# Patient Record
Sex: Female | Born: 1935 | Race: White | Hispanic: No | Smoking: Former smoker
Health system: Southern US, Community
[De-identification: ages and names within clinical notes are randomized; demographics above are authoritative.]

## PROBLEM LIST (undated history)

## (undated) DIAGNOSIS — I4892 Unspecified atrial flutter: Secondary | ICD-10-CM

## (undated) DIAGNOSIS — Z9289 Personal history of other medical treatment: Secondary | ICD-10-CM

## (undated) DIAGNOSIS — I480 Paroxysmal atrial fibrillation: Secondary | ICD-10-CM

## (undated) DIAGNOSIS — M1990 Unspecified osteoarthritis, unspecified site: Secondary | ICD-10-CM

## (undated) DIAGNOSIS — I441 Atrioventricular block, second degree: Secondary | ICD-10-CM

## (undated) DIAGNOSIS — Z780 Asymptomatic menopausal state: Secondary | ICD-10-CM

## (undated) DIAGNOSIS — I739 Peripheral vascular disease, unspecified: Secondary | ICD-10-CM

## (undated) DIAGNOSIS — E785 Hyperlipidemia, unspecified: Secondary | ICD-10-CM

## (undated) DIAGNOSIS — I2510 Atherosclerotic heart disease of native coronary artery without angina pectoris: Secondary | ICD-10-CM

## (undated) DIAGNOSIS — C50919 Malignant neoplasm of unspecified site of unspecified female breast: Secondary | ICD-10-CM

## (undated) DIAGNOSIS — K5790 Diverticulosis of intestine, part unspecified, without perforation or abscess without bleeding: Secondary | ICD-10-CM

## (undated) DIAGNOSIS — I10 Essential (primary) hypertension: Secondary | ICD-10-CM

## (undated) DIAGNOSIS — I350 Nonrheumatic aortic (valve) stenosis: Secondary | ICD-10-CM

## (undated) HISTORY — PX: HEMORROIDECTOMY: SUR656

## (undated) HISTORY — DX: Unspecified atrial flutter: I48.92

## (undated) HISTORY — DX: Atherosclerotic heart disease of native coronary artery without angina pectoris: I25.10

## (undated) HISTORY — DX: Nonrheumatic aortic (valve) stenosis: I35.0

## (undated) HISTORY — DX: Asymptomatic menopausal state: Z78.0

## (undated) HISTORY — DX: Malignant neoplasm of unspecified site of unspecified female breast: C50.919

## (undated) HISTORY — PX: EYE SURGERY: SHX253

## (undated) HISTORY — DX: Unspecified osteoarthritis, unspecified site: M19.90

## (undated) HISTORY — PX: RENAL ARTERY STENT: SHX2321

## (undated) HISTORY — DX: Diverticulosis of intestine, part unspecified, without perforation or abscess without bleeding: K57.90

## (undated) HISTORY — DX: Hyperlipidemia, unspecified: E78.5

## (undated) HISTORY — DX: Atrioventricular block, second degree: I44.1

## (undated) HISTORY — PX: ARTERIOVENOUS GRAFT PLACEMENT W/ ENDOSCOPIC VEIN HARVEST: SUR1030

## (undated) HISTORY — DX: Essential (primary) hypertension: I10

## (undated) HISTORY — DX: Paroxysmal atrial fibrillation: I48.0

## (undated) HISTORY — DX: Peripheral vascular disease, unspecified: I73.9

## (undated) HISTORY — DX: Personal history of other medical treatment: Z92.89

## (undated) HISTORY — PX: BREAST SURGERY: SHX581

---
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
## 1998-04-23 ENCOUNTER — Ambulatory Visit (HOSPITAL_COMMUNITY): Admission: RE | Admit: 1998-04-23 | Discharge: 1998-04-23 | Payer: Self-pay | Admitting: Cardiology

## 1999-02-02 ENCOUNTER — Other Ambulatory Visit: Admission: RE | Admit: 1999-02-02 | Discharge: 1999-02-02 | Payer: Self-pay | Admitting: Gastroenterology

## 2005-09-27 HISTORY — PX: AORTIC VALVE REPLACEMENT: SHX41

## 2005-09-27 HISTORY — PX: CORONARY ARTERY BYPASS GRAFT: SHX141

## 2006-02-07 ENCOUNTER — Inpatient Hospital Stay (HOSPITAL_COMMUNITY): Admission: RE | Admit: 2006-02-07 | Discharge: 2006-02-19 | Payer: Self-pay | Admitting: Cardiothoracic Surgery

## 2006-02-07 ENCOUNTER — Encounter: Payer: Self-pay | Admitting: Cardiology

## 2006-03-10 ENCOUNTER — Encounter: Admission: RE | Admit: 2006-03-10 | Discharge: 2006-03-10 | Payer: Self-pay | Admitting: Cardiothoracic Surgery

## 2006-05-05 ENCOUNTER — Encounter: Admission: RE | Admit: 2006-05-05 | Discharge: 2006-05-05 | Payer: Self-pay | Admitting: Cardiothoracic Surgery

## 2006-07-28 ENCOUNTER — Encounter: Admission: RE | Admit: 2006-07-28 | Discharge: 2006-07-28 | Payer: Self-pay | Admitting: Cardiothoracic Surgery

## 2006-09-27 HISTORY — PX: MASTECTOMY: SHX3

## 2009-05-22 ENCOUNTER — Encounter: Payer: Self-pay | Admitting: Cardiology

## 2010-10-18 ENCOUNTER — Encounter: Payer: Self-pay | Admitting: Cardiothoracic Surgery

## 2011-10-19 ENCOUNTER — Encounter: Payer: Self-pay | Admitting: Gastroenterology

## 2011-11-09 ENCOUNTER — Encounter: Payer: Self-pay | Admitting: *Deleted

## 2011-11-09 ENCOUNTER — Ambulatory Visit: Payer: Self-pay | Admitting: Gastroenterology

## 2011-11-10 ENCOUNTER — Encounter: Payer: Self-pay | Admitting: *Deleted

## 2011-11-11 ENCOUNTER — Ambulatory Visit (INDEPENDENT_AMBULATORY_CARE_PROVIDER_SITE_OTHER): Payer: Medicare Other | Admitting: Cardiology

## 2011-11-11 ENCOUNTER — Encounter: Payer: Self-pay | Admitting: Cardiology

## 2011-11-11 VITALS — BP 169/70 | HR 64 | Wt 129.0 lb

## 2011-11-11 DIAGNOSIS — R609 Edema, unspecified: Secondary | ICD-10-CM

## 2011-11-11 DIAGNOSIS — E785 Hyperlipidemia, unspecified: Secondary | ICD-10-CM | POA: Insufficient documentation

## 2011-11-11 DIAGNOSIS — R600 Localized edema: Secondary | ICD-10-CM

## 2011-11-11 DIAGNOSIS — I2510 Atherosclerotic heart disease of native coronary artery without angina pectoris: Secondary | ICD-10-CM | POA: Insufficient documentation

## 2011-11-11 DIAGNOSIS — Z8679 Personal history of other diseases of the circulatory system: Secondary | ICD-10-CM | POA: Insufficient documentation

## 2011-11-11 DIAGNOSIS — I10 Essential (primary) hypertension: Secondary | ICD-10-CM

## 2011-11-11 DIAGNOSIS — Z954 Presence of other heart-valve replacement: Secondary | ICD-10-CM

## 2011-11-11 DIAGNOSIS — Z87448 Personal history of other diseases of urinary system: Secondary | ICD-10-CM

## 2011-11-11 DIAGNOSIS — Z953 Presence of xenogenic heart valve: Secondary | ICD-10-CM

## 2011-11-11 NOTE — Assessment & Plan Note (Signed)
Stable. No indication for echocardiography. 

## 2011-11-11 NOTE — Assessment & Plan Note (Signed)
I rechecked her blood pressure was 140 over about 85. Her creatinine is stable. I doubt renal vascular intervention would be helpful. I advised her to see Dr.Early of vascular surgery as scheduled in April by her primary care physician and Dr.McComb. I will not the renal dopplers here and did not feel there indicated.

## 2011-11-11 NOTE — Patient Instructions (Addendum)
Your physician wants you to follow-up in: 12 months.  You will receive a reminder letter in the mail two  months in advance. If you don't receive a letter, please call our office to schedule the follow-up appointment.  Your physician recommends that you return for lab work on 11/22/2011 for a BMET.

## 2011-11-11 NOTE — Assessment & Plan Note (Signed)
Stable. I've advised her to continue her current meds. As noted above, she has a history of coronary bypass surgery.

## 2011-11-11 NOTE — Assessment & Plan Note (Signed)
I advised her to take her Lasix on a daily basis. This should also help control her blood pressure.

## 2011-11-11 NOTE — Progress Notes (Signed)
HPI Stephanie Cochran is a 78 year old white female who comes today to establish with me as her cardiologist. She is a former patient of Dr. Lucas Mallow. She is referred by Dr Lucas Mallow. She has a history of severe aortic stenosis and coronary artery disease and is status post aortic valve replacement with a tissue valve and coronary bypass grafting x3 by Dr.Gerhardt [place] in May of 2007. She also has a history of renal artery stenosis and has had previous stenting, unknown details. She has a history previous tobacco use, hyperlipidemia, hypertension.  She is having no angina or symptoms of ischemia. She remains quite active. She likes to travel. She has no symptoms of palpitations. She does have a history of postoperative A. Fib and flutter.  She does not take her Lasix on a daily basis. He is also on hydrochlorothiazide. Her blood pressure then running around 160-180 which as it checked outside the office. She was told by Dr. Mayford Knife that she might need another stent in her kidney.  Of note, she had recent blood work by Dr.McComb. Her creatinine was normal 8.7 a, potassium 4.0, thyroid profile normal, total cholesterol 138, triglycerides 64, HDL 48, LDL 77.  Her biggest complaint today is that of swelling ankles and feet. She takes her Lasix when this happens. Past Medical History  Diagnosis Date  . CAD (coronary artery disease)   . HTN (hypertension)   . PVD (peripheral vascular disease)   . Diverticulosis     history  . HLD (hyperlipidemia)   . Atrial fibrillation     s/p operative  . Atrial flutter     s/p operative  . Cellulitis     left forearm  . Aortic stenosis     history  . Breast cancer     left mastectomy  . Post-menopausal   . Heart disease   . Diverticulosis   . Arthritis     Current Outpatient Prescriptions  Medication Sig Dispense Refill  . amLODipine (NORVASC) 10 MG tablet Take 1 tablet by mouth Daily.      Marland Kitchen aspirin 81 MG tablet Take 81 mg by mouth daily.      .  carvedilol (COREG) 6.25 MG tablet Take 6.25 mg by mouth daily.      . clopidogrel (PLAVIX) 75 MG tablet Take 75 mg by mouth daily.      . Cyanocobalamin (B-12) 1000 MCG CAPS Take 1 capsule by mouth daily.      . furosemide (LASIX) 20 MG tablet Take 20 mg by mouth as needed.      . hydrochlorothiazide (HYDRODIURIL) 25 MG tablet Take 12.5 mg by mouth daily.       Marland Kitchen letrozole (FEMARA) 2.5 MG tablet Take 2.5 mg by mouth daily.      . rosuvastatin (CRESTOR) 20 MG tablet Take 20 mg by mouth daily.      . Vitamin D, Ergocalciferol, (DRISDOL) 50000 UNITS CAPS Take 50,000 Units by mouth every 30 (thirty) days.      . vitamin E 400 UNIT capsule Take 400 Units by mouth daily.        Allergies  Allergen Reactions  . Penicillins     Family History  Problem Relation Age of Onset  . Colon cancer Sister   . Diabetes    . Heart disease    . Arthritis      History   Social History  . Marital Status: Widowed    Spouse Name: N/A    Number of  Children: N/A  . Years of Education: N/A   Occupational History  . Not on file.   Social History Main Topics  . Smoking status: Never Smoker   . Smokeless tobacco: Never Used  . Alcohol Use: No  . Drug Use: No  . Sexually Active: Not on file   Other Topics Concern  . Not on file   Social History Narrative  . No narrative on file    ROS ALL NEGATIVE EXCEPT THOSE NOTED IN HPI  PE  General Appearance: well developed, well nourished in no acute distress, elderly, looks younger than stated age HEENT: symmetrical face, PERRLA,  Neck: no JVD, thyromegaly, or adenopathy, trachea midline Chest: symmetric without deformity Cardiac: PMI non-displaced, RRR, normal S1, S2, no gallop, soft systolic murmur, S2 splitsLung: clear to ausculation and percussion Vascular: all pulses full without bruits  Abdominal: nondistended, nontender, good bowel sounds, no HSM, no bruits Extremities: no cyanosis, clubbing, 1+ pitting edema bilaterally, no sign of DVT, no  varicosities  Skin: normal color, no rashes Neuro: alert and oriented x 3, non-focal Pysch: normal affect  EKG Normal sinus rhythm, left axis deviation BMET No results found for this basename: na, k, cl, co2, glucose, bun, creatinine, calcium, gfrnonaa, gfraa    Lipid Panel  No results found for this basename: chol, trig, hdl, cholhdl, vldl, ldlcalc    CBC No results found for this basename: wbc, rbc, hgb, hct, plt, mcv, mch, mchc, rdw, neutrabs, lymphsabs, monoabs, eosabs, basosabs

## 2011-11-11 NOTE — Assessment & Plan Note (Signed)
At goal by recent blood work. No change in treatment

## 2011-11-11 NOTE — Assessment & Plan Note (Signed)
Under good control here in the office. Encouraged to take daily Lasix.

## 2011-11-12 ENCOUNTER — Ambulatory Visit: Payer: Self-pay | Admitting: Gastroenterology

## 2011-11-22 ENCOUNTER — Other Ambulatory Visit (INDEPENDENT_AMBULATORY_CARE_PROVIDER_SITE_OTHER): Payer: Medicare Other

## 2011-11-22 DIAGNOSIS — R609 Edema, unspecified: Secondary | ICD-10-CM

## 2011-11-22 LAB — BASIC METABOLIC PANEL
BUN: 15 mg/dL (ref 6–23)
CO2: 30 mEq/L (ref 19–32)
Calcium: 8.9 mg/dL (ref 8.4–10.5)
Sodium: 141 mEq/L (ref 135–145)

## 2011-12-21 ENCOUNTER — Encounter: Payer: Self-pay | Admitting: Cardiology

## 2012-01-03 ENCOUNTER — Encounter: Payer: Self-pay | Admitting: Vascular Surgery

## 2012-01-04 ENCOUNTER — Ambulatory Visit (INDEPENDENT_AMBULATORY_CARE_PROVIDER_SITE_OTHER): Payer: Medicare Other | Admitting: Vascular Surgery

## 2012-01-04 ENCOUNTER — Encounter: Payer: Self-pay | Admitting: Vascular Surgery

## 2012-01-04 VITALS — BP 210/68 | HR 53 | Temp 97.5°F | Resp 16

## 2012-01-04 DIAGNOSIS — I701 Atherosclerosis of renal artery: Secondary | ICD-10-CM | POA: Insufficient documentation

## 2012-01-04 DIAGNOSIS — Z48812 Encounter for surgical aftercare following surgery on the circulatory system: Secondary | ICD-10-CM

## 2012-01-04 DIAGNOSIS — I10 Essential (primary) hypertension: Secondary | ICD-10-CM

## 2012-01-04 DIAGNOSIS — N270 Small kidney, unilateral: Secondary | ICD-10-CM

## 2012-01-04 NOTE — Progress Notes (Signed)
[facility]   Patient name: Stephanie Cochran MRN: [id number] DOB: 15-Jan-1918 Sex: female   Referred by: Arelia Sneddon  Reason for referral:  Chief Complaint  Patient presents with  . New Evaluation    renal artery duplex 01-04-2012    . renal stenosis    HISTORY OF PRESENT ILLNESS: The patient is very active and 78 year old white female seen today for evaluation of history of renal artery stenosis and renal artery stenting in the past. Treatment had been at a [place] [facility]. Her vascular surgeon and retired she is seeing me for further discussion. She had undergone a right renal artery angioplasty and stenting in 2006. She had a current stenosis and underwent re\re intervention and stenting in 2010. I do have duplex ultrasounds for review from August 2011 and April 2012. B. 2012 study showed some critical stenosis in the right and critical stenosis in the left renal artery. He does have a lungs standing history of hypertension. She reports that there was never any response after her angioplasties in the past. She reports that her systolic blood pressure runs in the 160-180 range.  Past Medical History  Diagnosis Date  . CAD (coronary artery disease)   . HTN (hypertension)   . PVD (peripheral vascular disease)   . Diverticulosis     history  . HLD (hyperlipidemia)   . Atrial fibrillation     s/p operative  . Atrial flutter     s/p operative  . Cellulitis     left forearm  . Aortic stenosis     history  . Breast cancer     left mastectomy  . Post-menopausal   . Heart disease   . Diverticulosis   . Arthritis     Past Surgical History  Procedure Date  . Renal artery stent 2006, 2010  . Aortic valve replacement 2007    with 21mm [facility] pericardial tissue valve   . Coronary artery bypass graft 2007    x3, left internal mammary artery to the left anterior descending, saphenous vein graft tot he circumflex coronary  . Arteriovenous graft  placement w/ endoscopic vein harvest     left leg  . Mastectomy 2008    left, breast cancer, followed by a hematology oncologist  . Tonsillectomy 1943  . Hemorroidectomy     History   Social History  . Marital Status: Widowed    Spouse Name: N/A    Number of Children: N/A  . Years of Education: N/A   Occupational History  . Not on file.   Social History Main Topics  . Smoking status: Former Smoker    Quit date: 09/27/1960  . Smokeless tobacco: Never Used  . Alcohol Use: No  . Drug Use: No  . Sexually Active: Not on file   Other Topics Concern  . Not on file   Social History Narrative  . No narrative on file    Family History  Problem Relation Age of Onset  . Colon cancer Sister   . Diabetes    . Heart disease    . Arthritis      Allergies as of 01/04/2012 - Review Complete 01/04/2012  Allergen Reaction Noted  . Codeine  01/04/2012  . Penicillins    . Vicodin (hydrocodone-acetaminophen)  01/04/2012    Current Outpatient Prescriptions on File Prior to Visit  Medication Sig Dispense Refill  . amLODipine (NORVASC) 10 MG tablet Take 1 tablet by mouth Daily.      Marland Kitchen  aspirin 81 MG tablet Take 81 mg by mouth daily.      . carvedilol (COREG) 6.25 MG tablet Take 6.25 mg by mouth daily.      . clopidogrel (PLAVIX) 75 MG tablet Take 75 mg by mouth daily.      . Cyanocobalamin (B-12) 1000 MCG CAPS Take 1 capsule by mouth daily.      . furosemide (LASIX) 20 MG tablet Take 20 mg by mouth as needed.      . hydrochlorothiazide (HYDRODIURIL) 25 MG tablet Take 12.5 mg by mouth daily.       Marland Kitchen letrozole (FEMARA) 2.5 MG tablet Take 2.5 mg by mouth daily.      . rosuvastatin (CRESTOR) 20 MG tablet Take 20 mg by mouth daily.      . Vitamin D, Ergocalciferol, (DRISDOL) 50000 UNITS CAPS Take 50,000 Units by mouth every 30 (thirty) days.      . vitamin E 400 UNIT capsule Take 400 Units by mouth daily.         REVIEW OF SYSTEMS:  Positives indicated with an  "X"  CARDIOVASCULAR:  [ ]  chest pain   [ ]  chest pressure   [ ]  palpitations   [ ]  orthopnea   [x ] dyspnea on exertion   [ ]  claudication   [ ]  rest pain   [ ]  DVT   [ ]  phlebitis PULMONARY:   [ ]  productive cough   [ ]  asthma   [ ]  wheezing NEUROLOGIC:   [ ]  weakness  [x ] paresthesias  [ ]  aphasia  [ ]  amaurosis  [x ] dizziness HEMATOLOGIC:   [ ]  bleeding problems   [ ]  clotting disorders MUSCULOSKELETAL:  [ ]  joint pain   [ ]  joint swelling GASTROINTESTINAL: [ ]   blood in stool  [ ]   hematemesis GENITOURINARY:  [ ]   dysuria  [ ]   hematuria PSYCHIATRIC:  [ ]  history of major depression INTEGUMENTARY:  [ ]  rashes  [ ]  ulcers CONSTITUTIONAL:  [ ]  fever   [ ]  chills  PHYSICAL EXAMINATION:  General: The patient is a well-nourished female, in no acute distress. Vital signs are BP 210/68  Pulse 53  Temp 97.5 F (36.4 C)  Resp 16 Pulmonary: There is a good air exchange bilaterally without wheezing or rales. Abdomen: Soft and non-tender with normal pitch bowel sounds. No abdominal bruits heard Musculoskeletal: There are no major deformities.  There is no significant extremity pain. Neurologic: No focal weakness or paresthesias are detected, Skin: There are no ulcer or rashes noted. Pulses: 2+ radial femoral and dorsalis pedis pulses bilaterally Psychiatric: The patient has normal affect. Cardiovascular: There is a regular rate and rhythm without significant murmur appreciated.   [facility] Vascular Lab Studies:  Ordered and Independently Reviewed renal duplex today reveals elevated velocities in the right renal artery stent. It is patent but there is restenosis. In the left there was less than 60% stenosis of the left renal artery.  Impression and Plan:  [name] discussion with the patient. Did review her labs from approximately 2 months ago revealing a creatinine of 0.76. Her renal size is equal bilaterally. I explained in our practice is always been very conservative in treatment of renal  artery stenosis. I also explained that ligature supporting this more recently in that there has been a trend nationally poor more conservative treatment as well. I certainly would not recommend any re\re intervention in her right renal artery with normal renal function and stable essential  hypertension. I would not recommend any further followup duplex since we would not make any treatment recommendations based on this. The patient understands and will see [place] should she develop any renal insufficiency or other vascular problems    Keriann Rankin [facility] [place] Office: [phone]

## 2012-01-20 NOTE — Procedures (Unsigned)
RENAL ARTERY DUPLEX EVALUATION  INDICATION:  Renal artery stenosis, hypertension  HISTORY: Diabetes:  No Cardiac:  CAD, AV replacement Hypertension:  Yes Smoking:  No  RENAL ARTERY DUPLEX FINDINGS: Aorta-Proximal:  95 cm/s Aorta-Mid:  104 cm/s Aorta-Distal:  136 cm/s Celiac Artery Origin:  466 cm/s SMA Origin:  366 cm/s                                   RIGHT               LEFT Renal Artery Origin:             270 cm/s            170 cm/s Renal Artery Proximal:           211 cm/s            207 cm/s Renal Artery Mid:                172 cm/s            199 cm/s Renal Artery Distal:             100 cm/s            101 cm/s Hilar Acceleration Time (AT): Renal-Aortic Ratio (RAR):        2.8                 2.2 Kidney Size:                     11.2 cm             11.4 cm End Diastolic Ratio (EDR): Resistive Index (RI):            0.86                0.81  IMPRESSION: 1. Patent abdominal aorta with mild tortuosity and minimal     heterogeneous plaque present. 2. Elevated velocities present involving the celiac and SMA arteries     which is suggestive of greater than 75% stenosis. 3. Patent right renal artery stent with elevated velocities present.     No significant plaque formation identified. 4. Left renal artery patent with a ratio of less than 3.5 suggestive     of <60% stenosis. 5. Patent bilateral renal veins present.  ___________________________________________ Larina Earthly, M.D.  SH/MEDQ  D:  01/04/2012  T:  01/04/2012  Job:  [id number]

## 2012-08-28 ENCOUNTER — Telehealth: Payer: Self-pay | Admitting: Gastroenterology

## 2012-08-29 NOTE — Telephone Encounter (Signed)
Pt last seen for COLON 01/06/2004 that was normal except for diverticulosis. She had 2 appts with Dr Jarold Motto scheduled in 10/2011 that she cancelled because she was in [place] taking care of her sister. She lived in [place] for the last few years, but has moved back here now. She did have a COLON in [place] in the past few years, and the doctor said he would not do anymore d/t her age. Pt reports thin stools that resembled tiny worms yesterday that were black; today she states her BM was normal. Dr Jarold Motto, OK to schedule pt with [facility] even though she saw GI doc in the past years when she resided there? Thanks.

## 2012-08-29 NOTE — Telephone Encounter (Signed)
Informed pt Dr Jarold Motto prefers she see her PCP for her problem; pt stated understanding.

## 2012-08-29 NOTE — Telephone Encounter (Signed)
No...needs to see her doc at age 78

## 2012-12-01 ENCOUNTER — Ambulatory Visit: Payer: Medicare Other | Admitting: Cardiology

## 2012-12-05 ENCOUNTER — Ambulatory Visit (INDEPENDENT_AMBULATORY_CARE_PROVIDER_SITE_OTHER): Payer: Medicare Other | Admitting: Cardiology

## 2012-12-05 ENCOUNTER — Encounter: Payer: Self-pay | Admitting: Cardiology

## 2012-12-05 VITALS — BP 132/66 | HR 53 | Ht 61.0 in | Wt 125.0 lb

## 2012-12-05 NOTE — Patient Instructions (Addendum)
Continue taking your current medication as prescribed  Your physician recommends that you schedule a follow-up appointment in: 1 year.

## 2012-12-05 NOTE — Assessment & Plan Note (Signed)
Stable by history and exam. No indication for echocardiogram. Continue medical therapy.

## 2012-12-05 NOTE — Progress Notes (Signed)
HPI Stephanie Cochran returns today for evaluation and management of her history of coronary artery disease, bypass surgery, and history of aortic valve replacement. She also has renal artery disease and has had stenting. She remains on dual antiplatelet therapy.  She's having no angina or ischemic symptoms. She denies any significant dyspnea on exertion. Mobility is limited by back pain. She's had no presyncope or syncope.  Past Medical History  Diagnosis Date  . CAD (coronary artery disease)   . HTN (hypertension)   . PVD (peripheral vascular disease)   . Diverticulosis     history  . HLD (hyperlipidemia)   . Atrial fibrillation     s/p operative  . Atrial flutter     s/p operative  . Cellulitis     left forearm  . Aortic stenosis     history  . Breast cancer     left mastectomy  . Post-menopausal   . Heart disease   . Diverticulosis   . Arthritis     Current Outpatient Prescriptions  Medication Sig Dispense Refill  . amLODipine (NORVASC) 10 MG tablet Take 1 tablet by mouth Daily.      Marland Kitchen aspirin 81 MG tablet Take 81 mg by mouth daily.      . carvedilol (COREG) 6.25 MG tablet Take 6.25 mg by mouth daily.      . clopidogrel (PLAVIX) 75 MG tablet Take 75 mg by mouth daily.      . Cyanocobalamin (B-12) 1000 MCG CAPS Take 1 capsule by mouth daily.      . furosemide (LASIX) 20 MG tablet Take 20 mg by mouth as needed.      . hydrochlorothiazide (HYDRODIURIL) 25 MG tablet Take 12.5 mg by mouth daily.       Marland Kitchen letrozole (FEMARA) 2.5 MG tablet Take 2.5 mg by mouth daily.      . rosuvastatin (CRESTOR) 20 MG tablet Take 20 mg by mouth daily.      . Vitamin D, Ergocalciferol, (DRISDOL) 50000 UNITS CAPS Take 50,000 Units by mouth every 30 (thirty) days.      . vitamin E 400 UNIT capsule Take 400 Units by mouth daily.       No current facility-administered medications for this visit.    Allergies  Allergen Reactions  . Codeine   . Penicillins   . Vicodin (Hydrocodone-Acetaminophen)      Family History  Problem Relation Age of Onset  . Colon cancer Sister   . Diabetes    . Heart disease    . Arthritis      History   Social History  . Marital Status: Widowed    Spouse Name: N/A    Number of Children: N/A  . Years of Education: N/A   Occupational History  . Not on file.   Social History Main Topics  . Smoking status: Former Smoker    Quit date: 09/27/1960  . Smokeless tobacco: Never Used  . Alcohol Use: No  . Drug Use: No  . Sexually Active: Not on file   Other Topics Concern  . Not on file   Social History Narrative  . No narrative on file    ROS ALL NEGATIVE EXCEPT THOSE NOTED IN HPI  PE  General Appearance: well developed, well nourished in no acute distress HEENT: symmetrical face, PERRLA, good dentition  Neck: no JVD, thyromegaly, or adenopathy, trachea midline Chest: symmetric without deformity Cardiac: PMI non-displaced, RRR, normal S1, S2, no gallop, soft systolic murmur, S2 splits Lung: clear  to ausculation and percussion Vascular: Pulses diminished in the lower extremities but present,  Abdominal: nondistended, nontender, good bowel sounds, no HSM, no bruits Extremities: no cyanosis, clubbing or edema, no sign of DVT, no varicosities  Skin: normal color, no rashes Neuro: alert and oriented x 3, non-focal Pysch: normal affect  EKG  Sinus bradycardia with first-degree AV block. No acute changes.  BMET    Component Value Date/Time   NA 141 11/22/2011 1131   K 4.6 11/22/2011 1131   CL 107 11/22/2011 1131   CO2 30 11/22/2011 1131   GLUCOSE 97 11/22/2011 1131   BUN 15 11/22/2011 1131   CREATININE 0.7 11/22/2011 1131   CALCIUM 8.9 11/22/2011 1131    Lipid Panel  No results found for this basename: chol, trig, hdl, cholhdl, vldl, ldlcalc    CBC No results found for this basename: wbc, rbc, hgb, hct, plt, mcv, mch, mchc, rdw, neutrabs, lymphsabs, monoabs, eosabs, basosabs

## 2012-12-05 NOTE — Assessment & Plan Note (Signed)
Blood pressure under relatively good control stable renal function. Continue medical therapy as outlined by Dr.Early.

## 2012-12-05 NOTE — Assessment & Plan Note (Signed)
Stable. Continue medical therapy. Return the office in one year.

## 2013-03-18 ENCOUNTER — Ambulatory Visit: Payer: Medicare Other

## 2013-03-18 ENCOUNTER — Ambulatory Visit (INDEPENDENT_AMBULATORY_CARE_PROVIDER_SITE_OTHER): Payer: Medicare Other | Admitting: Family Medicine

## 2013-03-18 VITALS — BP 138/55 | HR 77 | Temp 98.3°F | Resp 18 | Wt 124.0 lb

## 2013-03-18 DIAGNOSIS — J209 Acute bronchitis, unspecified: Secondary | ICD-10-CM

## 2013-03-18 DIAGNOSIS — D649 Anemia, unspecified: Secondary | ICD-10-CM

## 2013-03-18 DIAGNOSIS — R05 Cough: Secondary | ICD-10-CM

## 2013-03-18 LAB — POCT CBC
Granulocyte percent: 46.7 %G (ref 37–80)
HCT, POC: 34.6 % — AB (ref 37.7–47.9)
Hemoglobin: 10.9 g/dL — AB (ref 12.2–16.2)
MID (cbc): 0.6 (ref 0–0.9)
MPV: 7.3 fL (ref 0–99.8)
POC Granulocyte: 2.1 (ref 2–6.9)
POC MID %: 13.9 %M — AB (ref 0–12)
Platelet Count, POC: 207 10*3/uL (ref 142–424)
RBC: 3.31 M/uL — AB (ref 4.04–5.48)

## 2013-03-18 MED ORDER — LEVOFLOXACIN 500 MG PO TABS: 500.0000 mg | ORAL_TABLET | Freq: Every day | ORAL | Status: DC

## 2013-03-18 NOTE — Progress Notes (Signed)
[address]   [place], [place]  [address]   [phone]  Subjective:    Patient ID: Stephanie Cochran, female    DOB: December 04, 1917, 79 y.o.   MRN: [id number]  HPI This 79 y.o. female presents for evaluation of cough.  Onset this week four days ago.  No fever but +sweats at night.  Mild headache.  +scratchy throat but not sore.  No ear pain.  No nasal congestion; +rhinorrhea drippy.  +coughing; history of pneumonia 07/2012, 10/2012.  Evaluated by [facility] today; recommended CXR.  Scant sputum production; stringy sputum.  Just returned from [place].  +DOE. No v/d.  No fatigue or dizziness.  Has not taken anything for cough.   Review of Systems  Constitutional: Positive for diaphoresis. Negative for fever, chills and fatigue.  HENT: Positive for rhinorrhea and voice change. Negative for ear pain, congestion, sore throat, sneezing, trouble swallowing and postnasal drip.   Respiratory: Positive for cough and shortness of breath. Negative for wheezing and stridor.   Gastrointestinal: Negative for nausea, vomiting, abdominal pain and diarrhea.  Skin: Negative for rash.  Neurological: Negative for dizziness, light-headedness and headaches.    Past Medical History  Diagnosis Date  . CAD (coronary artery disease)   . HTN (hypertension)   . PVD (peripheral vascular disease)   . Diverticulosis     history  . HLD (hyperlipidemia)   . Atrial fibrillation     s/p operative  . Atrial flutter     s/p operative  . Cellulitis     left forearm  . Aortic stenosis     history  . Breast cancer     left mastectomy  . Post-menopausal   . Heart disease   . Diverticulosis   . Arthritis     Past Surgical History  Procedure Laterality Date  . Renal artery stent  2006, 2010  . Aortic valve replacement  2007    with 21mm [facility] pericardial tissue valve   . Coronary artery bypass graft  2007    x3, left internal mammary artery to the left anterior descending, saphenous vein graft tot he  circumflex coronary  . Arteriovenous graft placement w/ endoscopic vein harvest      left leg  . Mastectomy  2008    left, breast cancer, followed by a hematology oncologist  . Tonsillectomy  1943  . Hemorroidectomy    . Breast surgery    . Eye surgery    . Cardiac valve replacement      Prior to Admission medications   Medication Sig Start Date End Date Taking? Authorizing Provider  amLODipine (NORVASC) 10 MG tablet Take 1 tablet by mouth Daily. 11/03/11  Yes Historical Provider, MD  aspirin 81 MG tablet Take 81 mg by mouth daily.   Yes Historical Provider, MD  carvedilol (COREG) 6.25 MG tablet Take 6.25 mg by mouth daily.   Yes Historical Provider, MD  clopidogrel (PLAVIX) 75 MG tablet Take 75 mg by mouth daily.   Yes Historical Provider, MD  Cyanocobalamin (B-12) 1000 MCG CAPS Take 1 capsule by mouth daily.   Yes Historical Provider, MD  furosemide (LASIX) 20 MG tablet Take 20 mg by mouth as needed.   Yes Historical Provider, MD  hydrochlorothiazide (HYDRODIURIL) 25 MG tablet Take 12.5 mg by mouth daily.    Yes Historical Provider, MD  letrozole (FEMARA) 2.5 MG tablet Take 2.5 mg by mouth daily.   Yes Historical Provider, MD  rosuvastatin (CRESTOR) 20 MG tablet  Take 20 mg by mouth daily.   Yes Historical Provider, MD  Vitamin D, Ergocalciferol, (DRISDOL) 50000 UNITS CAPS Take 50,000 Units by mouth every 30 (thirty) days.   Yes Historical Provider, MD  vitamin E 400 UNIT capsule Take 400 Units by mouth daily.   Yes Historical Provider, MD  levofloxacin (LEVAQUIN) 500 MG tablet Take 1 tablet (500 mg total) by mouth daily. 03/18/13   Ethelda Chick, MD    Allergies  Allergen Reactions  . Codeine   . Penicillins   . Vicodin (Hydrocodone-Acetaminophen)     History   Social History  . Marital Status: Widowed    Spouse Name: N/A    Number of Children: N/A  . Years of Education: N/A   Occupational History  . Not on file.   Social History Main Topics  . Smoking status: Former  Smoker    Quit date: 09/27/1960  . Smokeless tobacco: Never Used  . Alcohol Use: No  . Drug Use: No  . Sexually Active: Not on file   Other Topics Concern  . Not on file   Social History Narrative  . No narrative on file    Family History  Problem Relation Age of Onset  . Colon cancer Sister   . Diabetes    . Heart disease    . Arthritis         Objective:   Physical Exam  Nursing note and vitals reviewed. Constitutional: She is oriented to person, place, and time. She appears well-developed and well-nourished. No distress.  HENT:  Head: Normocephalic and atraumatic.  Right Ear: External ear normal.  Left Ear: External ear normal.  Nose: Nose normal.  Mouth/Throat: Oropharynx is clear and moist.  Eyes: Conjunctivae are normal. Pupils are equal, round, and reactive to light.  Neck: Normal range of motion. Neck supple. No thyromegaly present.  Cardiovascular: Normal rate and regular rhythm.   Murmur heard. Pulmonary/Chest: Effort normal. She has no wheezes. She has rhonchi. She has no rales.  Lymphadenopathy:    She has no cervical adenopathy.  Neurological: She is alert and oriented to person, place, and time.  Skin: Skin is warm and dry. No rash noted. She is not diaphoretic.  Psychiatric: She has a normal mood and affect. Her behavior is normal.          Results for orders placed in visit on 03/18/13  POCT CBC      Result Value Range   WBC 4.4 (*) 4.6 - 10.2 K/uL   Lymph, poc 1.7  0.6 - 3.4   POC LYMPH PERCENT 39.4  10 - 50 %L   MID (cbc) 0.6  0 - 0.9   POC MID % 13.9 (*) 0 - 12 %M   POC Granulocyte 2.1  2 - 6.9   Granulocyte percent 46.7  37 - 80 %G   RBC 3.31 (*) 4.04 - 5.48 M/uL   Hemoglobin 10.9 (*) 12.2 - 16.2 g/dL   HCT, POC 14.7 (*) 82.9 - 47.9 %   MCV 104.5 (*) 80 - 97 fL   MCH, POC 32.9 (*) 27 - 31.2 pg   MCHC 31.5 (*) 31.8 - 35.4 g/dL   RDW, POC 56.2     Platelet Count, POC 207  142 - 424 K/uL   MPV 7.3  0 - 99.8 fL   [facility] reading  (PRIMARY) by  Dr. Katrinka Blazing. CXR: NAD; +sternotomy wires.   Assessment & Plan:  Cough - Plan: DG Chest 2 View, POCT  CBC  Acute bronchitis - Plan: levofloxacin (LEVAQUIN) 500 MG tablet  Anemia   1.  Acute Bronchitis:  New.  With history of pneumonia twice in past six months; rx for Levaquin provided.  Start Mucinex. 2.  Anemia: New to this provider; not sure if new for patient; asymptomatic; recommend follow-up with PCP and to clarify if she has suffered with anemia in the past.  Meds ordered this encounter  Medications  . levofloxacin (LEVAQUIN) 500 MG tablet    Sig: Take 1 tablet (500 mg total) by mouth daily.    Dispense:  7 tablet    Refill:  0

## 2013-12-18 ENCOUNTER — Ambulatory Visit: Payer: Medicare Other | Admitting: Cardiology

## 2014-04-22 ENCOUNTER — Encounter: Payer: Self-pay | Admitting: Physician Assistant

## 2014-04-22 ENCOUNTER — Ambulatory Visit
Admission: RE | Admit: 2014-04-22 | Discharge: 2014-04-22 | Disposition: A | Discharge status: Home / Self Care | Payer: Medicare Other | Source: Ambulatory Visit | Attending: Physician Assistant | Admitting: Physician Assistant

## 2014-04-22 ENCOUNTER — Ambulatory Visit (INDEPENDENT_AMBULATORY_CARE_PROVIDER_SITE_OTHER): Payer: Medicare Other | Admitting: Physician Assistant

## 2014-04-22 VITALS — BP 163/60 | HR 52 | Ht 61.0 in | Wt 119.1 lb

## 2014-04-22 DIAGNOSIS — R0602 Shortness of breath: Secondary | ICD-10-CM

## 2014-04-22 DIAGNOSIS — I10 Essential (primary) hypertension: Secondary | ICD-10-CM

## 2014-04-22 DIAGNOSIS — Z87448 Personal history of other diseases of urinary system: Secondary | ICD-10-CM

## 2014-04-22 DIAGNOSIS — E785 Hyperlipidemia, unspecified: Secondary | ICD-10-CM

## 2014-04-22 DIAGNOSIS — I2510 Atherosclerotic heart disease of native coronary artery without angina pectoris: Secondary | ICD-10-CM

## 2014-04-22 DIAGNOSIS — Z953 Presence of xenogenic heart valve: Secondary | ICD-10-CM

## 2014-04-22 DIAGNOSIS — Z952 Presence of prosthetic heart valve: Secondary | ICD-10-CM

## 2014-04-22 DIAGNOSIS — Z8679 Personal history of other diseases of the circulatory system: Secondary | ICD-10-CM

## 2014-04-22 LAB — D-DIMER, QUANTITATIVE: D-Dimer, Quant: 1.37 ug/mL-FEU — ABNORMAL HIGH (ref 0.00–0.48)

## 2014-04-22 MED ORDER — OLMESARTAN MEDOXOMIL 20 MG PO TABS: 20.0000 mg | ORAL_TABLET | Freq: Every day | ORAL | Status: DC

## 2014-04-22 MED ORDER — ISOSORBIDE MONONITRATE ER 30 MG PO TB24: 15.0000 mg | ORAL_TABLET | Freq: Every day | ORAL | Status: DC

## 2014-04-22 NOTE — Patient Instructions (Signed)
Your physician has recommended you make the following change in your medication: 1. Start Imdur 15 MG 1 tablet daily  Your physician recommends that you go to the lab today for a D-Dimer, BMET,CBC,TSH, and BNP  Your physician has requested that you have an echocardiogram. Echocardiography is a painless test that uses sound waves to create images of your heart. It provides your doctor with information about the size and shape of your heart and how well your heart's chambers and valves are working. This procedure takes approximately one hour. There are no restrictions for this procedure.  Your physician has requested that you have a lexiscan myoview. For further information please visit [email or web address]. Please follow instruction sheet, as given.  A chest x-ray takes a picture of the organs and structures inside the chest, including the heart, lungs, and blood vessels. This test can show several things, including, whether the heart is enlarges; whether fluid is building up in the lungs; and whether pacemaker / defibrillator leads are still in place. (This will be done at [facility] in the [employer]. It is a Walk in facility. You do not need appt.)  Your physician recommends that you schedule a follow-up appointment in: 3 weeks with Dr Meda Coffee or on a day with Nicki Reaper with Dr Meda Coffee in office.

## 2014-04-22 NOTE — Progress Notes (Signed)
Cardiology Office Note    Date:  04/22/2014   ID:  Stephanie Cochran, DOB Mar 24, 1918, MRN [id number]  PCP:  Darlyn Chamber, MD  Cardiologist:  Dr. Jenell Milliner >>> Dr. Ena Dawley      History of Present Illness: Stephanie Cochran is a 80 y.o. female with a hx of CAD and severe AS s/p CABG  X 3 and bioprosthetic AVR in 01/2006 (Dr. Servando Snare in [facility]), RAS s/p prior stenting, HTN, HL, tobacco abuse.  Previously followed by Dr. Pauline Aus.  Established with Dr. Jenell Milliner in 2013.  Last seen in 11/2012.  She returns for evaluation of dyspnea.  She was in [place] on vacation 1 month ago.  She started to feel short of breath with certain activities while there.  She is probably NYHA 2b-3.  She denies any worsening symptoms.  She denies syncope. She denies chest pain.  She sleeps on an incline chronically without change.  She denies PND.  She has stable LE edema without significant change.  She denies wheezing.  She has a cough with scant yellow sputum production.  No hemoptysis.  No fever.     Studies:  - Echo (04/2009 - [facility] in [place], [place]):  Mild LVH, normal LVF, anterior WMA, AVR ok (mean 4 mmHg), MAC, mild MR, mild TV stenosis, RVSP 44 mmHg  - Abdominal US (4/13):  Celiac and SMA velocities elevated (> 75% stenosis)  [facility] Screen done 01/2014 (records brought in by patient):  Carotid US with bilateral < 50% stenosis, abdominal US neg for AAA, ABIs with R 0.8, L 0.82   Recent Labs: No results found for requested labs within last 365 days.  Wt Readings from Last 3 Encounters:  03/18/13 124 lb (56.246 kg)  12/05/12 125 lb (56.7 kg)  11/11/11 129 lb (58.514 kg)     Past Medical History  Diagnosis Date  . CAD (coronary artery disease)     s/p CABG 2007 at [facility]  . HTN (hypertension)   . PVD (peripheral vascular disease)   . Diverticulosis     history  . HLD (hyperlipidemia)   . Atrial fibrillation     post operative  . Atrial flutter     post operative  . Cellulitis       left forearm  . Aortic stenosis     a. s/p bioprosthetic AVR 2007 [facility]);  b. Echo (04/2009 - [facility] in [place], [place]):  Mild LVH, normal LVF, anterior WMA, AVR ok (mean 4 mmHg), MAC, mild MR, mild TV stenosis, RVSP 44 mmHg  . Breast cancer     left mastectomy  . Post-menopausal   . Arthritis     Current Outpatient Prescriptions  Medication Sig Dispense Refill  . amLODipine (NORVASC) 10 MG tablet Take 1 tablet by mouth Daily.      Marland Kitchen aspirin 81 MG tablet Take 81 mg by mouth daily.      . carvedilol (COREG) 6.25 MG tablet Take 6.25 mg by mouth daily.      . clopidogrel (PLAVIX) 75 MG tablet Take 75 mg by mouth daily.      . Cyanocobalamin (B-12) 1000 MCG CAPS Take 1 capsule by mouth daily.      . furosemide (LASIX) 20 MG tablet Take 20 mg by mouth as needed.      . hydrochlorothiazide (HYDRODIURIL) 25 MG tablet Take 12.5 mg by mouth daily.       Marland Kitchen letrozole (FEMARA) 2.5 MG tablet Take 2.5 mg  by mouth daily.      Marland Kitchen levofloxacin (LEVAQUIN) 500 MG tablet Take 1 tablet (500 mg total) by mouth daily.  7 tablet  0  . rosuvastatin (CRESTOR) 20 MG tablet Take 20 mg by mouth daily.      . Vitamin D, Ergocalciferol, (DRISDOL) 50000 UNITS CAPS Take 50,000 Units by mouth every 30 (thirty) days.      . vitamin E 400 UNIT capsule Take 400 Units by mouth daily.       No current facility-administered medications for this visit.    Allergies:   Codeine; Penicillins; and Vicodin   Social History:  The patient  reports that she quit smoking about 53 years ago. She has never used smokeless tobacco. She reports that she does not drink alcohol or use illicit drugs.   Family History:  The patient's family history includes Arthritis in an other family member; Colon cancer in her sister; Diabetes in an other family member; Heart disease in an other family member.   ROS:  Please see the history of present illness.      All other systems reviewed and negative.   PHYSICAL EXAM: VS:  BP 163/60  Pulse  52  Ht 5\' 1"  (1.549 m)  Wt 119 lb 1.9 oz (54.032 kg)  BMI 22.52 kg/m2  SpO2 98% Well nourished, well developed, in no acute distress HEENT: normal Neck: no JVD Cardiac:  normal S1, S2; RRR; 2/6 systolic murmurRUSB Lungs:  clear to auscultation bilaterally, no wheezing, rhonchi or rales Abd: soft, nontender, no hepatomegaly Ext: trace to 1+ bilateral ankle edema Skin: warm and dry Neuro:  CNs 2-12 intact, no focal abnormalities noted  EKG:  Sinus brady, HR 52, LAD, 1st degree AVB, no change from prior tracing    ASSESSMENT AND PLAN:  SOB (shortness of breath):  Etiology not entirely clear.  Differential Dx is extensive.  Exam does not suggest significantly worsening aortic stenosis or significant volume excess.  No chest pain but dyspnea may be an anginal equivalent.  She developed symptoms after a long [place] flight.  She is a vibrant 80 year old who still lives alone and remains very active.  She would certainly be a candidate for cardiac cath if it was warranted.    -  Check BMET, BNP, CBC, TSH, DDimer.  If DDimer elevated, she will need Chest CTA to rule PE.   -  Arrange CXR   -  Arrange F/u Echo to assess AV gradient and LVF.   -  Arrange Lexiscan Myoview to assess for ischemia.     -  Add Imdur 15 mg QD to initiate medical Rx for CAD.  Coronary artery disease:  Arrange Myoview and start Isosorbide as noted.  Continue ASA, beta blocker.  She is not on statin Rx for unclear reasons.  We can address this at follow up.  History of aortic valve replacement with bioprosthetic valve - Plan: 2D Echocardiogram without contrast  History of renal artery stenosis:  Previously followed by [facility].  If creatinine elevated, consider repeat US.    Essential hypertension:  BP elevated.  Add Nitrates as noted.  Monitor.   Hyperlipidemia:  Will revisit at follow up and start statin if no contraindication.     Disposition:  F/u with Dr. Ena Dawley or me in 3 weeks.     Signed, [name], MHS 04/22/2014 2:25 PM    [facility] [address], [place], [place]  [address] Phone: [phone];  Fax: [phone]

## 2014-04-23 ENCOUNTER — Telehealth: Payer: Self-pay | Admitting: *Deleted

## 2014-04-23 ENCOUNTER — Ambulatory Visit (INDEPENDENT_AMBULATORY_CARE_PROVIDER_SITE_OTHER)
Admission: RE | Admit: 2014-04-23 | Discharge: 2014-04-23 | Disposition: A | Discharge status: Home / Self Care | Payer: Medicare Other | Source: Ambulatory Visit | Attending: Physician Assistant | Admitting: Physician Assistant

## 2014-04-23 DIAGNOSIS — R7989 Other specified abnormal findings of blood chemistry: Secondary | ICD-10-CM

## 2014-04-23 DIAGNOSIS — R0602 Shortness of breath: Secondary | ICD-10-CM

## 2014-04-23 DIAGNOSIS — R791 Abnormal coagulation profile: Secondary | ICD-10-CM

## 2014-04-23 LAB — CBC
HCT: 34.9 % — ABNORMAL LOW (ref 36.0–46.0)
HEMOGLOBIN: 11.7 g/dL — AB (ref 12.0–15.0)
MCHC: 33.5 g/dL (ref 30.0–36.0)
MCV: 103 fl — ABNORMAL HIGH (ref 78.0–100.0)
PLATELETS: 214 10*3/uL (ref 150.0–400.0)
RBC: 3.39 Mil/uL — AB (ref 3.87–5.11)
RDW: 13.9 % (ref 11.5–15.5)
WBC: 5.7 10*3/uL (ref 4.0–10.5)

## 2014-04-23 LAB — TSH: TSH: 3.79 u[IU]/mL (ref 0.35–4.50)

## 2014-04-23 LAB — BASIC METABOLIC PANEL
BUN: 23 mg/dL (ref 6–23)
CHLORIDE: 97 meq/L (ref 96–112)
CO2: 27 mEq/L (ref 19–32)
CREATININE: 1 mg/dL (ref 0.4–1.2)
Calcium: 9.3 mg/dL (ref 8.4–10.5)
GFR: 58.15 mL/min — AB (ref >60.00)
GLUCOSE: 112 mg/dL — AB (ref 70–99)
POTASSIUM: 4.8 meq/L (ref 3.5–5.1)
Sodium: 131 mEq/L — ABNORMAL LOW (ref 135–145)

## 2014-04-23 LAB — BRAIN NATRIURETIC PEPTIDE: Pro B Natriuretic peptide (BNP): 255 pg/mL — ABNORMAL HIGH (ref 0.0–100.0)

## 2014-04-23 MED ORDER — IOHEXOL 350 MG/ML SOLN
70.0000 mL | Freq: Once | INTRAVENOUS | Status: AC | PRN
  Administered 2014-04-23: 70 mL via INTRAVENOUS

## 2014-04-23 NOTE — Telephone Encounter (Signed)
pt notified about lab results also with elevated d-dimer 1.37. Pt scheduled today for Chest Ct to r/o PE. Pt's neice also notified since pt's daughter lives out of town and neice brings her to appts. Rose in [place] scheduled pt for 2 pm today, pt aware NPO after 12 noon today.

## 2014-04-24 ENCOUNTER — Telehealth: Payer: Self-pay | Admitting: *Deleted

## 2014-04-24 DIAGNOSIS — R600 Localized edema: Secondary | ICD-10-CM

## 2014-04-24 MED ORDER — FUROSEMIDE 20 MG PO TABS: ORAL_TABLET | ORAL | Status: DC

## 2014-04-24 NOTE — Telephone Encounter (Signed)
ptcb and has been notified about all test results with verbal understanding. Pt aware to start lasix 20 mg 3 x weekly, will get bmet 8/12 when she comes back from vacation.

## 2014-04-24 NOTE — Telephone Encounter (Signed)
pt notified about all test results with verbal understanding and to start taking lasix 20 mg 3 days a week. Pt said she will get bmet 8/12 when she is back from vacation.

## 2014-04-24 NOTE — Telephone Encounter (Signed)
lmptcb for test results from lab, cxr and chest ct

## 2014-04-25 ENCOUNTER — Encounter: Payer: Medicare Other | Admitting: Cardiology

## 2014-05-01 ENCOUNTER — Encounter: Payer: Medicare Other | Admitting: Cardiology

## 2014-05-07 ENCOUNTER — Other Ambulatory Visit (INDEPENDENT_AMBULATORY_CARE_PROVIDER_SITE_OTHER): Payer: Medicare Other

## 2014-05-07 DIAGNOSIS — R609 Edema, unspecified: Secondary | ICD-10-CM

## 2014-05-07 DIAGNOSIS — R600 Localized edema: Secondary | ICD-10-CM

## 2014-05-07 LAB — BASIC METABOLIC PANEL
BUN: 21 mg/dL (ref 6–23)
CALCIUM: 8.9 mg/dL (ref 8.4–10.5)
CO2: 22 mEq/L (ref 19–32)
CREATININE: 1.1 mg/dL (ref 0.4–1.2)
Chloride: 99 mEq/L (ref 96–112)
GFR: 50.69 mL/min — AB (ref >60.00)
Glucose, Bld: 105 mg/dL — ABNORMAL HIGH (ref 70–99)
Potassium: 3.7 mEq/L (ref 3.5–5.1)
Sodium: 133 mEq/L — ABNORMAL LOW (ref 135–145)

## 2014-05-08 ENCOUNTER — Telehealth: Payer: Self-pay | Admitting: *Deleted

## 2014-05-08 NOTE — Telephone Encounter (Signed)
pt notified about lab results with verbal understanding and asked for results to be mailed to her today.

## 2014-05-13 ENCOUNTER — Encounter (HOSPITAL_COMMUNITY): Payer: Self-pay | Admitting: Emergency Medicine

## 2014-05-13 ENCOUNTER — Emergency Department (HOSPITAL_COMMUNITY): Payer: Medicare Other

## 2014-05-13 ENCOUNTER — Ambulatory Visit (HOSPITAL_BASED_OUTPATIENT_CLINIC_OR_DEPARTMENT_OTHER): Payer: Medicare Other | Admitting: Cardiology

## 2014-05-13 ENCOUNTER — Inpatient Hospital Stay (HOSPITAL_COMMUNITY)
Admission: EM | Admit: 2014-05-13 | Discharge: 2014-05-15 | DRG: 244 | Disposition: A | Discharge status: Home / Self Care | Payer: Medicare Other | Attending: Cardiovascular Disease | Admitting: Cardiovascular Disease

## 2014-05-13 ENCOUNTER — Encounter: Payer: Self-pay | Admitting: Cardiology

## 2014-05-13 ENCOUNTER — Ambulatory Visit (HOSPITAL_COMMUNITY): Payer: Medicare Other | Admitting: Radiology

## 2014-05-13 VITALS — Ht 61.0 in | Wt 118.0 lb

## 2014-05-13 DIAGNOSIS — I10 Essential (primary) hypertension: Secondary | ICD-10-CM | POA: Diagnosis present

## 2014-05-13 DIAGNOSIS — I441 Atrioventricular block, second degree: Principal | ICD-10-CM | POA: Diagnosis present

## 2014-05-13 DIAGNOSIS — Z87891 Personal history of nicotine dependence: Secondary | ICD-10-CM | POA: Diagnosis not present

## 2014-05-13 DIAGNOSIS — R0602 Shortness of breath: Secondary | ICD-10-CM

## 2014-05-13 DIAGNOSIS — D649 Anemia, unspecified: Secondary | ICD-10-CM | POA: Diagnosis present

## 2014-05-13 DIAGNOSIS — Z7982 Long term (current) use of aspirin: Secondary | ICD-10-CM

## 2014-05-13 DIAGNOSIS — I442 Atrioventricular block, complete: Secondary | ICD-10-CM | POA: Diagnosis present

## 2014-05-13 DIAGNOSIS — Z7902 Long term (current) use of antithrombotics/antiplatelets: Secondary | ICD-10-CM

## 2014-05-13 DIAGNOSIS — C50919 Malignant neoplasm of unspecified site of unspecified female breast: Secondary | ICD-10-CM | POA: Diagnosis present

## 2014-05-13 DIAGNOSIS — Z951 Presence of aortocoronary bypass graft: Secondary | ICD-10-CM

## 2014-05-13 DIAGNOSIS — Z79899 Other long term (current) drug therapy: Secondary | ICD-10-CM

## 2014-05-13 DIAGNOSIS — I2510 Atherosclerotic heart disease of native coronary artery without angina pectoris: Secondary | ICD-10-CM

## 2014-05-13 DIAGNOSIS — Z9010 Acquired absence of unspecified breast and nipple: Secondary | ICD-10-CM | POA: Diagnosis not present

## 2014-05-13 DIAGNOSIS — Z952 Presence of prosthetic heart valve: Secondary | ICD-10-CM | POA: Diagnosis not present

## 2014-05-13 DIAGNOSIS — E785 Hyperlipidemia, unspecified: Secondary | ICD-10-CM | POA: Diagnosis present

## 2014-05-13 DIAGNOSIS — Z953 Presence of xenogenic heart valve: Secondary | ICD-10-CM

## 2014-05-13 DIAGNOSIS — Z954 Presence of other heart-valve replacement: Secondary | ICD-10-CM

## 2014-05-13 LAB — URINE MICROSCOPIC-ADD ON

## 2014-05-13 LAB — PRO B NATRIURETIC PEPTIDE: PRO B NATRI PEPTIDE: 1155 pg/mL — AB (ref 0–450)

## 2014-05-13 LAB — URINALYSIS, ROUTINE W REFLEX MICROSCOPIC
BILIRUBIN URINE: NEGATIVE
Glucose, UA: NEGATIVE mg/dL
KETONES UR: NEGATIVE mg/dL
NITRITE: NEGATIVE
PH: 6.5 (ref 5.0–8.0)
PROTEIN: 30 mg/dL — AB
Specific Gravity, Urine: 1.012 (ref 1.005–1.030)
Urobilinogen, UA: 0.2 mg/dL (ref 0.0–1.0)

## 2014-05-13 LAB — SURGICAL PCR SCREEN
MRSA, PCR: NEGATIVE
Staphylococcus aureus: NEGATIVE

## 2014-05-13 LAB — CBC WITH DIFFERENTIAL/PLATELET
BASOS ABS: 0 10*3/uL (ref 0.0–0.1)
Basophils Relative: 1 % (ref 0–1)
EOS PCT: 3 % (ref 0–5)
Eosinophils Absolute: 0.2 10*3/uL (ref 0.0–0.7)
HCT: 34.8 % — ABNORMAL LOW (ref 36.0–46.0)
Hemoglobin: 12.3 g/dL (ref 12.0–15.0)
LYMPHS ABS: 2.4 10*3/uL (ref 0.7–4.0)
LYMPHS PCT: 39 % (ref 12–46)
MCH: 35 pg — ABNORMAL HIGH (ref 26.0–34.0)
MCHC: 35.3 g/dL (ref 30.0–36.0)
MCV: 99.1 fL (ref 78.0–100.0)
Monocytes Absolute: 0.8 10*3/uL (ref 0.1–1.0)
Monocytes Relative: 13 % — ABNORMAL HIGH (ref 3–12)
NEUTROS ABS: 2.8 10*3/uL (ref 1.7–7.7)
NEUTROS PCT: 46 % (ref 43–77)
PLATELETS: 212 10*3/uL (ref 150–400)
RBC: 3.51 MIL/uL — AB (ref 3.87–5.11)
RDW: 13.4 % (ref 11.5–15.5)
WBC: 6.1 10*3/uL (ref 4.0–10.5)

## 2014-05-13 LAB — BASIC METABOLIC PANEL
ANION GAP: 14 (ref 5–15)
BUN: 25 mg/dL — ABNORMAL HIGH (ref 6–23)
CALCIUM: 9.5 mg/dL (ref 8.4–10.5)
CO2: 23 meq/L (ref 19–32)
Chloride: 97 mEq/L (ref 96–112)
Creatinine, Ser: 0.8 mg/dL (ref 0.50–1.10)
GFR calc Af Amer: 71 mL/min — ABNORMAL LOW (ref >90)
GFR calc non Af Amer: 61 mL/min — ABNORMAL LOW (ref >90)
Glucose, Bld: 105 mg/dL — ABNORMAL HIGH (ref 70–99)
POTASSIUM: 4.1 meq/L (ref 3.7–5.3)
SODIUM: 134 meq/L — AB (ref 137–147)

## 2014-05-13 LAB — COMPREHENSIVE METABOLIC PANEL
ALBUMIN: 3.8 g/dL (ref 3.5–5.2)
ALT: 9 U/L (ref 0–35)
ANION GAP: 14 (ref 5–15)
AST: 23 U/L (ref 0–37)
Alkaline Phosphatase: 44 U/L (ref 39–117)
BILIRUBIN TOTAL: 0.5 mg/dL (ref 0.3–1.2)
BUN: 25 mg/dL — AB (ref 6–23)
CHLORIDE: 101 meq/L (ref 96–112)
CO2: 20 mEq/L (ref 19–32)
CREATININE: 0.76 mg/dL (ref 0.50–1.10)
Calcium: 9 mg/dL (ref 8.4–10.5)
GFR calc Af Amer: 81 mL/min — ABNORMAL LOW (ref >90)
GFR calc non Af Amer: 70 mL/min — ABNORMAL LOW (ref >90)
GLUCOSE: 95 mg/dL (ref 70–99)
POTASSIUM: 4.4 meq/L (ref 3.7–5.3)
Sodium: 135 mEq/L — ABNORMAL LOW (ref 137–147)
TOTAL PROTEIN: 6.5 g/dL (ref 6.0–8.3)

## 2014-05-13 LAB — I-STAT TROPONIN, ED: TROPONIN I, POC: 0.01 ng/mL (ref 0.00–0.08)

## 2014-05-13 LAB — I-STAT CG4 LACTIC ACID, ED: Lactic Acid, Venous: 1.14 mmol/L (ref 0.5–2.2)

## 2014-05-13 LAB — TROPONIN I: Troponin I: <0.3 ng/mL (ref <0.30)

## 2014-05-13 MED ORDER — IRBESARTAN 75 MG PO TABS
75.0000 mg | ORAL_TABLET | Freq: Every day | ORAL | Status: DC
Start: 2014-05-13 — End: 2014-05-15
  Administered 2014-05-13 – 2014-05-15 (×3): 75 mg via ORAL
  Filled 2014-05-13 (×3): qty 1

## 2014-05-13 MED ORDER — SODIUM CHLORIDE 0.9 % IV SOLN
INTRAVENOUS | Status: DC
  Administered 2014-05-13: 20:00:00 via INTRAVENOUS

## 2014-05-13 MED ORDER — VITAMIN C 500 MG PO TABS
1000.0000 mg | ORAL_TABLET | Freq: Every day | ORAL | Status: DC
  Administered 2014-05-13 – 2014-05-15 (×3): 1000 mg via ORAL
  Filled 2014-05-13 (×3): qty 2

## 2014-05-13 MED ORDER — ONDANSETRON HCL 4 MG/2ML IJ SOLN: 4.0000 mg | Freq: Four times a day (QID) | INTRAMUSCULAR | Status: DC | PRN

## 2014-05-13 MED ORDER — AMLODIPINE BESYLATE 10 MG PO TABS
10.0000 mg | ORAL_TABLET | Freq: Every day | ORAL | Status: DC
  Administered 2014-05-13 – 2014-05-15 (×3): 10 mg via ORAL
  Filled 2014-05-13 (×3): qty 1

## 2014-05-13 MED ORDER — CLOPIDOGREL BISULFATE 75 MG PO TABS
75.0000 mg | ORAL_TABLET | Freq: Every day | ORAL | Status: DC
  Administered 2014-05-13 – 2014-05-15 (×3): 75 mg via ORAL
  Filled 2014-05-13 (×2): qty 1

## 2014-05-13 MED ORDER — HYDROCHLOROTHIAZIDE 12.5 MG PO CAPS
12.5000 mg | ORAL_CAPSULE | Freq: Two times a day (BID) | ORAL | Status: DC
  Administered 2014-05-13 – 2014-05-15 (×4): 12.5 mg via ORAL
  Filled 2014-05-13 (×5): qty 1

## 2014-05-13 MED ORDER — HYDROCHLOROTHIAZIDE 25 MG PO TABS
12.5000 mg | ORAL_TABLET | Freq: Two times a day (BID) | ORAL | Status: DC
  Filled 2014-05-13: qty 0.5

## 2014-05-13 MED ORDER — LETROZOLE 2.5 MG PO TABS
2.5000 mg | ORAL_TABLET | Freq: Every day | ORAL | Status: DC
  Administered 2014-05-14 – 2014-05-15 (×2): 2.5 mg via ORAL
  Filled 2014-05-13 (×3): qty 1

## 2014-05-13 MED ORDER — ASPIRIN EC 81 MG PO TBEC
81.0000 mg | DELAYED_RELEASE_TABLET | Freq: Every day | ORAL | Status: DC
  Administered 2014-05-14 – 2014-05-15 (×2): 81 mg via ORAL
  Filled 2014-05-13 (×3): qty 1

## 2014-05-13 MED ORDER — TECHNETIUM TC 99M SESTAMIBI GENERIC - CARDIOLITE
11.0000 | Freq: Once | INTRAVENOUS | Status: AC | PRN
  Administered 2014-05-13: 11 via INTRAVENOUS

## 2014-05-13 NOTE — ED Notes (Signed)
Pt came to hospital from PCP due to abnormal EKG. Pt is also bradycardic at 38. Pt is alert and oriented. 24 G PIV to RFA placed by PCP. Flushed. New PIV 22 G placed to LFA.

## 2014-05-13 NOTE — Progress Notes (Signed)
Echo performed. 

## 2014-05-13 NOTE — ED Notes (Signed)
Pt reports being sob x 2 months. Went to pcp today and unable to complete stress test due to sob. Sent here for admission to get pacemaker, called bed control and pt does not have bed assignment. HR 32 at triage.

## 2014-05-13 NOTE — H&P (Signed)
Patient ID: Stephanie Cochran MRN: [id number] DOB/AGE: Nov 23, 1917 80 y.o. Admit date: 05/13/2014  Primary Cardiologist: Jill Alexanders  HPI: 80 yo female with history of CAD, HTN, renal artery stenosis s/p stenting, HLD, atrial fib/flutter, s/p AVR, breast cancer being admitted with symptomatic bradycardia. She underwent CABG and AVR with bioprosthetic aortic valve in 2007. She traveled to [place] 2 months ago and began to feel dyspneic while there. She was seen in the office 04/22/14 by Richardson Dopp, PA-C with the complaints of dyspnea. D-dimer was elevated leading to a Chest CTA on 04/23/14 which was negative for PE. Echo and stress myoview planned for today but when she showed up in our office, she was found to be in Second degree AV block with HR of 32 bpm. She was sent from our office to the ED for admission.   She tells me that she has been easily dyspneic for several months. She denies any chest pain. She has had at least two falls due to syncope over the last month. Her daughter confirms this and states that she is very unsteady on her feet. No LE edema or SOB at rest.   Review of systems complete and found to be negative unless listed above   Past Medical History  Diagnosis Date  . CAD (coronary artery disease)     s/p CABG 2007 at [facility]  . HTN (hypertension)   . PVD (peripheral vascular disease)   . Diverticulosis     history  . HLD (hyperlipidemia)   . Atrial fibrillation     post operative  . Atrial flutter     post operative  . Cellulitis     left forearm  . Aortic stenosis     a. s/p bioprosthetic AVR 2007 [facility]);  b. Echo (04/2009 - [facility] in [place], [place]):  Mild LVH, normal LVF, anterior WMA, AVR ok (mean 4 mmHg), MAC, mild MR, mild TV stenosis, RVSP 44 mmHg  . Breast cancer     left mastectomy  . Post-menopausal   . Arthritis     Family History  Problem Relation Age of Onset  . Colon cancer Sister   . Diabetes    . Heart disease    .  Arthritis      History   Social History  . Marital Status: Widowed    Spouse Name: N/A    Number of Children: N/A  . Years of Education: N/A   Occupational History  . Not on file.   Social History Main Topics  . Smoking status: Former Smoker    Quit date: 09/27/1960  . Smokeless tobacco: Never Used  . Alcohol Use: No  . Drug Use: No  . Sexual Activity: Not on file   Other Topics Concern  . Not on file   Social History Narrative  . No narrative on file    Past Surgical History  Procedure Laterality Date  . Renal artery stent  2006, 2010  . Aortic valve replacement  2007    with 46mm [facility] pericardial tissue valve   . Coronary artery bypass graft  2007    x3, left internal mammary artery to the left anterior descending, saphenous vein graft tot he circumflex coronary  . Arteriovenous graft placement w/ endoscopic vein harvest      left leg  . Mastectomy  2008    left, breast cancer, followed by a hematology oncologist  . Tonsillectomy  1943  . Hemorroidectomy    .  Breast surgery    . Eye surgery    . Cardiac valve replacement      Allergies  Allergen Reactions  . Codeine Nausea And Vomiting  . Penicillins Nausea And Vomiting  . Vicodin [Hydrocodone-Acetaminophen] Nausea And Vomiting    Prior to Admission Meds:  amLODipine (NORVASC) 10 MG tablet, Take 10 mg by mouth Daily. , Disp: , Rfl: ;   aspirin EC 81 MG tablet, Take 81 mg by mouth daily., Disp: , Rfl: ;   carvedilol (COREG) 6.25 MG tablet, Take 6.25 mg by mouth daily., Disp: , Rfl: ;   clopidogrel (PLAVIX) 75 MG tablet, Take 75 mg by mouth daily., Disp: , Rfl: ;   Cyanocobalamin (B-12) 1000 MCG CAPS, Take 1,000 mcg by mouth daily. , Disp: , Rfl:  hydrochlorothiazide (HYDRODIURIL) 25 MG tablet, Take 12.5 mg by mouth 2 (two) times daily. , Disp: , Rfl: ;  l etrozole (FEMARA) 2.5 MG tablet, Take 2.5 mg by mouth daily., Disp: , Rfl: ;   olmesartan (BENICAR) 20 MG tablet, Take 1 tablet (20 mg total) by  mouth daily., Disp: 30 tablet, Rfl: 11;  Vitamin D, Ergocalciferol, (DRISDOL) 50000 UNITS CAPS, Take 50,000 Units by mouth every 30 (thirty) days. On or about the 1st of each month, Disp: , Rfl:  vitamin E 400 UNIT capsule, Take 400 Units by mouth daily., Disp: , Rfl:   Physical Exam: Blood pressure 167/89, pulse 32, temperature 97.3 F (36.3 C), temperature source Oral, resp. rate 12, SpO2 99.00%.  General: Well developed, well nourished, NAD  HEENT: OP clear, mucus membranes moist  SKIN: warm, dry. No rashes.  Neuro: No focal deficits  Musculoskeletal: Muscle strength 5/5 all ext  Psychiatric: Mood and affect normal  Neck: No JVD, no carotid bruits, no thyromegaly, no lymphadenopathy.  Lungs:Clear bilaterally, no wheezes, rhonci, crackles  Cardiovascular: Loletha Grayer. No murmurs, gallops or rubs.  Abdomen:Soft. Bowel sounds present. Non-tender.  Extremities: No lower extremity edema. Pulses are 2 + in the bilateral DP/PT.  Labs: Pending  Chest CTA 04/23/14: 1. There is no acute pulmonary embolism nor acute thoracic aortic  pathology. There is significant mural calcification within the  aorta.  2. There is no evidence of CHF. There are significant coronary  artery calcifications. The patient has undergone previous CABG.  3. There are emphysematous changes bilaterally without evidence of  pneumonia.  EKG: sinus with second degree AV block  ASSESSMENT AND PLAN:   1. Second degree AV block: She has high grade AV block. She is symptomatic. She will need beta blockers long term for management of CAD so pacemaker is indicated. Holding Coreg today and tonight. Will admit to stepdown and follow on telemetry. I discussed with EP and will likely plan permanent pacemaker tomorrow. NPO at midnight.   2. CAD: s/p CABG. Stable. No angina. I think her dyspnea on exertion is likely due to her bradycardia/AV block. Will not plan cardiac cath at this time.     Darlina Guys, MD 05/13/2014, 5:14  PM

## 2014-05-13 NOTE — Progress Notes (Unsigned)
[facility] [address] [place], [place] [address] [phone]    Cardiology Nuclear Med Study  Stephanie Cochran is a 80 y.o. female     MRN : [id number]     DOB: [birth year]  Procedure Date: 05/13/2014  Nuclear Med Background Indication for Stress Test:  Evaluation for Ischemia and Graft Patency History:  CAD CATH CABG-AVR AFIB/AFLUTTER(PostOp) '10 ECHO EF: 78%, Cardiac Risk Factors: Family History - CAD, History of Smoking, Hypertension, Lipids and PVD  Symptoms:  DOE   Nuclear Pre-Procedure Caffeine/Decaff Intake:  None NPO After: 7:00pm   Lungs:  clear O2 Sat: 98% on room air. IV 0.9% NS with Angio Cath:  24g  IV Site: R Forearm  IV Started by:  Crissie Figures, RN  Chest Size (in):  34 Cup Size: A  Height: 5\' 1"  (1.549 m)  Weight:  118 lb (53.524 kg)  BMI:  Body mass index is 22.31 kg/(m^2). Tech Comments:  No Coreg x 24 hrs.    Nuclear Med Study 1 or 2 day study: 1 day  Stress Test Type:  Lexiscan  Reading MD: n/a  Order Authorizing Provider:  Filiberto Pinks  Resting Radionuclide: Technetium 26m Sestamibi  Resting Radionuclide Dose: *** mCi   Stress Radionuclide:  Technetium 43m Sestamibi  Stress Radionuclide Dose: *** mCi           Stress Protocol Rest HR: *** Stress HR: ***  Rest BP: *** Stress BP: ***  Exercise Time (min): {NA AND WILDCARD:21589} METS: {NA AND BSWHQPRF:16384}           Dose of Adenosine (mg):  {NA AND YKZLDJTT:01779} Dose of Lexiscan: {CHL CARD WILDCARD AND 0.4:21590} mg  Dose of Atropine (mg): {NA AND TJQZESPQ:33007} Dose of Dobutamine: {NA AND WILDCARD:21589} mcg/kg/min (at max HR)  Stress Test Technologist: {CHL LB STRESS TEST TECHNOLOGIST:21021024}  Nuclear Technologist:  {CHL LB NUCLEAR TECHNOLOGIST:21021025}     Rest Procedure:  Myocardial perfusion imaging was performed at rest 45 minutes following the intravenous administration of Technetium 58m Sestamibi. Rest ECG: {CHL REST MAU:63335}  Stress  Procedure:  {CHL STRESS PROCEDURE NUCLEAR:21021028} Stress ECG: {CHL CAR STRESS ECG:21561}  QPS Raw Data Images:  {CHL RAW DATA IMAGES NUC:21021029} Stress Images:  {CHL STRESS IMAGES NUC:21021030} Rest Images:  {CHL REST IMAGES NUC:21021031} Subtraction (SDS):  {CHL SUBTRACTION (SDS) NUC:21021032} Transient Ischemic Dilatation (Normal <1.22):  *** Lung/Heart Ratio (Normal <0.45):  ***  Quantitative Gated Spect Images QGS EDV:  *** ml QGS ESV:  *** ml  Impression Exercise Capacity:  {CHL EXERCISE CAPACITY NUC:21021037} BP Response:  {CHL BP RESPONSE NUC:21021038} Clinical Symptoms:  {CHL CLINICAL SYMPTOMS NUC:21021039} ECG Impression:  {CHL ECG IMPRESSION NUC:21021040} Comparison with Prior Nuclear Study: {CHL NUCLEAR STUDY COMPARISON:21562}  Overall Impression:  {CHL OVERALL IMPRESSION NUC:21021041}  LV Ejection Fraction: {CHL CARD STUDY NOT GATED:21592:o}.  LV Wall Motion:  {CHL CARD QGS:21591:o}

## 2014-05-13 NOTE — ED Provider Notes (Signed)
CSN: [id number]     Arrival date & time 05/13/14  1557 History   First MD Initiated Contact with Patient 05/13/14 1651     Chief Complaint  Patient presents with  . Shortness of Breath     (Consider location/radiation/quality/duration/timing/severity/associated sxs/prior Treatment) Patient is a 80 y.o. female presenting with shortness of breath.  Shortness of Breath Severity:  Severe Onset quality:  Gradual Duration:  2 months Timing:  Constant Progression:  Unchanged Chronicity:  New Context: activity   Relieved by:  Rest Exacerbated by: walking, positional changes, housework. Associated symptoms: no chest pain and no cough     Past Medical History  Diagnosis Date  . CAD (coronary artery disease)     s/p CABG 2007 at [facility]  . HTN (hypertension)   . PVD (peripheral vascular disease)   . Diverticulosis     history  . HLD (hyperlipidemia)   . Atrial fibrillation     post operative  . Atrial flutter     post operative  . Cellulitis     left forearm  . Aortic stenosis     a. s/p bioprosthetic AVR 2007 [facility]);  b. Echo (04/2009 - [facility] in [place], [place]):  Mild LVH, normal LVF, anterior WMA, AVR ok (mean 4 mmHg), MAC, mild MR, mild TV stenosis, RVSP 44 mmHg  . Breast cancer     left mastectomy  . Post-menopausal   . Arthritis    Past Surgical History  Procedure Laterality Date  . Renal artery stent  2006, 2010  . Aortic valve replacement  2007    with 78mm [facility] pericardial tissue valve   . Coronary artery bypass graft  2007    x3, left internal mammary artery to the left anterior descending, saphenous vein graft tot he circumflex coronary  . Arteriovenous graft placement w/ endoscopic vein harvest      left leg  . Mastectomy  2008    left, breast cancer, followed by a hematology oncologist  . Tonsillectomy  1943  . Hemorroidectomy    . Breast surgery    . Eye surgery    . Cardiac valve replacement     Family History  Problem Relation Age of  Onset  . Colon cancer Sister   . Diabetes    . Heart disease    . Arthritis     History  Substance Use Topics  . Smoking status: Former Smoker    Quit date: 09/27/1960  . Smokeless tobacco: Never Used  . Alcohol Use: No   OB History   Grav Para Term Preterm Abortions TAB SAB Ect Mult Living                 Review of Systems  Respiratory: Positive for shortness of breath. Negative for cough.   Cardiovascular: Negative for chest pain.  All other systems reviewed and are negative.     Allergies  Codeine; Penicillins; and Vicodin  Home Medications   Prior to Admission medications   Medication Sig Start Date End Date Taking? Authorizing Provider  amLODipine (NORVASC) 10 MG tablet Take 10 mg by mouth Daily.  11/03/11  Yes Historical Provider, MD  aspirin EC 81 MG tablet Take 81 mg by mouth daily.   Yes Historical Provider, MD  carvedilol (COREG) 6.25 MG tablet Take 6.25 mg by mouth daily.   Yes Historical Provider, MD  clopidogrel (PLAVIX) 75 MG tablet Take 75 mg by mouth daily.   Yes Historical Provider, MD  Cyanocobalamin (B-12) 1000  MCG CAPS Take 1,000 mcg by mouth daily.    Yes Historical Provider, MD  hydrochlorothiazide (HYDRODIURIL) 25 MG tablet Take 12.5 mg by mouth 2 (two) times daily.    Yes Historical Provider, MD  letrozole (FEMARA) 2.5 MG tablet Take 2.5 mg by mouth daily.   Yes Historical Provider, MD  olmesartan (BENICAR) 20 MG tablet Take 1 tablet (20 mg total) by mouth daily. 04/22/14  Yes Scott T Kathlen Mody, PA-C  Vitamin D, Ergocalciferol, (DRISDOL) 50000 UNITS CAPS Take 50,000 Units by mouth every 30 (thirty) days. On or about the 1st of each month   Yes Historical Provider, MD  vitamin E 400 UNIT capsule Take 400 Units by mouth daily.   Yes Historical Provider, MD   BP 167/89  Pulse 32  Temp(Src) 97.3 F (36.3 C) (Oral)  Resp 12  SpO2 99% Physical Exam  Nursing note and vitals reviewed. Constitutional: She is oriented to person, place, and time. She appears  well-developed and well-nourished. No distress.  elderly  HENT:  Head: Normocephalic and atraumatic.  Mouth/Throat: Oropharynx is clear and moist.  Eyes: Conjunctivae are normal. Pupils are equal, round, and reactive to light. No scleral icterus.  Neck: Neck supple.  Cardiovascular: Regular rhythm, normal heart sounds and intact distal pulses.  Bradycardia present.   No murmur heard. Pulmonary/Chest: Effort normal and breath sounds normal. No stridor. No respiratory distress. She has no rales.  Abdominal: Soft. Bowel sounds are normal. She exhibits no distension. There is no tenderness.  Musculoskeletal: Normal range of motion.  Neurological: She is alert and oriented to person, place, and time.  Skin: Skin is warm and dry. No rash noted.  Psychiatric: She has a normal mood and affect. Her behavior is normal.    ED Course  Procedures (including critical care time) Labs Review Labs Reviewed  CBC WITH DIFFERENTIAL - Abnormal; Notable for the following:    RBC 3.51 (*)    HCT 34.8 (*)    MCH 35.0 (*)    Monocytes Relative 13 (*)    All other components within normal limits  BASIC METABOLIC PANEL - Abnormal; Notable for the following:    Sodium 134 (*)    Glucose, Bld 105 (*)    BUN 25 (*)    GFR calc non Af Amer 61 (*)    GFR calc Af Amer 71 (*)    All other components within normal limits  PRO B NATRIURETIC PEPTIDE - Abnormal; Notable for the following:    Pro B Natriuretic peptide (BNP) 1155.0 (*)    All other components within normal limits  TROPONIN I  URINALYSIS, ROUTINE W REFLEX MICROSCOPIC  I-STAT TROPOININ, ED  I-STAT CG4 LACTIC ACID, ED    Imaging Review Dg Chest Port 1 View  05/13/2014   CLINICAL DATA:  Short of breath.  EXAM: PORTABLE CHEST - 1 VIEW  COMPARISON:  04/23/2014.  FINDINGS: Cardiopericardial silhouette within normal limits. CABG/median sternotomy. Monitoring leads project over the chest. There is pulmonary vascular congestion and diffuse prominence of  the interstitium consistent with interstitial pulmonary edema. Lung volumes are lower than on prior study. Chronic LEFT rotator cuff tear with high-riding humeral head and glenohumeral osteoarthritis. There is no focal consolidation to suggest pneumonia. Surgical clip present over the LEFT breast.  IMPRESSION: Low volume chest with interstitial opacity most compatible with interstitial pulmonary edema.   Electronically Signed   By: Dereck Ligas M.D.   On: 05/13/2014 17:46  All radiology studies independently viewed by me.  EKG Interpretation None     EKG - sinsu rhythm with 2nd degree AV block with 2:1 AV conduction, rate 32, normal axis, normal QTc, no ST/T changes, compared to prior, now with AV block.    MDM   Final diagnoses:  Heart block AV second degree  Coronary artery disease involving native coronary artery of native heart without angina pectoris    80 yo female has been feeling SOB with exertion for about two months.  She notes that it seems to start when she was vacationing in [place] and had to stop to rest every few feet.   Went for stress test today, but could not complete.  Found to be bradycardic with 2nd degree AV block.  Plan labs, close cardiac monitoring, cardiology consult.    Cards to admit.   Houston Siren III, MD 05/14/14 [phone]

## 2014-05-14 ENCOUNTER — Encounter (HOSPITAL_COMMUNITY)
Admission: EM | Disposition: A | Discharge status: Home / Self Care | Payer: Self-pay | Source: Home / Self Care | Attending: Cardiovascular Disease

## 2014-05-14 ENCOUNTER — Encounter (HOSPITAL_COMMUNITY): Payer: Self-pay | Admitting: General Practice

## 2014-05-14 DIAGNOSIS — I10 Essential (primary) hypertension: Secondary | ICD-10-CM

## 2014-05-14 DIAGNOSIS — Z952 Presence of prosthetic heart valve: Secondary | ICD-10-CM

## 2014-05-14 DIAGNOSIS — I441 Atrioventricular block, second degree: Secondary | ICD-10-CM

## 2014-05-14 HISTORY — PX: PERMANENT PACEMAKER INSERTION: SHX5480

## 2014-05-14 LAB — CBC
HCT: 27.9 % — ABNORMAL LOW (ref 36.0–46.0)
Hemoglobin: 9.7 g/dL — ABNORMAL LOW (ref 12.0–15.0)
MCH: 35 pg — ABNORMAL HIGH (ref 26.0–34.0)
MCHC: 34.8 g/dL (ref 30.0–36.0)
MCV: 100.7 fL — ABNORMAL HIGH (ref 78.0–100.0)
Platelets: 186 10*3/uL (ref 150–400)
RBC: 2.77 MIL/uL — ABNORMAL LOW (ref 3.87–5.11)
RDW: 13.4 % (ref 11.5–15.5)
WBC: 4.6 10*3/uL (ref 4.0–10.5)

## 2014-05-14 LAB — BASIC METABOLIC PANEL
ANION GAP: 11 (ref 5–15)
BUN: 24 mg/dL — ABNORMAL HIGH (ref 6–23)
CO2: 24 mEq/L (ref 19–32)
Calcium: 8.6 mg/dL (ref 8.4–10.5)
Chloride: 101 mEq/L (ref 96–112)
Creatinine, Ser: 0.81 mg/dL (ref 0.50–1.10)
GFR calc Af Amer: 70 mL/min — ABNORMAL LOW (ref >90)
GFR calc non Af Amer: 60 mL/min — ABNORMAL LOW (ref >90)
Glucose, Bld: 81 mg/dL (ref 70–99)
Potassium: 4 mEq/L (ref 3.7–5.3)
SODIUM: 136 meq/L — AB (ref 137–147)

## 2014-05-14 LAB — PROTIME-INR
INR: 1.2 (ref 0.00–1.49)
PROTHROMBIN TIME: 15.2 s (ref 11.6–15.2)

## 2014-05-14 SURGERY — PERMANENT PACEMAKER INSERTION
Anesthesia: LOCAL

## 2014-05-14 MED ORDER — VANCOMYCIN HCL IN DEXTROSE 1-5 GM/200ML-% IV SOLN: 1000.0000 mg | Freq: Two times a day (BID) | INTRAVENOUS | Status: DC

## 2014-05-14 MED ORDER — ACETAMINOPHEN 325 MG PO TABS
325.0000 mg | ORAL_TABLET | ORAL | Status: DC | PRN
  Administered 2014-05-14 (×2): 650 mg via ORAL
  Filled 2014-05-14 (×2): qty 2

## 2014-05-14 MED ORDER — CHLORHEXIDINE GLUCONATE 4 % EX LIQD
60.0000 mL | Freq: Once | CUTANEOUS | Status: AC
  Administered 2014-05-14: 4 via TOPICAL

## 2014-05-14 MED ORDER — LIDOCAINE HCL (PF) 1 % IJ SOLN
INTRAMUSCULAR | Status: AC
Start: 2014-05-14 — End: 2014-05-14
  Filled 2014-05-14: qty 60

## 2014-05-14 MED ORDER — HEPARIN (PORCINE) IN NACL 2-0.9 UNIT/ML-% IJ SOLN
INTRAMUSCULAR | Status: AC
  Filled 2014-05-14: qty 500

## 2014-05-14 MED ORDER — SODIUM CHLORIDE 0.9 % IJ SOLN: 3.0000 mL | INTRAMUSCULAR | Status: DC | PRN

## 2014-05-14 MED ORDER — CHLORHEXIDINE GLUCONATE 4 % EX LIQD
CUTANEOUS | Status: AC
  Filled 2014-05-14: qty 15

## 2014-05-14 MED ORDER — VANCOMYCIN HCL IN DEXTROSE 1-5 GM/200ML-% IV SOLN
1000.0000 mg | INTRAVENOUS | Status: DC
  Filled 2014-05-14: qty 200

## 2014-05-14 MED ORDER — SODIUM CHLORIDE 0.9 % IR SOLN
80.0000 mg | Status: DC
  Filled 2014-05-14 (×2): qty 2

## 2014-05-14 MED ORDER — SODIUM CHLORIDE 0.9 % IV SOLN: 250.0000 mL | INTRAVENOUS | Status: DC | PRN

## 2014-05-14 MED ORDER — SODIUM CHLORIDE 0.9 % IJ SOLN: 3.0000 mL | Freq: Two times a day (BID) | INTRAMUSCULAR | Status: DC

## 2014-05-14 MED ORDER — CHLORHEXIDINE GLUCONATE 4 % EX LIQD: 60.0000 mL | Freq: Once | CUTANEOUS | Status: DC

## 2014-05-14 MED ORDER — CARVEDILOL 6.25 MG PO TABS
6.2500 mg | ORAL_TABLET | Freq: Every day | ORAL | Status: DC
  Administered 2014-05-14 – 2014-05-15 (×2): 6.25 mg via ORAL
  Filled 2014-05-14 (×2): qty 1
  Filled 2014-05-14: qty 2

## 2014-05-14 MED ORDER — SODIUM CHLORIDE 0.9 % IV SOLN
500.0000 mg | Freq: Two times a day (BID) | INTRAVENOUS | Status: AC
  Administered 2014-05-15: 500 mg via INTRAVENOUS
  Filled 2014-05-14: qty 500

## 2014-05-14 MED ORDER — SODIUM CHLORIDE 0.9 % IV SOLN
INTRAVENOUS | Status: DC
  Administered 2014-05-14: 11:00:00 via INTRAVENOUS

## 2014-05-14 NOTE — Care Management Note (Signed)
    Page 1 of 1   05/14/2014     9:13:39 AM CARE MANAGEMENT NOTE 05/14/2014  Patient:  Stephanie Cochran, Stephanie Cochran   Account Number:  [id number]  Date Initiated:  05/14/2014  Documentation initiated by:  Elissa Hefty  Subjective/Objective Assessment:   adm w sec deg heart block     Action/Plan:   lives at home, pcp dr w Leonides Schanz   Anticipated DC Date:     Anticipated DC Plan:           Choice offered to / List presented to:             Status of service:   Medicare Important Message given?   (If response is "NO", the following Medicare IM given date fields will be blank) Date Medicare IM given:   Medicare IM given by:   Date Additional Medicare IM given:   Additional Medicare IM given by:    Discharge Disposition:    Per UR Regulation:  Reviewed for med. necessity/level of care/duration of stay  If discussed at [place] of Stay Meetings, dates discussed:    Comments:

## 2014-05-14 NOTE — Interval H&P Note (Signed)
History and Physical Interval Note:  05/14/2014 11:44 AM  Stephanie Cochran  has presented today for surgery, with the diagnosis of bradicardia  The various methods of treatment have been discussed with the patient and family. After consideration of risks, benefits and other options for treatment, the patient has consented to  Procedure(s): PERMANENT PACEMAKER INSERTION (N/A) as a surgical intervention .  The patient's history has been reviewed, patient examined, no change in status, stable for surgery.  I have reviewed the patient's chart and labs.  Questions were answered to the patient's satisfaction.     [name]

## 2014-05-14 NOTE — Consult Note (Signed)
ELECTROPHYSIOLOGY CONSULT NOTE    Patient ID: Stephanie Cochran MRN: [id number], DOB/AGE: [birth year] 80 y.o.  Admit date: 05/13/2014 Date of Consult: 05-14-14  Primary Physician: Cari Caraway, MD Primary Cardiologist: Meda Coffee  Reason for Consultation: symptomatic bradycardia  HPI:  Stephanie Cochran is a 80 y.o. female with a past medical history significant for CAD (s/p CABG), renal artery stenosis (s/p stenting), hyperlipidemia, atrial fibrillation (post CABG), prior AVR, and breast cancer (s/p left mastectomy).  She has had progressive dyspnea on exertion over the past month.  Plans were for outpatient echo and myoview.  When the patient presented for [facility], she was found to be in 2:1 heart block and was referred to [facility] for further evaluation.   She is on Carvedilol 6.25 twice daily at home with last dose 05-12-14 afternoon.  AV block persists intermittently despite washout of this medicine.    She has had at least 2 falls in the last month.  She denies frank syncope, but does report dizziness prior to falling.  She is still very active and travelled to [place] recently with her family.  She denies chest pain, lower extremity edema, recent fevers, chills, nausea or vomiting.  ROS is otherwise negative.   Echo 05-13-14 demonstrated EF 60-65%, no RWMA, mild mitral stenosis, moderate MR, LA 45.   EP has been asked to evaluate for treatment options.   Past Medical History  Diagnosis Date  . CAD (coronary artery disease)     s/p CABG 2007 at [facility]  . HTN (hypertension)   . PVD (peripheral vascular disease)   . Diverticulosis     history  . HLD (hyperlipidemia)   . Atrial fibrillation     post operative  . Atrial flutter     post operative  . Cellulitis     left forearm  . Aortic stenosis     a. s/p bioprosthetic AVR 2007 [facility]);  b. Echo (04/2009 - [facility] in [place], [place]):  Mild LVH, normal LVF, anterior WMA, AVR ok (mean 4 mmHg), MAC, mild MR, mild TV stenosis, RVSP 44 mmHg    . Breast cancer     left mastectomy  . Post-menopausal   . Arthritis      Surgical History:  Past Surgical History  Procedure Laterality Date  . Renal artery stent  2006, 2010  . Aortic valve replacement  2007    with 39mm [facility] pericardial tissue valve   . Coronary artery bypass graft  2007    x3, left internal mammary artery to the left anterior descending, saphenous vein graft tot he circumflex coronary  . Arteriovenous graft placement w/ endoscopic vein harvest      left leg  . Mastectomy  2008    left, breast cancer, followed by a hematology oncologist  . Tonsillectomy  1943  . Hemorroidectomy    . Breast surgery    . Eye surgery    . Cardiac valve replacement       Prescriptions prior to admission  Medication Sig Dispense Refill  . amLODipine (NORVASC) 10 MG tablet Take 10 mg by mouth Daily.       Marland Kitchen aspirin EC 81 MG tablet Take 81 mg by mouth daily.      . carvedilol (COREG) 6.25 MG tablet Take 6.25 mg by mouth daily.      . clopidogrel (PLAVIX) 75 MG tablet Take 75 mg by mouth daily.      . Cyanocobalamin (B-12) 1000 MCG CAPS Take 1,000 mcg by mouth  daily.       . diphenhydramine-acetaminophen (TYLENOL PM) 25-500 MG TABS Take 0.5 tablets by mouth at bedtime.      . hydrochlorothiazide (HYDRODIURIL) 25 MG tablet Take 12.5 mg by mouth 2 (two) times daily.       Marland Kitchen ibuprofen (ADVIL,MOTRIN) 200 MG tablet Take 400 mg by mouth daily as needed (back pain).      Marland Kitchen letrozole (FEMARA) 2.5 MG tablet Take 2.5 mg by mouth daily.      Marland Kitchen olmesartan (BENICAR) 20 MG tablet Take 1 tablet (20 mg total) by mouth daily.  30 tablet  11  . Polyethyl Glycol-Propyl Glycol (SYSTANE OP) Place 1 drop into both eyes 2 (two) times daily as needed (dry eyes).      . vitamin C (ASCORBIC ACID) 500 MG tablet Take 1,000 mg by mouth daily.      . Vitamin D, Ergocalciferol, (DRISDOL) 50000 UNITS CAPS Take 50,000 Units by mouth every 30 (thirty) days. On or about the 1st of each month      .  vitamin E 400 UNIT capsule Take 400 Units by mouth daily.        Inpatient Medications:  . amLODipine  10 mg Oral Daily  . aspirin EC  81 mg Oral Daily  . clopidogrel  75 mg Oral Daily  . hydrochlorothiazide  12.5 mg Oral BID  . irbesartan  75 mg Oral Daily  . letrozole  2.5 mg Oral Daily  . vitamin C  1,000 mg Oral Daily    Allergies:  Allergies  Allergen Reactions  . Codeine Nausea And Vomiting  . Penicillins Nausea And Vomiting  . Vicodin [Hydrocodone-Acetaminophen] Nausea And Vomiting    History   Social History  . Marital Status: Widowed    Spouse Name: N/A    Number of Children: N/A  . Years of Education: N/A   Occupational History  . Not on file.   Social History Main Topics  . Smoking status: Former Smoker    Quit date: 09/27/1960  . Smokeless tobacco: Never Used  . Alcohol Use: No  . Drug Use: No  . Sexual Activity: Not on file   Other Topics Concern  . Not on file   Social History Narrative  . No narrative on file     Family History  Problem Relation Age of Onset  . Colon cancer Sister   . Diabetes    . Heart disease    . Arthritis      BP 118/28  Pulse 47  Temp(Src) 97.5 F (36.4 C) (Oral)  Resp 14  Ht 5\' 2"  (1.575 m)  Wt 121 lb 4.1 oz (55 kg)  BMI 22.17 kg/m2  SpO2 92%  Physical Exam: Physical Exam: Filed Vitals:   05/14/14 0400 05/14/14 0431 05/14/14 0600 05/14/14 0800  BP: 135/31  118/28 154/33  Pulse: 48  47 49  Temp:  97.5 F (36.4 C)  98.5 F (36.9 C)  TempSrc:  Oral  Oral  Resp: 14  14 16   Height:      Weight:  121 lb 4.1 oz (55 kg)    SpO2: 93%  92% 97%    GEN- The patient is elderly and fragile appearing, alert and oriented x 3 today.   Head- normocephalic, atraumatic Eyes-  Sclera clear, conjunctiva pink Ears- hearing intact Oropharynx- clear Neck- supple, Lungs- Clear to ausculation bilaterally, normal work of breathing Heart- Regular rate and rhythm  GI- soft, NT, ND, + BS Extremities- no clubbing,  cyanosis, or edema  MS- diffuse muscle atrophy Skin- no rash or lesion Psych- euthymic mood, full affect Neuro- strength and sensation are intact   Labs:   Lab Results  Component Value Date   WBC 4.6 05/14/2014   HGB 9.7* 05/14/2014   HCT 27.9* 05/14/2014   MCV 100.7* 05/14/2014   PLT 186 05/14/2014    Recent Labs Lab 05/13/14 1925 05/14/14 0306  NA 135* 136*  K 4.4 4.0  CL 101 101  CO2 20 24  BUN 25* 24*  CREATININE 0.76 0.81  CALCIUM 9.0 8.6  PROT 6.5  --   BILITOT 0.5  --   ALKPHOS 44  --   ALT 9  --   AST 23  --   GLUCOSE 95 81    Radiology/Studies: Dg Chest Port 1 View 05/13/2014   CLINICAL DATA:  Short of breath.  EXAM: PORTABLE CHEST - 1 VIEW  COMPARISON:  04/23/2014.  FINDINGS: Cardiopericardial silhouette within normal limits. CABG/median sternotomy. Monitoring leads project over the chest. There is pulmonary vascular congestion and diffuse prominence of the interstitium consistent with interstitial pulmonary edema. Lung volumes are lower than on prior study. Chronic LEFT rotator cuff tear with high-riding humeral head and glenohumeral osteoarthritis. There is no focal consolidation to suggest pneumonia. Surgical clip present over the LEFT breast.  IMPRESSION: Low volume chest with interstitial opacity most compatible with interstitial pulmonary edema.   Electronically Signed   By: Dereck Ligas M.D.   On: 05/13/2014 17:46    EKG:05-13-14 2:1 heart block, ventricular rate 32, QRS 96  TELEMETRY:  Sinus brady with blocked PAC's; intermittent 2:1 heart block  A/P Mobitz II second degree AV block The patient has symptomatic second degree AV block with 2:1 conduction.  This persists intermittently despite washout of coreg.  I have spoken with Dr Angelena Form who feels that she requires beta blocker therapy chronically for management of her CAD.  I would therefore recommend pacemaker implantation at this time.  Risks, benefits, alternatives to pacemaker implantation were  discussed in detail with the patient and her daughter today. They understand that the risks include but are not limited to bleeding, infection, pneumothorax, perforation, tamponade, vascular damage, renal failure, MI, stroke, death,  and lead dislodgement and wishes to proceed at this time.

## 2014-05-14 NOTE — Op Note (Signed)
SURGEON:  [name], MD     PREPROCEDURE DIAGNOSIS:  Symptomatic mobitz II second degree AV block    POSTPROCEDURE DIAGNOSIS:   Symptomatic mobitz II second degree AV block     PROCEDURES:   1. Left upper extremity venography.   2. Pacemaker implantation.     INTRODUCTION:  Stephanie Cochran is a 80 y.o. female with a history of symptomatic mobitz II second degree AV block who presents today for pacemaker implantation.  The patient reports intermittent episodes of shortness of breath and fatigue over the past few weeks.  She presented with these symptoms and was found to have 2:1 AV block.  No reversible causes have been identified.  The patient therefore presents today for pacemaker implantation.     DESCRIPTION OF PROCEDURE:  Informed written consent was obtained, and  the patient was brought to the electrophysiology lab in a fasting state.  The patient required no sedation for the procedure today.  The patients left chest was prepped and draped in the usual sterile fashion by the EP lab staff. The skin overlying the left deltopectoral region was infiltrated with lidocaine for local analgesia.  A 4-cm incision was made over the left deltopectoral region.  A left subcutaneous pacemaker pocket was fashioned using a combination of sharp and blunt dissection. Electrocautery was required to assure hemostasis.    Left Upper Extremity Venography: A venogram of the left upper extremity was performed, which revealed a very small left cephalic vein, which emptied into a small left subclavian vein.  The left axillary vein was small in size.    RA/RV Lead Placement: The left axillary vein was therefore cannulated.  Through the left axillary vein, a [employer] model 2088 TC-46 (serial number  [id number]) right atrial lead and a [employer] Isoflex model [id number] (serial number  [id number]) right ventricular lead were advanced with fluoroscopic visualization into the right atrial appendage and right  ventricular apex positions respectively.  Initial atrial lead P- waves measured 1.6 mV with impedance of 577 ohms and a threshold of 1.6 V at 0.5 msec.  Right ventricular lead R-waves measured 5.9 mV with an impedance of 735 ohms and a threshold of 0.5 V at 0.5 msec.  Both leads were secured to the pectoralis fascia using #2-0 silk over the suture sleeves.   Device Placement:  The leads were then connected to a [place] DR  model [id number] (serial number  [id number] ) pacemaker.  The pocket was irrigated with copious gentamicin solution.  The pacemaker was then placed into the pocket.  The pocket was then closed in 2 layers with 2.0 Vicryl suture for the subcutaneous and subcuticular layers.  Steri-  Strips and a sterile dressing were then applied.  There were no early apparent complications.     CONCLUSIONS:   1. Successful implantation of a [employer] Assurity DR  dual-chamber pacemaker for symptomatic mobitz II second degree AV block  2. No early apparent complications.           [name], MD 05/14/2014 3:22 PM

## 2014-05-14 NOTE — H&P (View-Only) (Signed)
ELECTROPHYSIOLOGY CONSULT NOTE    Patient ID: Stephanie Cochran MRN: [id number], DOB/AGE: [birth year] 80 y.o.  Admit date: 05/13/2014 Date of Consult: 05-14-14  Primary Physician: Cari Caraway, MD Primary Cardiologist: Meda Coffee  Reason for Consultation: symptomatic bradycardia  HPI:  Stephanie Cochran is a 80 y.o. female with a past medical history significant for CAD (s/p CABG), renal artery stenosis (s/p stenting), hyperlipidemia, atrial fibrillation (post CABG), prior AVR, and breast cancer (s/p left mastectomy).  She has had progressive dyspnea on exertion over the past month.  Plans were for outpatient echo and myoview.  When the patient presented for [facility], she was found to be in 2:1 heart block and was referred to [facility] for further evaluation.   She is on Carvedilol 6.25 twice daily at home with last dose 05-12-14 afternoon.  AV block persists intermittently despite washout of this medicine.    She has had at least 2 falls in the last month.  She denies frank syncope, but does report dizziness prior to falling.  She is still very active and travelled to [place] recently with her family.  She denies chest pain, lower extremity edema, recent fevers, chills, nausea or vomiting.  ROS is otherwise negative.   Echo 05-13-14 demonstrated EF 60-65%, no RWMA, mild mitral stenosis, moderate MR, LA 45.   EP has been asked to evaluate for treatment options.   Past Medical History  Diagnosis Date  . CAD (coronary artery disease)     s/p CABG 2007 at [facility]  . HTN (hypertension)   . PVD (peripheral vascular disease)   . Diverticulosis     history  . HLD (hyperlipidemia)   . Atrial fibrillation     post operative  . Atrial flutter     post operative  . Cellulitis     left forearm  . Aortic stenosis     a. s/p bioprosthetic AVR 2007 [facility]);  b. Echo (04/2009 - [facility] in [place], [place]):  Mild LVH, normal LVF, anterior WMA, AVR ok (mean 4 mmHg), MAC, mild MR, mild TV stenosis, RVSP 44 mmHg    . Breast cancer     left mastectomy  . Post-menopausal   . Arthritis      Surgical History:  Past Surgical History  Procedure Laterality Date  . Renal artery stent  2006, 2010  . Aortic valve replacement  2007    with 20mm [facility] pericardial tissue valve   . Coronary artery bypass graft  2007    x3, left internal mammary artery to the left anterior descending, saphenous vein graft tot he circumflex coronary  . Arteriovenous graft placement w/ endoscopic vein harvest      left leg  . Mastectomy  2008    left, breast cancer, followed by a hematology oncologist  . Tonsillectomy  1943  . Hemorroidectomy    . Breast surgery    . Eye surgery    . Cardiac valve replacement       Prescriptions prior to admission  Medication Sig Dispense Refill  . amLODipine (NORVASC) 10 MG tablet Take 10 mg by mouth Daily.       Marland Kitchen aspirin EC 81 MG tablet Take 81 mg by mouth daily.      . carvedilol (COREG) 6.25 MG tablet Take 6.25 mg by mouth daily.      . clopidogrel (PLAVIX) 75 MG tablet Take 75 mg by mouth daily.      . Cyanocobalamin (B-12) 1000 MCG CAPS Take 1,000 mcg by mouth  daily.       . diphenhydramine-acetaminophen (TYLENOL PM) 25-500 MG TABS Take 0.5 tablets by mouth at bedtime.      . hydrochlorothiazide (HYDRODIURIL) 25 MG tablet Take 12.5 mg by mouth 2 (two) times daily.       Marland Kitchen ibuprofen (ADVIL,MOTRIN) 200 MG tablet Take 400 mg by mouth daily as needed (back pain).      Marland Kitchen letrozole (FEMARA) 2.5 MG tablet Take 2.5 mg by mouth daily.      Marland Kitchen olmesartan (BENICAR) 20 MG tablet Take 1 tablet (20 mg total) by mouth daily.  30 tablet  11  . Polyethyl Glycol-Propyl Glycol (SYSTANE OP) Place 1 drop into both eyes 2 (two) times daily as needed (dry eyes).      . vitamin C (ASCORBIC ACID) 500 MG tablet Take 1,000 mg by mouth daily.      . Vitamin D, Ergocalciferol, (DRISDOL) 50000 UNITS CAPS Take 50,000 Units by mouth every 30 (thirty) days. On or about the 1st of each month      .  vitamin E 400 UNIT capsule Take 400 Units by mouth daily.        Inpatient Medications:  . amLODipine  10 mg Oral Daily  . aspirin EC  81 mg Oral Daily  . clopidogrel  75 mg Oral Daily  . hydrochlorothiazide  12.5 mg Oral BID  . irbesartan  75 mg Oral Daily  . letrozole  2.5 mg Oral Daily  . vitamin C  1,000 mg Oral Daily    Allergies:  Allergies  Allergen Reactions  . Codeine Nausea And Vomiting  . Penicillins Nausea And Vomiting  . Vicodin [Hydrocodone-Acetaminophen] Nausea And Vomiting    History   Social History  . Marital Status: Widowed    Spouse Name: N/A    Number of Children: N/A  . Years of Education: N/A   Occupational History  . Not on file.   Social History Main Topics  . Smoking status: Former Smoker    Quit date: 09/27/1960  . Smokeless tobacco: Never Used  . Alcohol Use: No  . Drug Use: No  . Sexual Activity: Not on file   Other Topics Concern  . Not on file   Social History Narrative  . No narrative on file     Family History  Problem Relation Age of Onset  . Colon cancer Sister   . Diabetes    . Heart disease    . Arthritis      BP 118/28  Pulse 47  Temp(Src) 97.5 F (36.4 C) (Oral)  Resp 14  Ht 5\' 2"  (1.575 m)  Wt 121 lb 4.1 oz (55 kg)  BMI 22.17 kg/m2  SpO2 92%  Physical Exam: Physical Exam: Filed Vitals:   05/14/14 0400 05/14/14 0431 05/14/14 0600 05/14/14 0800  BP: 135/31  118/28 154/33  Pulse: 48  47 49  Temp:  97.5 F (36.4 C)  98.5 F (36.9 C)  TempSrc:  Oral  Oral  Resp: 14  14 16   Height:      Weight:  121 lb 4.1 oz (55 kg)    SpO2: 93%  92% 97%    GEN- The patient is elderly and fragile appearing, alert and oriented x 3 today.   Head- normocephalic, atraumatic Eyes-  Sclera clear, conjunctiva pink Ears- hearing intact Oropharynx- clear Neck- supple, Lungs- Clear to ausculation bilaterally, normal work of breathing Heart- Regular rate and rhythm  GI- soft, NT, ND, + BS Extremities- no clubbing,  cyanosis, or edema  MS- diffuse muscle atrophy Skin- no rash or lesion Psych- euthymic mood, full affect Neuro- strength and sensation are intact   Labs:   Lab Results  Component Value Date   WBC 4.6 05/14/2014   HGB 9.7* 05/14/2014   HCT 27.9* 05/14/2014   MCV 100.7* 05/14/2014   PLT 186 05/14/2014    Recent Labs Lab 05/13/14 1925 05/14/14 0306  NA 135* 136*  K 4.4 4.0  CL 101 101  CO2 20 24  BUN 25* 24*  CREATININE 0.76 0.81  CALCIUM 9.0 8.6  PROT 6.5  --   BILITOT 0.5  --   ALKPHOS 44  --   ALT 9  --   AST 23  --   GLUCOSE 95 81    Radiology/Studies: Dg Chest Port 1 View 05/13/2014   CLINICAL DATA:  Short of breath.  EXAM: PORTABLE CHEST - 1 VIEW  COMPARISON:  04/23/2014.  FINDINGS: Cardiopericardial silhouette within normal limits. CABG/median sternotomy. Monitoring leads project over the chest. There is pulmonary vascular congestion and diffuse prominence of the interstitium consistent with interstitial pulmonary edema. Lung volumes are lower than on prior study. Chronic LEFT rotator cuff tear with high-riding humeral head and glenohumeral osteoarthritis. There is no focal consolidation to suggest pneumonia. Surgical clip present over the LEFT breast.  IMPRESSION: Low volume chest with interstitial opacity most compatible with interstitial pulmonary edema.   Electronically Signed   By: Dereck Ligas M.D.   On: 05/13/2014 17:46    EKG:05-13-14 2:1 heart block, ventricular rate 32, QRS 96  TELEMETRY:  Sinus brady with blocked PAC's; intermittent 2:1 heart block  A/P Mobitz II second degree AV block The patient has symptomatic second degree AV block with 2:1 conduction.  This persists intermittently despite washout of coreg.  I have spoken with Dr Angelena Form who feels that she requires beta blocker therapy chronically for management of her CAD.  I would therefore recommend pacemaker implantation at this time.  Risks, benefits, alternatives to pacemaker implantation were  discussed in detail with the patient and her daughter today. They understand that the risks include but are not limited to bleeding, infection, pneumothorax, perforation, tamponade, vascular damage, renal failure, MI, stroke, death,  and lead dislodgement and wishes to proceed at this time.

## 2014-05-15 ENCOUNTER — Inpatient Hospital Stay (HOSPITAL_COMMUNITY): Payer: Medicare Other

## 2014-05-15 LAB — BASIC METABOLIC PANEL
Anion gap: 12 (ref 5–15)
BUN: 16 mg/dL (ref 6–23)
CALCIUM: 8.3 mg/dL — AB (ref 8.4–10.5)
CO2: 22 meq/L (ref 19–32)
Chloride: 97 mEq/L (ref 96–112)
Creatinine, Ser: 0.8 mg/dL (ref 0.50–1.10)
GFR calc Af Amer: 71 mL/min — ABNORMAL LOW (ref >90)
GFR calc non Af Amer: 61 mL/min — ABNORMAL LOW (ref >90)
Glucose, Bld: 91 mg/dL (ref 70–99)
Potassium: 3.9 mEq/L (ref 3.7–5.3)
SODIUM: 131 meq/L — AB (ref 137–147)

## 2014-05-15 MED ORDER — YOU HAVE A PACEMAKER BOOK
Freq: Once | Status: AC
  Administered 2014-05-15: 02:00:00
  Filled 2014-05-15: qty 1

## 2014-05-15 NOTE — Discharge Instructions (Signed)
° ° °  Supplemental Discharge Instructions for  Pacemaker/Defibrillator Patients  Activity No heavy lifting or vigorous activity with your left/right arm for 6 to 8 weeks.  Do not raise your left/right arm above your head for one week.  Gradually raise your affected arm as drawn below.               8/20                      8/21                         8/22                         8/23 __  NO DRIVING for 1 week you may begin driving on 1/61/09  WOUND CARE   Keep the wound area clean and dry.  Do not get this area wet for one week. No showers for one week; you may shower on     .   The tape/steri-strips on your wound will fall off; do not pull them off.  No bandage is needed on the site.  DO  NOT apply any creams, oils, or ointments to the wound area.   If you notice any drainage or discharge from the wound, any swelling or bruising at the site, or you develop a fever > 101? F after you are discharged home, call the office at once.  Special Instructions   You are still able to use cellular telephones; use the ear opposite the side where you have your pacemaker/defibrillator.  Avoid carrying your cellular phone near your device.   When traveling through airports, show security personnel your identification card to avoid being screened in the metal detectors.  Ask the security personnel to use the hand wand.   Avoid arc welding equipment, MRI testing (magnetic resonance imaging), TENS units (transcutaneous nerve stimulators).  Call the office for questions about other devices.   Avoid electrical appliances that are in poor condition or are not properly grounded.   Microwave ovens are safe to be near or to operate.  Additional information for defibrillator patients should your device go off:   If your device goes off ONCE and you feel fine afterward, notify the device clinic nurses.   If your device goes off ONCE and you do not feel well afterward, call 911.   If your device goes off TWICE,  call 911.   If your device goes off THREE times in one day, call 911.  DO NOT DRIVE YOURSELF OR A FAMILY MEMBER WITH A DEFIBRILLATOR TO THE HOSPITAL--CALL 911.

## 2014-05-15 NOTE — Discharge Summary (Signed)
ELECTROPHYSIOLOGY PROCEDURE DISCHARGE SUMMARY    Patient ID: AGAM TUOHY,  MRN: [id number], DOB/AGE: 1918/04/03 80 y.o.  Admit date: 05/13/2014 Discharge date: 05/15/2014  Primary Care Physician: Cari Caraway, MD Primary Cardiologist: Meda Coffee Electrophysiologist: Nadie Fiumara  Primary Discharge Diagnosis:  Symptomatic Mobitz II heart block status post pacemaker implantation this admission  Secondary Discharge Diagnosis:  1.  CAD s/p CABG 2.  Renal artery stenosis s/p stenting 3.  Hyperlipidemia 4.  Prior AVR 5.  Breast cancer s/p left mastectomy  Allergies  Allergen Reactions  . Codeine Nausea And Vomiting  . Penicillins Nausea And Vomiting  . Vicodin [Hydrocodone-Acetaminophen] Nausea And Vomiting    Procedures This Admission:  1. Implantation of a dual chamber pacemaker on 05-14-14 by Dr Rayann Heman.  The patient received a STJ Assurity dual chamber pacemaker with model number 2088 right atrial lead and model number 1948 right ventricular lead.  There were no early immediate complications.  2.  CXR on 05-15-2014 demonstrated stable lead position, no obvious ptx  Brief HPI/Hospital Course:  Stephanie Cochran is a 80 y.o. female with a past medical history significant for CAD (s/p CABG), renal artery stenosis (s/p stenting), hyperlipidemia, atrial fibrillation (post CABG), prior AVR, and breast cancer (s/p left mastectomy). She has had progressive dyspnea on exertion over the past month. Plans were for outpatient echo and myoview. When the patient presented for [facility], she was found to be in 2:1 heart block and was referred to [facility] for further evaluation.  She is on Carvedilol 6.25 twice daily at home with last dose 05-12-14 afternoon. AV block persists intermittently despite washout of this medicine.  She has had at least 2 falls in the last month. She denies frank syncope, but does report dizziness prior to falling. She is still very active and travelled to [place] recently with her  family. She denies chest pain, lower extremity edema, recent fevers, chills, nausea or vomiting. ROS is otherwise negative.  Echo 05-13-14 demonstrated EF 60-65%, no RWMA, mild mitral stenosis, moderate MR, LA 45.   She was evaluated by Dr Rayann Heman with EP who recommended pacemaker implantation.  Risks, benefits, and alternatives were reviewed with the patient who wished to proceed. The patient underwent implantation of a STJ dual chamber pacemaker with details as outlined above.   She was monitored on telemetry overnight which demonstrated AV pacing.  Left chest was without hematoma or ecchymosis.  The device was interrogated and found to be functioning normally.  CXR was obtained and demonstrated no pneumothorax status post device implantation.  Wound care, arm mobility, and restrictions were reviewed with the patient.  Dr Rayann Heman examined the patient and considered them stable for discharge to home.   She was noted to be anemic on lab work.  No melena, other obvious bleeding.  Pt advised to follow up with PCP regarding anemia.   Discharge Vitals: Blood pressure 149/54, pulse 60, temperature 98.1 F (36.7 C), temperature source Oral, resp. rate 20, height 5\' 2"  (1.575 m), weight 117 lb 11.6 oz (53.4 kg), SpO2 98.00%.  Physical Exam: Filed Vitals:   05/14/14 1608 05/14/14 2136 05/15/14 0023 05/15/14 0545  BP: 168/48 129/71 142/38 149/54  Pulse: 60 60 60 60  Temp: 97.6 F (36.4 C) 97.9 F (36.6 C)  98.1 F (36.7 C)  TempSrc: Oral Oral  Oral  Resp: 16 18 18 20   Height:      Weight:    117 lb 11.6 oz (53.4 kg)  SpO2: 97% 98% 94% 98%  Physical Exam: Filed Vitals:   05/14/14 1608 05/14/14 2136 05/15/14 0023 05/15/14 0545  BP: 168/48 129/71 142/38 149/54  Pulse: 60 60 60 60  Temp: 97.6 F (36.4 C) 97.9 F (36.6 C)  98.1 F (36.7 C)  TempSrc: Oral Oral  Oral  Resp: 16 18 18 20   Height:      Weight:    117 lb 11.6 oz (53.4 kg)  SpO2: 97% 98% 94% 98%    GEN- The patient is well  appearing, alert and oriented x 3 today.   Head- normocephalic, atraumatic Eyes-  Sclera clear, conjunctiva pink Ears- hearing intact Oropharynx- clear Neck- supple, Lungs- Clear to ausculation bilaterally, normal work of breathing Heart- Regular rate and rhythm, no murmurs, rubs or gallops, PMI not laterally displaced GI- soft, NT, ND, + BS Extremities- no clubbing, cyanosis, or edema, groin is without hematoma/ bruit MS- no significant deformity or atrophy Skin- pacemaker pocket without hematoma   Labs:   Lab Results  Component Value Date   WBC 4.6 05/14/2014   HGB 9.7* 05/14/2014   HCT 27.9* 05/14/2014   MCV 100.7* 05/14/2014   PLT 186 05/14/2014    Recent Labs Lab 05/13/14 1925  05/15/14 0303  NA 135*  < > 131*  K 4.4  < > 3.9  CL 101  < > 97  CO2 20  < > 22  BUN 25*  < > 16  CREATININE 0.76  < > 0.80  CALCIUM 9.0  < > 8.3*  PROT 6.5  --   --   BILITOT 0.5  --   --   ALKPHOS 44  --   --   ALT 9  --   --   AST 23  --   --   GLUCOSE 95  < > 91  < > = values in this interval not displayed.  Discharge Medications:    Medication List    ASK your doctor about these medications       amLODipine 10 MG tablet  Commonly known as:  NORVASC  Take 10 mg by mouth Daily.     aspirin EC 81 MG tablet  Take 81 mg by mouth daily.     B-12 1000 MCG Caps  Take 1,000 mcg by mouth daily.     carvedilol 6.25 MG tablet  Commonly known as:  COREG  Take 6.25 mg by mouth daily.     clopidogrel 75 MG tablet  Commonly known as:  PLAVIX  Take 75 mg by mouth daily.     diphenhydramine-acetaminophen 25-500 MG Tabs  Commonly known as:  TYLENOL PM  Take 0.5 tablets by mouth at bedtime.     hydrochlorothiazide 25 MG tablet  Commonly known as:  HYDRODIURIL  Take 12.5 mg by mouth 2 (two) times daily.     ibuprofen 200 MG tablet  Commonly known as:  ADVIL,MOTRIN  Take 400 mg by mouth daily as needed (back pain).     letrozole 2.5 MG tablet  Commonly known as:  FEMARA  Take  2.5 mg by mouth daily.     olmesartan 20 MG tablet  Commonly known as:  BENICAR  Take 1 tablet (20 mg total) by mouth daily.     SYSTANE OP  Place 1 drop into both eyes 2 (two) times daily as needed (dry eyes).     vitamin C 500 MG tablet  Commonly known as:  ASCORBIC ACID  Take 1,000 mg by mouth daily.     Vitamin D (  Ergocalciferol) 50000 UNITS Caps capsule  Commonly known as:  DRISDOL  Take 50,000 Units by mouth every 30 (thirty) days. On or about the 1st of each month     vitamin E 400 UNIT capsule  Take 400 Units by mouth daily.        Disposition:  Discharge to home today   Duration of Discharge Encounter: Greater than 30 minutes including physician time.  Signed, [name], MD

## 2014-05-18 ENCOUNTER — Encounter: Payer: Self-pay | Admitting: Physician Assistant

## 2014-05-20 ENCOUNTER — Encounter: Payer: Self-pay | Admitting: *Deleted

## 2014-05-23 ENCOUNTER — Ambulatory Visit (INDEPENDENT_AMBULATORY_CARE_PROVIDER_SITE_OTHER): Payer: Medicare Other | Admitting: Cardiology

## 2014-05-23 ENCOUNTER — Ambulatory Visit (INDEPENDENT_AMBULATORY_CARE_PROVIDER_SITE_OTHER): Payer: Medicare Other | Admitting: *Deleted

## 2014-05-23 ENCOUNTER — Encounter: Payer: Self-pay | Admitting: Cardiology

## 2014-05-23 VITALS — BP 144/60 | HR 59 | Ht 62.0 in | Wt 118.0 lb

## 2014-05-23 DIAGNOSIS — I2510 Atherosclerotic heart disease of native coronary artery without angina pectoris: Secondary | ICD-10-CM

## 2014-05-23 DIAGNOSIS — I441 Atrioventricular block, second degree: Secondary | ICD-10-CM

## 2014-05-23 LAB — MDC_IDC_ENUM_SESS_TYPE_INCLINIC
Battery Remaining Longevity: 76.8 mo
Battery Voltage: 3.07 V
Lead Channel Impedance Value: 400 Ohm
Lead Channel Pacing Threshold Amplitude: 0.5 V
Lead Channel Pacing Threshold Pulse Width: 0.5 ms
Lead Channel Sensing Intrinsic Amplitude: 1.8 mV
Lead Channel Sensing Intrinsic Amplitude: 7.5 mV
Lead Channel Setting Pacing Amplitude: 0.75 V
Lead Channel Setting Pacing Amplitude: 3.5 V
Lead Channel Setting Pacing Pulse Width: 0.5 ms
Lead Channel Setting Sensing Sensitivity: 3 mV
MDC IDC MSMT LEADCHNL RA PACING THRESHOLD AMPLITUDE: 0.5 V
MDC IDC MSMT LEADCHNL RA PACING THRESHOLD AMPLITUDE: 0.5 V
MDC IDC MSMT LEADCHNL RA PACING THRESHOLD PULSEWIDTH: 0.5 ms
MDC IDC MSMT LEADCHNL RA PACING THRESHOLD PULSEWIDTH: 0.5 ms
MDC IDC MSMT LEADCHNL RV IMPEDANCE VALUE: 625 Ohm
MDC IDC PG SERIAL: 7663995
MDC IDC SESS DTM: 20150827124532
MDC IDC STAT BRADY RA PERCENT PACED: 91 %
MDC IDC STAT BRADY RV PERCENT PACED: 93 %

## 2014-05-23 NOTE — Patient Instructions (Signed)
Your physician recommends that you continue on your current medications as directed. Please refer to the Current Medication list given to you today.    Your physician recommends that you schedule a follow-up appointment in: 2 MONTHS WITH DR NELSON  

## 2014-05-23 NOTE — Progress Notes (Signed)
Patient ID: Stephanie Cochran, female   DOB: 09-08-1918, 80 y.o.   MRN: [id number]   Cardiology Office Note    Date:  05/23/2014   ID:  DREW HERMAN, DOB 1918/06/16, MRN [id number]  PCP:  Cari Caraway, MD  Cardiologist:  Dr. Jenell Milliner >>> Dr. Ena Dawley      History of Present Illness:  Primary Discharge Diagnosis:  Symptomatic Mobitz II heart block status post pacemaker implantation this admission  Secondary Discharge Diagnosis:  1. CAD s/p CABG  2. Renal artery stenosis s/p stenting  3. Hyperlipidemia  4. Prior AVR  5. Breast cancer s/p left mastectomy  Procedures This Admission:  1. Implantation of a dual chamber pacemaker on 05-14-14 by Dr Rayann Heman. The patient received a STJ Assurity dual chamber pacemaker with model number 2088 right atrial lead and model number [id number] right ventricular lead. There were no early immediate complications.  2. CXR on 05-15-2014 demonstrated stable lead position, no obvious ptx   Brief HPI/Hospital Course:   Stephanie Cochran is a 80 y.o. female with a past medical history significant for CAD (s/p CABG), renal artery stenosis (s/p stenting), hyperlipidemia, atrial fibrillation (post CABG), prior AVR, and breast cancer (s/p left mastectomy). She has had progressive dyspnea on exertion over the past month. Plans were for outpatient echo and myoview. When the patient presented for [facility], she was found to be in 2:1 heart block and was referred to [facility] for further evaluation.  She is on Carvedilol 6.25 twice daily at home with last dose 05-12-14 afternoon. AV block persists intermittently despite washout of this medicine.  She has had at least 2 falls in the last month. She denies frank syncope, but does report dizziness prior to falling. She is still very active and travelled to [place] recently with her family. She denies chest pain, lower extremity edema, recent fevers, chills, nausea or vomiting. ROS is otherwise negative.  Echo 05-13-14 demonstrated  EF 60-65%, no RWMA, mild mitral stenosis, moderate MR, LA 45.  She was evaluated by Dr Rayann Heman with EP who recommended pacemaker implantation. Risks, benefits, and alternatives were reviewed with the patient who wished to proceed. The patient underwent implantation of a STJ dual chamber pacemaker with details as outlined above. She was monitored on telemetry overnight which demonstrated AV pacing. Left chest was without hematoma or ecchymosis. The device was interrogated and found to be functioning normally. CXR was obtained and demonstrated no pneumothorax status post device implantation. Wound care, arm mobility, and restrictions were reviewed with the patient. Dr Rayann Heman examined the patient and considered them stable for discharge to home.  She was noted to be anemic on lab work. No melena, other obvious bleeding. Pt advised to follow up with PCP regarding anemia.   05/23/2014 - this is the first post hospitalization visit, the patient denies any dizziness, syncope, falls. She walks with a walker. No chest pain or SOB. She has had PM ceck this am. She is still sore at the insertion site and has mild swelling.   Recent Labs:  Wt Readings from Last 3 Encounters:  05/23/14 118 lb (53.524 kg)  05/15/14 117 lb 11.6 oz (53.4 kg)  05/15/14 117 lb 11.6 oz (53.4 kg)     Past Medical History  Diagnosis Date  . CAD (coronary artery disease)     s/p CABG 2007 at [facility]  . HTN (hypertension)   . PVD (peripheral vascular disease)   . Diverticulosis     history  . HLD (hyperlipidemia)   .  Atrial fibrillation     post operative  . Atrial flutter     post operative  . Cellulitis     left forearm  . Aortic stenosis     a. s/p bioprosthetic AVR 2007 [facility]);  b. Echo (04/2009 - [facility] in [place], [place]):  Mild LVH, normal LVF, anterior WMA, AVR ok (mean 4 mmHg), MAC, mild MR, mild TV stenosis, RVSP 44 mmHg  . Breast cancer     left mastectomy  . Post-menopausal   . Arthritis   . Pacemaker 05/14/2014     dual chamber st jude   Dr Joylene Grapes  . Pneumonia     hx of pna  . Dysrhythmia 04/2014    second degree heart block  . Hx of echocardiogram     Echo (8/15):  Mild focal basal septal hypertrophy, EF 60-65%, no RWMA, AVR ok (mean 8 mmHg), MAC, mild MS (mean 4 mmHg), mod MR, mild LAE, PASP 54 mmHg    Current Outpatient Prescriptions  Medication Sig Dispense Refill  . amLODipine (NORVASC) 10 MG tablet Take 10 mg by mouth Daily.       Marland Kitchen aspirin EC 81 MG tablet Take 81 mg by mouth daily.      . carvedilol (COREG) 6.25 MG tablet Take 6.25 mg by mouth daily.      . clopidogrel (PLAVIX) 75 MG tablet Take 75 mg by mouth daily.      . Cyanocobalamin (B-12) 1000 MCG CAPS Take 1,000 mcg by mouth daily.       . diphenhydramine-acetaminophen (TYLENOL PM) 25-500 MG TABS Take 0.5 tablets by mouth at bedtime.      . hydrochlorothiazide (HYDRODIURIL) 25 MG tablet Take 12.5 mg by mouth 2 (two) times daily.       Marland Kitchen ibuprofen (ADVIL,MOTRIN) 200 MG tablet Take 400 mg by mouth daily as needed (back pain).      Marland Kitchen letrozole (FEMARA) 2.5 MG tablet Take 2.5 mg by mouth daily.      Marland Kitchen olmesartan (BENICAR) 20 MG tablet Take 1 tablet (20 mg total) by mouth daily.  30 tablet  11  . Polyethyl Glycol-Propyl Glycol (SYSTANE OP) Place 1 drop into both eyes 2 (two) times daily as needed (dry eyes).      . vitamin C (ASCORBIC ACID) 500 MG tablet Take 1,000 mg by mouth daily.      . Vitamin D, Ergocalciferol, (DRISDOL) 50000 UNITS CAPS Take 50,000 Units by mouth every 30 (thirty) days. On or about the 1st of each month      . vitamin E 400 UNIT capsule Take 400 Units by mouth daily.       No current facility-administered medications for this visit.    Allergies:   Codeine; Penicillins; and Vicodin   Social History:  The patient  reports that she quit smoking about 53 years ago. She has never used smokeless tobacco. She reports that she does not drink alcohol or use illicit drugs.   Family History:  The patient's family  history includes Arthritis in an other family member; Colon cancer in her sister; Diabetes in an other family member; Heart disease in an other family member.   ROS:  Please see the history of present illness.      All other systems reviewed and negative.   PHYSICAL EXAM: VS:  BP 144/60  Pulse 59  Ht 5\' 2"  (1.575 m)  Wt 118 lb (53.524 kg)  BMI 21.58 kg/m2  SpO2 99% Well nourished, well developed, in  no acute distress HEENT: normal Neck: no JVD Lef upper chest wall, swelling around the PM insertion site, no increase warmth  Cardiac:  normal S1, S2; RRR; 2/6 systolic murmurRUSB Lungs:  clear to auscultation bilaterally, no wheezing, rhonchi or rales Abd: soft, nontender, no hepatomegaly Ext: trace to 1+ bilateral ankle edema Skin: warm and dry Neuro:  CNs 2-12 intact, no focal abnormalities noted  EKG:  Sinus brady, HR 52, LAD, 1st degree AVB, no change from prior tracing    ASSESSMENT AND PLAN:  1. Symptomatic Mobitz II heart block status post pacemaker implantation on 05/13/2014  - mild edema at the insertion site, no sign of infection, functioning well based on the interrogation, resolved symptoms  2. Coronary artery disease:  Asymptomatic, Continue ASA, beta blocker.  She is not on statin Rx for unclear reasons.    3. History of aortic valve replacement with bioprosthetic valve - Plan: 2D Echocardiogram without contrast  4. History of renal artery stenosis:  Previously followed by [facility].  If creatinine elevated, consider repeat US.    5. Essential hypertension:  BP ok for her age   40. Hyperlipidemia:  Will revisit at follow up and start statin if no contraindication.    Follow up in 2 months  Dorothy Spark 05/23/2014

## 2014-05-23 NOTE — Progress Notes (Signed)
Wound check appointment. Steri-strips removed. Wound without redness.  Hematoma noted.  Incision edges approximated, wound well healed. Normal device function. Thresholds, sensing, and impedances consistent with implant measurements. Device programmed at 3.5V/auto capture programmed on for extra safety margin until 3 month visit. Histogram distribution appropriate for patient and level of activity. No mode switches or high ventricular rates noted. Patient educated about wound care, arm mobility, lifting restrictions. ROV in 3 months with implanting physician.  Patient was instructed to call the office if she notices further swelling at the site.

## 2014-06-04 ENCOUNTER — Encounter: Payer: Self-pay | Admitting: Internal Medicine

## 2014-07-24 ENCOUNTER — Encounter: Payer: Self-pay | Admitting: Cardiology

## 2014-07-24 ENCOUNTER — Ambulatory Visit (INDEPENDENT_AMBULATORY_CARE_PROVIDER_SITE_OTHER): Payer: Medicare Other | Admitting: Cardiology

## 2014-07-24 VITALS — BP 122/56 | HR 63 | Ht 62.0 in | Wt 121.6 lb

## 2014-07-24 DIAGNOSIS — I10 Essential (primary) hypertension: Secondary | ICD-10-CM

## 2014-07-24 DIAGNOSIS — I2510 Atherosclerotic heart disease of native coronary artery without angina pectoris: Secondary | ICD-10-CM

## 2014-07-24 DIAGNOSIS — R109 Unspecified abdominal pain: Secondary | ICD-10-CM

## 2014-07-24 DIAGNOSIS — R140 Abdominal distension (gaseous): Secondary | ICD-10-CM

## 2014-07-24 DIAGNOSIS — K5900 Constipation, unspecified: Secondary | ICD-10-CM

## 2014-07-24 MED ORDER — FLUTICASONE PROPIONATE 50 MCG/ACT NA SUSP: 1.0000 | Freq: Every day | NASAL | Status: DC

## 2014-07-24 MED ORDER — ESOMEPRAZOLE MAGNESIUM 20 MG PO PACK: 20.0000 mg | PACK | Freq: Every day | ORAL | Status: DC

## 2014-07-24 NOTE — Progress Notes (Signed)
Patient ID: Stephanie Cochran, female   DOB: 1918/06/19, 80 y.o.   MRN: [id number]    Cardiology Office Note  Date:  07/24/2014   ID:  Stephanie Cochran, DOB 1918/07/27, MRN [id number]  PCP:  Cari Caraway, MD  Cardiologist:  Dr. Jenell Milliner >>> Dr. Ena Dawley     History of Present Illness:  Primary Discharge Diagnosis:  Symptomatic Mobitz II heart block status post pacemaker implantation this admission   Secondary Discharge Diagnosis:  1. CAD s/p CABG  2. Renal artery stenosis s/p stenting  3. Hyperlipidemia  4. Prior AVR  5. Breast cancer s/p left mastectomy  Procedures This Admission:  1. Implantation of a dual chamber pacemaker on 05-14-14 by Dr Rayann Heman. The patient received a STJ Assurity dual chamber pacemaker with model number 2088 right atrial lead and model number 1948 right ventricular lead. There were no early immediate complications.  2. CXR on 05-15-2014 demonstrated stable lead position, no obvious ptx   Brief HPI/Hospital Course:   Stephanie Cochran is a 80 y.o. female with a past medical history significant for CAD (s/p CABG), renal artery stenosis (s/p stenting), hyperlipidemia, atrial fibrillation (post CABG), prior AVR, and breast cancer (s/p left mastectomy). She has had progressive dyspnea on exertion over the past month. Plans were for outpatient echo and myoview. When the patient presented for [facility], she was found to be in 2:1 heart block and was referred to [facility] for further evaluation.  She is on Carvedilol 6.25 twice daily at home with last dose 05-12-14 afternoon. AV block persists intermittently despite washout of this medicine.  She has had at least 2 falls in the last month. She denies frank syncope, but does report dizziness prior to falling. She is still very active and travelled to [place] recently with her family. She denies chest pain, lower extremity edema, recent fevers, chills, nausea or vomiting. ROS is otherwise negative.  Echo 05-13-14 demonstrated  EF 60-65%, no RWMA, mild mitral stenosis, moderate MR, LA 45.  She was evaluated by Dr Rayann Heman with EP who recommended pacemaker implantation. Risks, benefits, and alternatives were reviewed with the patient who wished to proceed. The patient underwent implantation of a STJ dual chamber pacemaker with details as outlined above. She was monitored on telemetry overnight which demonstrated AV pacing. Left chest was without hematoma or ecchymosis. The device was interrogated and found to be functioning normally. CXR was obtained and demonstrated no pneumothorax status post device implantation. Wound care, arm mobility, and restrictions were reviewed with the patient. Dr Rayann Heman examined the patient and considered them stable for discharge to home.  She was noted to be anemic on lab work. No melena, other obvious bleeding. Pt advised to follow up with PCP regarding anemia.   05/23/2014 - this is the first post hospitalization visit, the patient denies any dizziness, syncope, falls. She walks with a walker. No chest pain or SOB. She has had PM ceck this am. She is still sore at the insertion site and has mild swelling.   07/18/2014 - the patient is coming after 2 months she feels very well has no palpitations or syncope her pacemaker site healed very well and she doesn't have any pain at the implantation site. She feels retrosternal pain when she lays down in bed almost every night. She is otherwise very active still driving and walking and doing activities of daily living without any limitations. She denies any exertional chest pain. She is complaining of being congested and having nonproductive cough for  a few months now.   Recent Labs:  Wt Readings from Last 3 Encounters:  07/24/14 121 lb 9.6 oz (55.157 kg)  05/23/14 118 lb (53.524 kg)  05/15/14 117 lb 11.6 oz (53.4 kg)     Past Medical History  Diagnosis Date  . CAD (coronary artery disease)     s/p CABG 2007 at [facility]  . HTN (hypertension)   . PVD  (peripheral vascular disease)   . Diverticulosis     history  . HLD (hyperlipidemia)   . Atrial fibrillation     post operative  . Atrial flutter     post operative  . Cellulitis     left forearm  . Aortic stenosis     a. s/p bioprosthetic AVR 2007 [facility]);  b. Echo (04/2009 - [facility] in [place], [place]):  Mild LVH, normal LVF, anterior WMA, AVR ok (mean 4 mmHg), MAC, mild MR, mild TV stenosis, RVSP 44 mmHg  . Breast cancer     left mastectomy  . Post-menopausal   . Arthritis   . Pacemaker 05/14/2014    dual chamber st jude   Dr Joylene Grapes  . Pneumonia     hx of pna  . Dysrhythmia 04/2014    second degree heart block  . Hx of echocardiogram     Echo (8/15):  Mild focal basal septal hypertrophy, EF 60-65%, no RWMA, AVR ok (mean 8 mmHg), MAC, mild MS (mean 4 mmHg), mod MR, mild LAE, PASP 54 mmHg    Current Outpatient Prescriptions  Medication Sig Dispense Refill  . amLODipine (NORVASC) 10 MG tablet Take 10 mg by mouth Daily.       Marland Kitchen aspirin EC 81 MG tablet Take 81 mg by mouth daily.      Marland Kitchen BENICAR HCT 20-12.5 MG per tablet       . carvedilol (COREG) 6.25 MG tablet Take 6.25 mg by mouth daily.      . clopidogrel (PLAVIX) 75 MG tablet Take 75 mg by mouth daily.      . CRESTOR 20 MG tablet       . Cyanocobalamin (B-12) 1000 MCG CAPS Take 1,000 mcg by mouth daily.       . diphenhydramine-acetaminophen (TYLENOL PM) 25-500 MG TABS Take 0.5 tablets by mouth at bedtime.      . hydrochlorothiazide (HYDRODIURIL) 25 MG tablet Take 12.5 mg by mouth 2 (two) times daily.       Marland Kitchen ibuprofen (ADVIL,MOTRIN) 200 MG tablet Take 400 mg by mouth daily as needed (back pain).      . isosorbide mononitrate (IMDUR) 30 MG 24 hr tablet       . letrozole (FEMARA) 2.5 MG tablet Take 2.5 mg by mouth daily.      Marland Kitchen olmesartan (BENICAR) 20 MG tablet Take 1 tablet (20 mg total) by mouth daily.  30 tablet  11  . Polyethyl Glycol-Propyl Glycol (SYSTANE OP) Place 1 drop into both eyes 2 (two) times daily as needed (dry  eyes).      . vitamin C (ASCORBIC ACID) 500 MG tablet Take 1,000 mg by mouth daily.      . Vitamin D, Ergocalciferol, (DRISDOL) 50000 UNITS CAPS Take 50,000 Units by mouth every 30 (thirty) days. On or about the 1st of each month      . vitamin E 400 UNIT capsule Take 400 Units by mouth daily.       No current facility-administered medications for this visit.    Allergies:   Codeine; Penicillins;  and Vicodin   Social History:  The patient  reports that she quit smoking about 53 years ago. She has never used smokeless tobacco. She reports that she does not drink alcohol or use illicit drugs.   Family History:  The patient's family history includes Arthritis in an other family member; Colon cancer in her sister; Diabetes in an other family member; Heart disease in an other family member.   ROS:  Please see the history of present illness.      All other systems reviewed and negative.   PHYSICAL EXAM: VS:  BP 122/56  Pulse 63  Ht 5\' 2"  (1.575 m)  Wt 121 lb 9.6 oz (55.157 kg)  BMI 22.24 kg/m2  SpO2 98% Well nourished, well developed, in no acute distress HEENT: normal Neck: no JVD Lef upper chest wall, swelling around the PM insertion site, no increase warmth  Cardiac:  normal S1, S2; RRR; 2/6 systolic murmurRUSB Lungs:  clear to auscultation bilaterally, no wheezing, rhonchi or rales Abd: soft, nontender, no hepatomegaly Ext: trace to 1+ bilateral ankle edema Skin: warm and dry Neuro:  CNs 2-12 intact, no focal abnormalities noted  EKG:  Sinus brady, HR 52, LAD, 1st degree AVB, no change from prior tracing    ASSESSMENT AND PLAN:  1. Symptomatic Mobitz II heart block status post pacemaker implantation on 05/13/2014  - Healed insertion site, no sign of infection, functioning well based on the interrogation, resolved symptoms  2. Coronary artery disease:  Asymptomatic, Continue ASA, beta blocker.  She is not on statin Rx for unclear reasons.    3. History of aortic valve  replacement with bioprosthetic valve - Plan: 2D Echocardiogram without contrast  4. History of renal artery stenosis:  Previously followed by [facility]. Normal creatinine and no reason to repeat ultrasound   5. Essential hypertension:  BP ok for her age   51. Hyperlipidemia:On Crestor 20 mg daily.  7. GERD - start Nexium 20 mg po daily  8. Abdominal pain, bloating, constipation - refer to GI for potential colonoscopy  9. Postnasal drip - start Flonase  Follow up in 6 months.    Dorothy Spark 07/24/2014

## 2014-07-24 NOTE — Patient Instructions (Signed)
Your physician has recommended you make the following change in your medication:   START USING FLONASE NASAL SPRAY AS DIRECTED ON THE BOTTLE- YOU CAN OBTAIN THIS AT [place] AS IT IS OTC  START TAKING NEXIUM 20 MG ONCE DAILY    You have been referred to DR [facility] GASTROENTEROLOGIST    Your physician wants you to follow-up in: [place] will receive a reminder letter in the mail two months in advance. If you don't receive a letter, please call our office to schedule the follow-up appointment.

## 2014-07-25 ENCOUNTER — Encounter: Payer: Self-pay | Admitting: Physician Assistant

## 2014-07-31 ENCOUNTER — Ambulatory Visit (INDEPENDENT_AMBULATORY_CARE_PROVIDER_SITE_OTHER): Payer: Medicare Other | Admitting: Family Medicine

## 2014-07-31 ENCOUNTER — Ambulatory Visit (INDEPENDENT_AMBULATORY_CARE_PROVIDER_SITE_OTHER): Payer: Medicare Other

## 2014-07-31 VITALS — BP 136/84 | HR 62 | Temp 97.4°F | Resp 16 | Ht 62.0 in | Wt 120.2 lb

## 2014-07-31 DIAGNOSIS — S8991XA Unspecified injury of right lower leg, initial encounter: Secondary | ICD-10-CM

## 2014-07-31 DIAGNOSIS — I2510 Atherosclerotic heart disease of native coronary artery without angina pectoris: Secondary | ICD-10-CM

## 2014-07-31 DIAGNOSIS — S0011XA Contusion of right eyelid and periocular area, initial encounter: Secondary | ICD-10-CM

## 2014-07-31 NOTE — Progress Notes (Signed)
[facility] [address], [place] [address] [phone]  Date:  07/31/2014   Name:  Stephanie Cochran   DOB:  04-25-1918   MRN:  [id number]  PCP:  Cari Caraway, MD    Chief Complaint: Knee Injury   History of Present Illness:  Stephanie Cochran is a 80 y.o. very pleasant female patient who presents with the following:  She lost her balance and fell 2 days ago. She went down onto her right knee, and also bumped her head.  She has a black eye and her right knee is bruised.  However she does not note any headache or vision change She does have a walker at home but is using her cane right now.   She is on plavix for history of CAD and a fib.   Here today with a caregiver.    Patient Active Problem List   Diagnosis Date Noted  . Heart block AV second degree 05/13/2014  . AV block, 2nd degree 05/13/2014  . Atherosclerosis of renal artery 01/04/2012  . Lower extremity edema 11/11/2011  . Coronary artery disease 11/11/2011  . History of aortic valve replacement with bioprosthetic valve 11/11/2011  . History of renal artery stenosis 11/11/2011  . Hypertension 11/11/2011  . Other and unspecified hyperlipidemia 11/11/2011    Past Medical History  Diagnosis Date  . CAD (coronary artery disease)     s/p CABG 2007 at [facility]  . HTN (hypertension)   . PVD (peripheral vascular disease)   . Diverticulosis     history  . HLD (hyperlipidemia)   . Atrial fibrillation     post operative  . Atrial flutter     post operative  . Cellulitis     left forearm  . Aortic stenosis     a. s/p bioprosthetic AVR 2007 [facility]  b. Echo (04/2009 - [facility] in [place], [place]):  Mild LVH, normal LVF, anterior WMA, AVR ok (mean 4 mmHg), MAC, mild MR, mild TV stenosis, RVSP 44 mmHg  . Breast cancer     left mastectomy  . Post-menopausal   . Arthritis   . Pacemaker 05/14/2014    dual chamber [facility] jude   Dr Joylene Grapes  . Pneumonia     hx of pna  . Dysrhythmia 04/2014    second degree  heart block  . Hx of echocardiogram     Echo (8/15):  Mild focal basal septal hypertrophy, EF 60-65%, no RWMA, AVR ok (mean 8 mmHg), MAC, mild MS (mean 4 mmHg), mod MR, mild LAE, PASP 54 mmHg    Past Surgical History  Procedure Laterality Date  . Renal artery stent  2006, 2010  . Aortic valve replacement  2007    with 68mm [facility] pericardial tissue valve   . Coronary artery bypass graft Left 2007    x3, left internal mammary artery to the left anterior descending, saphenous vein graft tot he circumflex coronary  . Arteriovenous graft placement w/ endoscopic vein harvest Left     left leg  . Mastectomy Left 2008    left, breast cancer, followed by a hematology oncologist  . Tonsillectomy  1943  . Hemorroidectomy    . Breast surgery    . Eye surgery    . Cardiac valve replacement    . Ep implantable device  05/14/2014    st jude dual chamber    History  Substance Use Topics  . Smoking status: Former Smoker    Quit date:  09/27/1960  . Smokeless tobacco: Never Used  . Alcohol Use: No    Family History  Problem Relation Age of Onset  . Colon cancer Sister   . Diabetes    . Heart disease    . Arthritis      Allergies  Allergen Reactions  . Codeine Nausea And Vomiting  . Penicillins Nausea And Vomiting  . Vicodin [Hydrocodone-Acetaminophen] Nausea And Vomiting    Medication list has been reviewed and updated.  Current Outpatient Prescriptions on File Prior to Visit  Medication Sig Dispense Refill  . amLODipine (NORVASC) 10 MG tablet Take 10 mg by mouth Daily.     Marland Kitchen aspirin EC 81 MG tablet Take 81 mg by mouth daily.    Marland Kitchen BENICAR HCT 20-12.5 MG per tablet     . carvedilol (COREG) 6.25 MG tablet Take 6.25 mg by mouth daily.    . clopidogrel (PLAVIX) 75 MG tablet Take 75 mg by mouth daily.    . CRESTOR 20 MG tablet     . Cyanocobalamin (B-12) 1000 MCG CAPS Take 1,000 mcg by mouth daily.     . diphenhydramine-acetaminophen (TYLENOL PM) 25-500 MG TABS Take 0.5  tablets by mouth at bedtime.    Marland Kitchen esomeprazole (NEXIUM) 20 MG packet Take 20 mg by mouth daily before breakfast. 30 each 12  . fluticasone (FLONASE) 50 MCG/ACT nasal spray Place 1 spray into both nostrils daily.  2  . hydrochlorothiazide (HYDRODIURIL) 25 MG tablet Take 12.5 mg by mouth 2 (two) times daily.     Marland Kitchen ibuprofen (ADVIL,MOTRIN) 200 MG tablet Take 400 mg by mouth daily as needed (back pain).    . isosorbide mononitrate (IMDUR) 30 MG 24 hr tablet     . letrozole (FEMARA) 2.5 MG tablet Take 2.5 mg by mouth daily.    Marland Kitchen olmesartan (BENICAR) 20 MG tablet Take 1 tablet (20 mg total) by mouth daily. 30 tablet 11  . Polyethyl Glycol-Propyl Glycol (SYSTANE OP) Place 1 drop into both eyes 2 (two) times daily as needed (dry eyes).    . vitamin C (ASCORBIC ACID) 500 MG tablet Take 1,000 mg by mouth daily.    . Vitamin D, Ergocalciferol, (DRISDOL) 50000 UNITS CAPS Take 50,000 Units by mouth every 30 (thirty) days. On or about the 1st of each month    . vitamin E 400 UNIT capsule Take 400 Units by mouth daily.     No current facility-administered medications on file prior to visit.    Review of Systems:  As per HPI- otherwise negative.   Physical Examination: Filed Vitals:   07/31/14 1138  BP: 136/84  Pulse: 62  Temp: 97.4 F (36.3 C)  Resp: 16   Filed Vitals:   07/31/14 1138  Height: 5\' 2"  (1.575 m)  Weight: 120 lb 3.2 oz (54.522 kg)   Body mass index is 21.98 kg/(m^2). Ideal Body Weight: Weight in (lb) to have BMI = 25: 136.4  GEN: WDWN, NAD, Non-toxic, A & O x 3, elderly lady who appears well HEENT: Atraumatic, Normocephalic. Neck supple. No masses, No LAD.  Bilateral TM wnl, oropharynx normal.  PEERL,EOMI.   She has a black eye on the right with mild tenderness of the superior orbital bone.  Normal fundoscopic exam  Ears and Nose: No external deformity. CV: RRR, No M/G/R. No JVD. No thrill. No extra heart sounds. PULM: CTA B, no wheezes, crackles, rhonchi. No retractions. No  resp. distress. No accessory muscle use. ABD: S, NT, ND EXTR: No c/c/e  NEURO came in with a cane, able to walk slowly PSYCH: Normally interactive. Conversant. Not depressed or anxious appearing.  Calm demeanor.  Right knee: there is diffuse bruising and tenderness of the proximal tibia, and some swelling of the knee.  No apparent effusion.  Tested ROM after x-ray did not reveal a definite fracture and she is able to flex and extend joint without much pain.    [facility] reading (PRIMARY) by  Dr. Lorelei Pont. Right knee: severe degenerative change but no fracture noted.   Orbits: no fracture seen  EXAM: ORBITS - COMPLETE 4+ VIEW  COMPARISON: None.  FINDINGS: Orbital rims are intact. No clear fluid in the maxillary sinuses.  IMPRESSION: No radiographic evidence of facial fracture.  RIGHT KNEE - COMPLETE 4+ VIEW  COMPARISON: 04/30/2011, and earlier.  FINDINGS: Chronic tricompartmental degenerative changes have progressed since 2012. Patella appears intact. No acute fracture or dislocation identified. Medial superficial soft tissue swelling and stranding at the joint space level. Bulky calcified right lower extremity atherosclerosis again noted.  IMPRESSION: Severe, progressed degenerative changes since 2012. Soft tissue injury but no No acute fracture or dislocation identified about the right knee.   Assessment and Plan: Injury of right knee, initial encounter - Plan: DG Knee Complete 4 Views Right  Black eye, right, initial encounter - Plan: DG Orbits  Recommended a CT of the right knee to rule- out occult fracture and also consideration of a CT of her head to rule- out any more severe injury.  She declines both of these tests at this time but assures me that she will let us know if not better soon Placed in a knee immobilizer to use as needed, she will ice and elevate.  She has a walker at home to use as needed Signed Lamar Blinks, MD

## 2014-07-31 NOTE — Patient Instructions (Signed)
Use your walker as needed and wear the knee immobilizer to protect your knee.  If you change your mind about a CT scan of your knee and/ or your head just give me a call. If you have significant headache or dizziness seek care right away. If your knee does not feel better in the next week or so a fracture is more likely- then I would definitely recommend a CT scan.

## 2014-08-01 ENCOUNTER — Ambulatory Visit: Payer: Medicare Other | Admitting: Physician Assistant

## 2014-08-05 ENCOUNTER — Telehealth: Payer: Self-pay

## 2014-08-05 DIAGNOSIS — M25561 Pain in right knee: Secondary | ICD-10-CM

## 2014-08-05 NOTE — Telephone Encounter (Signed)
Pt is needing to talk with someone about getting a scan done on her leg as soon as possible hopefully to day since she is going out of town on Thursday   Best number [phone]

## 2014-08-05 NOTE — Telephone Encounter (Signed)
Dr. Lorelei Pont patient request referral for MRI on her head and knee, also would like medical records and x-ray.   Her call back number is [phone]

## 2014-08-06 ENCOUNTER — Ambulatory Visit
Admission: RE | Admit: 2014-08-06 | Discharge: 2014-08-06 | Disposition: A | Discharge status: Home / Self Care | Payer: Medicare Other | Source: Ambulatory Visit | Attending: Family Medicine | Admitting: Family Medicine

## 2014-08-06 DIAGNOSIS — M25561 Pain in right knee: Secondary | ICD-10-CM

## 2014-08-06 NOTE — Telephone Encounter (Signed)
Called and spoke with her.  She is still having pain in her right knee and would like to have a CT scan to r/o occult fracture.  She is not having any headaches, nausea, vomiting or other sx of intracranial injury and does not actually want a CT of her head  Will schedule CT of the right knee without contrast asap

## 2014-08-07 ENCOUNTER — Telehealth: Payer: Self-pay | Admitting: *Deleted

## 2014-08-07 ENCOUNTER — Other Ambulatory Visit: Payer: Medicare Other

## 2014-08-07 ENCOUNTER — Encounter: Payer: Self-pay | Admitting: Family Medicine

## 2014-08-07 NOTE — Telephone Encounter (Signed)
Pt would like to know CT results.  Can you please review  [phone] Stephanie Cochran

## 2014-08-07 NOTE — Telephone Encounter (Signed)
Called back and LMOM . No fracture but she does have advanced degenerative change.  Likely surgery not a good option due to her age.  Knee brace may help her walk more easily  Results as below CT OF THE RIGHT KNEE WITHOUT CONTRAST  TECHNIQUE: Multidetector CT imaging of the right knee was performed according to the standard protocol. Multiplanar CT image reconstructions were also generated.  COMPARISON: Radiographs 07/31/2014 and 04/30/2011.  FINDINGS: The bones appear mildly demineralized. There is no evidence of acute fracture or dislocation. There are advanced tricompartmental degenerative changes, most advanced within the lateral and patellofemoral compartments. There are large osteophytes as well as 1 small loose body within the popliteus hiatus.  There is a moderate size knee joint effusion and a small Baker's cyst. Oval shaped increased density within the prepatellar fat most likely represents a posttraumatic hematoma. The extensor mechanism is intact.  The lateral meniscus is likely torn. The anterior cruciate ligament is poorly visualized, likely deficient. Extensive vascular calcifications are again noted.  IMPRESSION: 1. No evidence of acute fracture or dislocation. 2. Advanced tricompartmental degenerative changes with joint effusion and small loose body. 3. Prepatellar subcutaneous hematoma.

## 2014-08-29 ENCOUNTER — Ambulatory Visit
Admission: RE | Admit: 2014-08-29 | Discharge: 2014-08-29 | Disposition: A | Discharge status: Home / Self Care | Payer: Medicare Other | Source: Ambulatory Visit | Attending: Family Medicine | Admitting: Family Medicine

## 2014-08-29 ENCOUNTER — Other Ambulatory Visit: Payer: Self-pay | Admitting: Family Medicine

## 2014-08-29 DIAGNOSIS — R059 Cough, unspecified: Secondary | ICD-10-CM

## 2014-08-29 DIAGNOSIS — R05 Cough: Secondary | ICD-10-CM

## 2014-08-29 DIAGNOSIS — R0989 Other specified symptoms and signs involving the circulatory and respiratory systems: Secondary | ICD-10-CM

## 2014-08-30 ENCOUNTER — Encounter: Payer: Self-pay | Admitting: Internal Medicine

## 2014-08-30 ENCOUNTER — Ambulatory Visit (INDEPENDENT_AMBULATORY_CARE_PROVIDER_SITE_OTHER): Payer: Medicare Other | Admitting: Internal Medicine

## 2014-08-30 VITALS — BP 130/56 | HR 62 | Ht 62.5 in | Wt 120.0 lb

## 2014-08-30 DIAGNOSIS — I10 Essential (primary) hypertension: Secondary | ICD-10-CM

## 2014-08-30 DIAGNOSIS — I2510 Atherosclerotic heart disease of native coronary artery without angina pectoris: Secondary | ICD-10-CM

## 2014-08-30 DIAGNOSIS — I442 Atrioventricular block, complete: Secondary | ICD-10-CM

## 2014-08-30 NOTE — Patient Instructions (Signed)
Your physician wants you to follow-up in: 12 months with Dr. Rayann Heman. You will receive a reminder letter in the mail two months in advance. If you don't receive a letter, please call our office to schedule the follow-up appointment.   Remote monitoring is used to monitor your Pacemaker of ICD from home. This monitoring reduces the number of office visits required to check your device to one time per year. It allows [place] to keep an eye on the functioning of your device to ensure it is working properly. You are scheduled for a device check from home on 12/02/14. You may send your transmission at any time that day. If you have a wireless device, the transmission will be sent automatically. After your physician reviews your transmission, you will receive a postcard with your next transmission date.

## 2014-08-30 NOTE — Progress Notes (Signed)
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  Since her recent PPM implant, the patient reports doing very well.  Her SOB is much improved. Today, she denies symptoms of palpitations, chest pain, lower extremity edema, dizziness, presyncope, or syncope.  The patient is otherwise without complaint today.   Past Medical History  Diagnosis Date  . CAD (coronary artery disease)     s/p CABG 2007 at [facility]  . HTN (hypertension)   . PVD (peripheral vascular disease)   . Diverticulosis     history  . HLD (hyperlipidemia)   . Atrial fibrillation     post operative  . Atrial flutter     post operative  . Cellulitis     left forearm  . Aortic stenosis     a. s/p bioprosthetic AVR 2007 [facility]);  b. Echo (04/2009 - [facility] in [place], [place]):  Mild LVH, normal LVF, anterior WMA, AVR ok (mean 4 mmHg), MAC, mild MR, mild TV stenosis, RVSP 44 mmHg  . Breast cancer     left mastectomy  . Post-menopausal   . Arthritis   . Pacemaker 05/14/2014    dual chamber st jude   Dr Joylene Grapes  . Pneumonia     hx of pna  . Dysrhythmia 04/2014    second degree heart block  . Hx of echocardiogram     Echo (8/15):  Mild focal basal septal hypertrophy, EF 60-65%, no RWMA, AVR ok (mean 8 mmHg), MAC, mild MS (mean 4 mmHg), mod MR, mild LAE, PASP 54 mmHg   Past Surgical History  Procedure Laterality Date  . Renal artery stent  2006, 2010  . Aortic valve replacement  2007    with 60mm [facility] pericardial tissue valve   . Coronary artery bypass graft Left 2007    x3, left internal mammary artery to the left anterior descending, saphenous vein graft tot he circumflex coronary  . Arteriovenous graft placement w/ endoscopic vein harvest Left     left leg  . Mastectomy Left 2008    left, breast cancer, followed by a hematology oncologist  . Tonsillectomy  1943  . Hemorroidectomy    . Breast surgery    .  Eye surgery    . Cardiac valve replacement    . Pacemaker insertion  05/14/2014    SJM Assurity DR implanted by Dr Rayann Heman for complete heart block    ROS- all systems are reviewed and negative except as per HPI above  Current Outpatient Prescriptions  Medication Sig Dispense Refill  . amLODipine (NORVASC) 10 MG tablet Take 10 mg by mouth Daily.     Marland Kitchen aspirin EC 81 MG tablet Take 81 mg by mouth daily.    Marland Kitchen azithromycin (ZITHROMAX) 250 MG tablet Take 1 tablet by mouth daily.    Marland Kitchen BENICAR HCT 20-12.5 MG per tablet Take 1 tablet by mouth daily.     . carvedilol (COREG) 6.25 MG tablet Take 6.25 mg by mouth daily.    . clopidogrel (PLAVIX) 75 MG tablet Take 75 mg by mouth daily.    . CRESTOR 20 MG tablet Take 20 mg by mouth daily.     . Cyanocobalamin (B-12) 1000 MCG CAPS Take 1,000 mcg by mouth daily.     . diphenhydramine-acetaminophen (TYLENOL PM) 25-500 MG TABS Take 0.5 tablets by mouth at bedtime.    Marland Kitchen esomeprazole (NEXIUM) 20 MG packet Take 20 mg  by mouth daily before breakfast. 30 each 12  . fluticasone (FLONASE) 50 MCG/ACT nasal spray Place 1 spray into both nostrils daily.  2  . hydrochlorothiazide (HYDRODIURIL) 25 MG tablet Take 12.5 mg by mouth 2 (two) times daily.     Marland Kitchen ibuprofen (ADVIL,MOTRIN) 200 MG tablet Take 400 mg by mouth daily as needed (back pain).    Marland Kitchen letrozole (FEMARA) 2.5 MG tablet Take 2.5 mg by mouth daily.    Vladimir Faster Glycol-Propyl Glycol (SYSTANE OP) Place 1 drop into both eyes 2 (two) times daily as needed (dry eyes).    . vitamin C (ASCORBIC ACID) 500 MG tablet Take 1,000 mg by mouth daily.    . Vitamin D, Ergocalciferol, (DRISDOL) 50000 UNITS CAPS Take 50,000 Units by mouth every 30 (thirty) days. On or about the 1st of each month    . vitamin E 400 UNIT capsule Take 400 Units by mouth daily.     No current facility-administered medications for this visit.    Physical Exam: Filed Vitals:   08/30/14 1126  BP: 130/56  Pulse: 62  Height: 5' 2.5" (1.588 m)   Weight: 120 lb (54.432 kg)    GEN- The patient is well appearing, alert and oriented x 3 today.   Head- normocephalic, atraumatic Eyes-  Sclera clear, conjunctiva pink Ears- hearing intact Oropharynx- clear Lungs- Clear to ausculation bilaterally, normal work of breathing Chest- pacemaker pocket is well healed Heart- Regular rate and rhythm, no murmurs, rubs or gallops, PMI not laterally displaced GI- soft, NT, ND, + BS Extremities- no clubbing, cyanosis, or edema  Pacemaker interrogation- reviewed in detail today,  See PACEART report ekg today reveals AV sequential pacing  Assessment and Plan:  1. Complete heart block Normal pacemaker function See Pace Art report No changes today  2. HTN Stable No change required today  3. CAD No ischemic symptoms No changes today  [name] Return to see me in 1 year

## 2014-09-04 LAB — MDC_IDC_ENUM_SESS_TYPE_INCLINIC
Battery Remaining Longevity: 110.4 mo
Brady Statistic RA Percent Paced: 88 %
Brady Statistic RV Percent Paced: 98 %
Date Time Interrogation Session: 20151204162553
Implantable Pulse Generator Serial Number: 7663995
Lead Channel Impedance Value: 412.5 Ohm
Lead Channel Impedance Value: 587.5 Ohm
Lead Channel Pacing Threshold Amplitude: 0.75 V
Lead Channel Pacing Threshold Pulse Width: 0.5 ms
Lead Channel Pacing Threshold Pulse Width: 0.5 ms
Lead Channel Setting Pacing Amplitude: 1 V
Lead Channel Setting Pacing Amplitude: 2 V
Lead Channel Setting Sensing Sensitivity: 3 mV
MDC IDC MSMT BATTERY VOLTAGE: 3.01 V
MDC IDC MSMT LEADCHNL RA PACING THRESHOLD AMPLITUDE: 1 V
MDC IDC MSMT LEADCHNL RA SENSING INTR AMPL: 1.7 mV
MDC IDC SET LEADCHNL RV PACING PULSEWIDTH: 0.5 ms

## 2014-09-05 ENCOUNTER — Encounter (HOSPITAL_COMMUNITY): Payer: Self-pay | Admitting: Internal Medicine

## 2014-10-02 ENCOUNTER — Other Ambulatory Visit: Payer: Self-pay | Admitting: Physician Assistant

## 2014-10-03 ENCOUNTER — Other Ambulatory Visit: Payer: Self-pay | Admitting: *Deleted

## 2014-10-03 MED ORDER — AMLODIPINE BESYLATE 10 MG PO TABS: 10.0000 mg | ORAL_TABLET | Freq: Every day | ORAL | Status: DC

## 2014-10-03 MED ORDER — VITAMIN D (ERGOCALCIFEROL) 1.25 MG (50000 UNIT) PO CAPS: 50000.0000 [IU] | ORAL_CAPSULE | ORAL | Status: DC

## 2014-10-03 MED ORDER — CLOPIDOGREL BISULFATE 75 MG PO TABS: 75.0000 mg | ORAL_TABLET | Freq: Every day | ORAL | Status: DC

## 2014-10-03 MED ORDER — OLMESARTAN MEDOXOMIL-HCTZ 20-12.5 MG PO TABS: 1.0000 | ORAL_TABLET | Freq: Every day | ORAL | Status: DC

## 2014-10-03 MED ORDER — LETROZOLE 2.5 MG PO TABS: 2.5000 mg | ORAL_TABLET | Freq: Every day | ORAL | Status: DC

## 2014-10-03 MED ORDER — CARVEDILOL 6.25 MG PO TABS: 6.2500 mg | ORAL_TABLET | Freq: Every day | ORAL | Status: DC

## 2014-10-03 MED ORDER — ROSUVASTATIN CALCIUM 20 MG PO TABS: 20.0000 mg | ORAL_TABLET | Freq: Every day | ORAL | Status: DC

## 2014-12-02 ENCOUNTER — Ambulatory Visit (INDEPENDENT_AMBULATORY_CARE_PROVIDER_SITE_OTHER): Payer: Medicare Other | Admitting: *Deleted

## 2014-12-02 DIAGNOSIS — I442 Atrioventricular block, complete: Secondary | ICD-10-CM

## 2014-12-02 NOTE — Progress Notes (Signed)
Remote pacemaker transmission.   

## 2014-12-03 LAB — MDC_IDC_ENUM_SESS_TYPE_REMOTE
Battery Remaining Longevity: 110 mo
Brady Statistic AP VS Percent: 1 %
Brady Statistic AS VS Percent: 1 %
Brady Statistic RA Percent Paced: 85 %
Brady Statistic RV Percent Paced: 99 %
Date Time Interrogation Session: 20160307073949
Implantable Pulse Generator Model: 2240
Implantable Pulse Generator Serial Number: 7663995
Lead Channel Impedance Value: 390 Ohm
Lead Channel Pacing Threshold Amplitude: 1 V
Lead Channel Pacing Threshold Pulse Width: 0.5 ms
Lead Channel Sensing Intrinsic Amplitude: 1.7 mV
Lead Channel Setting Pacing Amplitude: 1.125
Lead Channel Setting Pacing Amplitude: 2 V
Lead Channel Setting Pacing Pulse Width: 0.5 ms
Lead Channel Setting Sensing Sensitivity: 3 mV
MDC IDC MSMT BATTERY REMAINING PERCENTAGE: 95.5 %
MDC IDC MSMT BATTERY VOLTAGE: 3.02 V
MDC IDC MSMT LEADCHNL RA PACING THRESHOLD PULSEWIDTH: 0.5 ms
MDC IDC MSMT LEADCHNL RV IMPEDANCE VALUE: 600 Ohm
MDC IDC MSMT LEADCHNL RV PACING THRESHOLD AMPLITUDE: 0.875 V
MDC IDC MSMT LEADCHNL RV SENSING INTR AMPL: 3.6 mV
MDC IDC STAT BRADY AP VP PERCENT: 85 %
MDC IDC STAT BRADY AS VP PERCENT: 15 %

## 2014-12-09 ENCOUNTER — Encounter: Payer: Self-pay | Admitting: Cardiology

## 2014-12-13 ENCOUNTER — Encounter: Payer: Self-pay | Admitting: Internal Medicine

## 2015-01-20 ENCOUNTER — Encounter: Payer: Self-pay | Admitting: *Deleted

## 2015-01-22 ENCOUNTER — Ambulatory Visit (INDEPENDENT_AMBULATORY_CARE_PROVIDER_SITE_OTHER): Payer: Medicare Other | Admitting: Cardiology

## 2015-01-22 ENCOUNTER — Encounter: Payer: Self-pay | Admitting: Cardiology

## 2015-01-22 VITALS — BP 110/56 | HR 62 | Ht 62.5 in | Wt 124.0 lb

## 2015-01-22 DIAGNOSIS — Z950 Presence of cardiac pacemaker: Secondary | ICD-10-CM

## 2015-01-22 DIAGNOSIS — E785 Hyperlipidemia, unspecified: Secondary | ICD-10-CM

## 2015-01-22 DIAGNOSIS — I5031 Acute diastolic (congestive) heart failure: Secondary | ICD-10-CM

## 2015-01-22 DIAGNOSIS — I10 Essential (primary) hypertension: Secondary | ICD-10-CM | POA: Diagnosis not present

## 2015-01-22 DIAGNOSIS — I441 Atrioventricular block, second degree: Secondary | ICD-10-CM

## 2015-01-22 DIAGNOSIS — I2510 Atherosclerotic heart disease of native coronary artery without angina pectoris: Secondary | ICD-10-CM

## 2015-01-22 MED ORDER — CARVEDILOL 6.25 MG PO TABS: 6.2500 mg | ORAL_TABLET | Freq: Every day | ORAL | Status: DC

## 2015-01-22 MED ORDER — FUROSEMIDE 20 MG PO TABS: 20.0000 mg | ORAL_TABLET | Freq: Every day | ORAL | Status: DC

## 2015-01-22 MED ORDER — ROSUVASTATIN CALCIUM 5 MG PO TABS: 5.0000 mg | ORAL_TABLET | Freq: Every day | ORAL | Status: DC

## 2015-01-22 MED ORDER — CLOPIDOGREL BISULFATE 75 MG PO TABS: 75.0000 mg | ORAL_TABLET | Freq: Every day | ORAL | Status: DC

## 2015-01-22 NOTE — Patient Instructions (Signed)
Medication Instructions:   Your physician has recommended you make the following change in your medication:    STOP TAKING HCTZ  STOP IMDUR  DECREASE YOUR CRESTOR TO 5 MG ONCE DAILY  START LASIX 20 MG ONCE DAILY    Labwork:  Your physician recommends that you return for lab work in:  [place] AS YOUR 2 MONTH FOLLOW-UP APPOINTMENT---BMET       Follow-Up:  Your physician recommends that you schedule a follow-up appointment in: 2 [place]

## 2015-01-22 NOTE — Progress Notes (Signed)
Patient ID: Stephanie Cochran, female   DOB: May 20, 1918, 81 y.o.   MRN: [id number]    Cardiology Office Note  Date:  01/22/2015   ID:  Stephanie Cochran, DOB Feb 02, 1918, MRN [id number]  PCP:  Cari Caraway, MD  Cardiologist:  Dr. Jenell Milliner >>> Dr. Ena Dawley     History of Present Illness:  Primary Discharge Diagnosis:  Symptomatic Mobitz II heart block status post pacemaker implantation this admission   Secondary Discharge Diagnosis:  1. CAD s/p CABG  2. Renal artery stenosis s/p stenting  3. Hyperlipidemia  4. Prior AVR  5. Breast cancer s/p left mastectomy  Procedures This Admission:  1. Implantation of a dual chamber pacemaker on 05-14-14 by Dr Rayann Heman. The patient received a STJ Assurity dual chamber pacemaker with model number 2088 right atrial lead and model number [id number] right ventricular lead. There were no early immediate complications.  2. CXR on 05-15-2014 demonstrated stable lead position, no obvious ptx   Chief complain: Dyspnea on exertion  Brief HPI/Hospital Course:   Stephanie Cochran is a 81 y.o. female with a past medical history significant for CAD (s/p CABG), renal artery stenosis (s/p stenting), hyperlipidemia, atrial fibrillation (post CABG), prior AVR, and breast cancer (s/p left mastectomy). She has had progressive dyspnea on exertion over the past month. Plans were for outpatient echo and myoview. When the patient presented for [facility], she was found to be in 2:1 heart block and was referred to [facility] for further evaluation.  She is on Carvedilol 6.25 twice daily at home with last dose 05-12-14 afternoon. AV block persists intermittently despite washout of this medicine.  She has had at least 2 falls in the last month. She denies frank syncope, but does report dizziness prior to falling. She is still very active and travelled to [place] recently with her family. She denies chest pain, lower extremity edema, recent fevers, chills, nausea or vomiting. ROS is otherwise  negative.  Echo 05-13-14 demonstrated EF 60-65%, no RWMA, mild mitral stenosis, moderate MR, LA 45.  She was evaluated by Dr Rayann Heman with EP who recommended pacemaker implantation. Risks, benefits, and alternatives were reviewed with the patient who wished to proceed. The patient underwent implantation of a STJ dual chamber pacemaker with details as outlined above. She was monitored on telemetry overnight which demonstrated AV pacing. Left chest was without hematoma or ecchymosis. The device was interrogated and found to be functioning normally. CXR was obtained and demonstrated no pneumothorax status post device implantation. Wound care, arm mobility, and restrictions were reviewed with the patient. Dr Rayann Heman examined the patient and considered them stable for discharge to home.  She was noted to be anemic on lab work. No melena, other obvious bleeding. Pt advised to follow up with PCP regarding anemia.   01/22/2015 - the patient is coming after 6 months she feels very well has no palpitations or syncope her pacemaker site healed very well and she doesn't have any pain at the implantation site. She has been checked by EP clinic and her pacemaker is working appropriately. She has also obtained wireless report and her pacemaker is working real well. At the last visit we have referred her for cardiac rehabilitation however it was too far and she is able to get there. She is asking about home physical therapy. She denies any chest pain, but has complained of dyspnea on exertion.. She has been experiencing problem with her balance and fell multiple times in the recent times and hit her knee.  Recent Labs:  Wt Readings from Last 3 Encounters:  01/22/15 124 lb (56.246 kg)  08/30/14 120 lb (54.432 kg)  07/31/14 120 lb 3.2 oz (54.522 kg)     Past Medical History  Diagnosis Date  . CAD (coronary artery disease)     s/p CABG 2007 at [facility]  . HTN (hypertension)   . PVD (peripheral vascular disease)   .  Diverticulosis     history  . HLD (hyperlipidemia)   . Atrial fibrillation     post operative  . Atrial flutter     post operative  . Cellulitis     left forearm  . Aortic stenosis     a. s/p bioprosthetic AVR 2007 [facility]);  b. Echo (04/2009 - [facility] in [place], [place]):  Mild LVH, normal LVF, anterior WMA, AVR ok (mean 4 mmHg), MAC, mild MR, mild TV stenosis, RVSP 44 mmHg  . Breast cancer     left mastectomy  . Post-menopausal   . Arthritis   . Pacemaker 05/14/2014    dual chamber st jude   Dr Joylene Grapes  . Pneumonia     hx of pna  . Dysrhythmia 04/2014    second degree heart block  . Hx of echocardiogram     Echo (8/15):  Mild focal basal septal hypertrophy, EF 60-65%, no RWMA, AVR ok (mean 8 mmHg), MAC, mild MS (mean 4 mmHg), mod MR, mild LAE, PASP 54 mmHg    Current Outpatient Prescriptions  Medication Sig Dispense Refill  . amLODipine (NORVASC) 10 MG tablet Take 1 tablet (10 mg total) by mouth daily. 90 tablet 1  . aspirin EC 81 MG tablet Take 81 mg by mouth daily.    . carvedilol (COREG) 6.25 MG tablet Take 1 tablet (6.25 mg total) by mouth daily. 90 tablet 1  . clopidogrel (PLAVIX) 75 MG tablet Take 1 tablet (75 mg total) by mouth daily. 90 tablet 1  . Cyanocobalamin (B-12) 1000 MCG CAPS Take 1,000 mcg by mouth daily.     . diphenhydramine-acetaminophen (TYLENOL PM) 25-500 MG TABS Take 0.5 tablets by mouth at bedtime as needed.     Marland Kitchen esomeprazole (NEXIUM) 20 MG packet Take 20 mg by mouth daily before breakfast. 30 each 12  . hydrochlorothiazide (HYDRODIURIL) 25 MG tablet Take 12.5 mg by mouth 2 (two) times daily.     Marland Kitchen ibuprofen (ADVIL,MOTRIN) 200 MG tablet Take 400 mg by mouth daily as needed (back pain).    . isosorbide mononitrate (IMDUR) 30 MG 24 hr tablet TAKE 1/2 A TABLET BY MOUTH EVERY DAY 15 tablet 0  . letrozole (FEMARA) 2.5 MG tablet Take 1 tablet (2.5 mg total) by mouth daily. 90 tablet 1  . olmesartan-hydrochlorothiazide (BENICAR HCT) 20-12.5 MG per tablet Take 1  tablet by mouth daily. 90 tablet 1  . Polyethyl Glycol-Propyl Glycol (SYSTANE OP) Place 1 drop into both eyes 2 (two) times daily as needed (dry eyes).    . rosuvastatin (CRESTOR) 20 MG tablet Take 1 tablet (20 mg total) by mouth daily. 90 tablet 1  . vitamin C (ASCORBIC ACID) 500 MG tablet Take 1,000 mg by mouth daily.    . Vitamin D, Ergocalciferol, (DRISDOL) 50000 UNITS CAPS capsule Take 1 capsule (50,000 Units total) by mouth every 30 (thirty) days. On or about the 1st of each month 6 capsule 0  . vitamin E 400 UNIT capsule Take 400 Units by mouth daily.     No current facility-administered medications for this visit.  Allergies:   Codeine; Penicillins; and Vicodin   Social History:  The patient  reports that she quit smoking about 54 years ago. She has never used smokeless tobacco. She reports that she does not drink alcohol or use illicit drugs.   Family History:  The patient's family history includes Arthritis in an other family member; Colon cancer in her sister; Diabetes in an other family member; Heart disease in an other family member.   ROS:  Please see the history of present illness.      All other systems reviewed and negative.   PHYSICAL EXAM: VS:  BP 110/56 mmHg  Pulse 62  Ht 5' 2.5" (1.588 m)  Wt 124 lb (56.246 kg)  BMI 22.30 kg/m2  SpO2 97% Well nourished, well developed, in no acute distress HEENT: normal Neck: no JVD Lef upper chest wall, swelling around the PM insertion site, no increase warmth  Cardiac:  normal S1, S2; RRR; 2/6 systolic murmurRUSB Lungs:  clear to auscultation bilaterally, no wheezing, rhonchi or rales Abd: soft, nontender, no hepatomegaly Ext: trace to 1+ bilateral ankle edema Skin: warm and dry Neuro:  CNs 2-12 intact, no focal abnormalities noted  Echocardiogram: August 2015 Left ventricle: E/e&'>16.2 suggestive of elevated LV filling pressure. The cavity size was normal. There was mild focal basal hypertrophy of the septum.  Systolic function was normal. The estimated ejection fraction was in the range of 60% to 65%. Wall motion was normal; there were no regional wall motion abnormalities. - Aortic valve: A bioprosthesis was present. - Mitral valve: Severely calcified annulus. Mildly thickened leaflets . Mild thickening and calcification, with mild involvement of chords. The findings are consistent with mild stenosis. There was moderate regurgitation. - Left atrium: The atrium was mildly dilated. - Pulmonary arteries: PA peak pressure: 54 mm Hg (S).  EKG:  Sinus brady, HR 52, LAD, 1st degree AVB, no change from prior tracing      ASSESSMENT AND PLAN:  1. Symptomatic Mobitz II heart block status post pacemaker implantation on 05/13/2014 - Healed insertion site, no sign of infection, functioning well based on the interrogation, resolved symptoms, normal function of the pacemaker as per report in March 2016.  2. Coronary artery disease:  Asymptomatic, Continue ASA, beta blocker.  We will decrease the dose of statin from 20 mg to only 5 mg of Crestor daily.  3. Balance problems and falls - I'm concerned that these might be related to relatively low blood pressure considering her age. I will discontinue hydrochlorothiazide, Imdur.    4. Acute diastolic CHF - we'll start Lasix 20 mg daily, her last echocardiogram last year showed grade 2 diastolic dysfunction with elevated filling pressure and moderate pulmonary hypertension. She is already on amlodipine 10 mg daily.  5. History of aortic valve replacement with bioprosthetic valve - Plan: 2D Echocardiogram without contrast  6. History of renal artery stenosis:  Previously followed by [facility]. Normal creatinine and no reason to repeat ultrasound   7. Essential hypertension:  Decreasing dose of medication as described above.   8. Hyperlipidemia: Decrease Crestor to 5 mg daily.  Follow-up in 2 months with CMP performed on the same day.  Dorothy Spark 01/22/2015

## 2015-03-03 ENCOUNTER — Ambulatory Visit (INDEPENDENT_AMBULATORY_CARE_PROVIDER_SITE_OTHER): Payer: Medicare Other | Admitting: *Deleted

## 2015-03-03 ENCOUNTER — Encounter: Payer: Self-pay | Admitting: Internal Medicine

## 2015-03-03 DIAGNOSIS — I441 Atrioventricular block, second degree: Secondary | ICD-10-CM

## 2015-03-04 NOTE — Progress Notes (Signed)
Remote pacemaker transmission.   

## 2015-03-06 LAB — CUP PACEART REMOTE DEVICE CHECK
Battery Remaining Longevity: 106 mo
Battery Remaining Percentage: 95.5 %
Battery Voltage: 3.01 V
Brady Statistic AS VP Percent: 12 %
Brady Statistic RV Percent Paced: 99 %
Lead Channel Impedance Value: 360 Ohm
Lead Channel Impedance Value: 590 Ohm
Lead Channel Pacing Threshold Amplitude: 0.875 V
Lead Channel Pacing Threshold Amplitude: 1 V
Lead Channel Pacing Threshold Pulse Width: 0.5 ms
Lead Channel Pacing Threshold Pulse Width: 0.5 ms
Lead Channel Sensing Intrinsic Amplitude: 2.1 mV
Lead Channel Setting Pacing Amplitude: 1.125
Lead Channel Setting Sensing Sensitivity: 3 mV
MDC IDC MSMT LEADCHNL RV SENSING INTR AMPL: 11.8 mV
MDC IDC SESS DTM: 20160606060018
MDC IDC SET LEADCHNL RA PACING AMPLITUDE: 2 V
MDC IDC SET LEADCHNL RV PACING PULSEWIDTH: 0.5 ms
MDC IDC STAT BRADY AP VP PERCENT: 87 %
MDC IDC STAT BRADY AP VS PERCENT: 1 %
MDC IDC STAT BRADY AS VS PERCENT: 1 %
MDC IDC STAT BRADY RA PERCENT PACED: 87 %
Pulse Gen Serial Number: 7663995

## 2015-03-18 ENCOUNTER — Encounter: Payer: Self-pay | Admitting: Cardiology

## 2015-03-27 ENCOUNTER — Encounter: Payer: Self-pay | Admitting: Cardiology

## 2015-03-27 ENCOUNTER — Other Ambulatory Visit (INDEPENDENT_AMBULATORY_CARE_PROVIDER_SITE_OTHER): Payer: Medicare Other | Admitting: *Deleted

## 2015-03-27 ENCOUNTER — Ambulatory Visit (INDEPENDENT_AMBULATORY_CARE_PROVIDER_SITE_OTHER): Payer: Medicare Other | Admitting: Cardiology

## 2015-03-27 VITALS — BP 124/60 | HR 64 | Ht 62.5 in | Wt 121.0 lb

## 2015-03-27 DIAGNOSIS — E785 Hyperlipidemia, unspecified: Secondary | ICD-10-CM | POA: Diagnosis not present

## 2015-03-27 DIAGNOSIS — I2510 Atherosclerotic heart disease of native coronary artery without angina pectoris: Secondary | ICD-10-CM

## 2015-03-27 DIAGNOSIS — I10 Essential (primary) hypertension: Secondary | ICD-10-CM | POA: Diagnosis not present

## 2015-03-27 DIAGNOSIS — I5033 Acute on chronic diastolic (congestive) heart failure: Secondary | ICD-10-CM

## 2015-03-27 DIAGNOSIS — I441 Atrioventricular block, second degree: Secondary | ICD-10-CM | POA: Diagnosis not present

## 2015-03-27 DIAGNOSIS — Z950 Presence of cardiac pacemaker: Secondary | ICD-10-CM | POA: Diagnosis not present

## 2015-03-27 DIAGNOSIS — I2583 Coronary atherosclerosis due to lipid rich plaque: Principal | ICD-10-CM

## 2015-03-27 LAB — BASIC METABOLIC PANEL
BUN: 32 mg/dL — ABNORMAL HIGH (ref 6–23)
CO2: 30 mEq/L (ref 19–32)
Calcium: 9.2 mg/dL (ref 8.4–10.5)
Chloride: 96 mEq/L (ref 96–112)
Creatinine, Ser: 1.15 mg/dL (ref 0.40–1.20)
GFR: 46.55 mL/min — ABNORMAL LOW (ref >60.00)
Glucose, Bld: 118 mg/dL — ABNORMAL HIGH (ref 70–99)
Potassium: 4.6 mEq/L (ref 3.5–5.1)
Sodium: 132 mEq/L — ABNORMAL LOW (ref 135–145)

## 2015-03-27 NOTE — Patient Instructions (Signed)
Your physician recommends that you continue on your current medications as directed. Please refer to the Current Medication list given to you today.     Your physician wants you to follow-up in: 6 MONTHS WITH DR NELSON You will receive a reminder letter in the mail two months in advance. If you don't receive a letter, please call our office to schedule the follow-up appointment.  

## 2015-03-27 NOTE — Progress Notes (Signed)
Patient ID: Stephanie Cochran, female   DOB: Oct 22, 1917, 81 y.o.   MRN: [id number]    Cardiology Office Note  Date:  03/27/2015   ID:  Stephanie Cochran, DOB 1917/10/15, MRN [id number]  PCP:  Cari Caraway, MD  Cardiologist:  Dr. Jenell Milliner >>> Dr. Ena Dawley     Chief complain: SOB, back pain  Primary Discharge Diagnosis:  Symptomatic Mobitz II heart block status post pacemaker implantation  Secondary Discharge Diagnosis:  1. CAD s/p CABG  2. Renal artery stenosis s/p stenting  3. Hyperlipidemia  4. Prior AVR  5. Breast cancer s/p left mastectomy  Stephanie Cochran is a 81 y.o. female with a past medical history significant for CAD (s/p CABG), renal artery stenosis (s/p stenting), hyperlipidemia, atrial fibrillation (post CABG), prior AVR, and breast cancer (s/p left mastectomy). She has had progressive dyspnea on exertion over the past month. Plans were for outpatient echo and myoview. When the patient presented for [facility], she was found to be in 2:1 heart block and was referred to [facility] for further evaluation.  She is on Carvedilol 6.25 twice daily at home with last dose 05-12-14 afternoon. AV block persists intermittently despite washout of this medicine.  She has had at least 2 falls in the last month. She denies frank syncope, but does report dizziness prior to falling. She is still very active and travelled to [place] recently with her family. She denies chest pain, lower extremity edema, recent fevers, chills, nausea or vomiting. ROS is otherwise negative.  Echo 05-13-14 demonstrated EF 60-65%, no RWMA, mild mitral stenosis, moderate MR, LA 45.  She was evaluated by Dr Rayann Heman with EP who recommended pacemaker implantation. Risks, benefits, and alternatives were reviewed with the patient who wished to proceed. The patient underwent implantation of a STJ dual chamber pacemaker with details as outlined above. She was monitored on telemetry overnight which demonstrated AV pacing. Left chest  was without hematoma or ecchymosis. The device was interrogated and found to be functioning normally. CXR was obtained and demonstrated no pneumothorax status post device implantation. Wound care, arm mobility, and restrictions were reviewed with the patient. Dr Rayann Heman examined the patient and considered them stable for discharge to home.  She was noted to be anemic on lab work. No melena, other obvious bleeding. Pt advised to follow up with PCP regarding anemia.   01/22/2015 - the patient is coming after 6 months she feels very well has no palpitations or syncope her pacemaker site healed very well and she doesn't have any pain at the implantation site. She has been checked by EP clinic and her pacemaker is working appropriately. She has also obtained wireless report and her pacemaker is working real well. At the last visit we have referred her for cardiac rehabilitation however it was too far and she is able to get there. She is asking about home physical therapy. She denies any chest pain, but has complained of dyspnea on exertion.. She has been experiencing problem with her balance and fell multiple times in the recent times and hit her knee.  03/27/15 - 2 months follow up, improved LE edema, no CP, stable DOE, back pain occurs before DOE (spinal stenosis), no orthopnea, PND, no palpitations, PM working properly, no falls.   Recent Labs:  Wt Readings from Last 3 Encounters:  03/27/15 121 lb (54.885 kg)  01/22/15 124 lb (56.246 kg)  08/30/14 120 lb (54.432 kg)     Past Medical History  Diagnosis Date  . CAD (  coronary artery disease)     s/p CABG 2007 at [facility]  . HTN (hypertension)   . PVD (peripheral vascular disease)   . Diverticulosis     history  . HLD (hyperlipidemia)   . Atrial fibrillation     post operative  . Atrial flutter     post operative  . Cellulitis     left forearm  . Aortic stenosis     a. s/p bioprosthetic AVR 2007 [facility]);  b. Echo (04/2009 - [facility] in  [place], [place]):  Mild LVH, normal LVF, anterior WMA, AVR ok (mean 4 mmHg), MAC, mild MR, mild TV stenosis, RVSP 44 mmHg  . Breast cancer     left mastectomy  . Post-menopausal   . Arthritis   . Pacemaker 05/14/2014    dual chamber st jude   Dr Joylene Grapes  . Pneumonia     hx of pna  . Dysrhythmia 04/2014    second degree heart block  . Hx of echocardiogram     Echo (8/15):  Mild focal basal septal hypertrophy, EF 60-65%, no RWMA, AVR ok (mean 8 mmHg), MAC, mild MS (mean 4 mmHg), mod MR, mild LAE, PASP 54 mmHg    Current Outpatient Prescriptions  Medication Sig Dispense Refill  . amLODipine (NORVASC) 10 MG tablet Take 1 tablet (10 mg total) by mouth daily. 90 tablet 1  . aspirin EC 81 MG tablet Take 81 mg by mouth daily.    . carvedilol (COREG) 6.25 MG tablet Take 1 tablet (6.25 mg total) by mouth daily. 90 tablet 3  . clopidogrel (PLAVIX) 75 MG tablet Take 1 tablet (75 mg total) by mouth daily. 90 tablet 3  . Cyanocobalamin (B-12) 1000 MCG CAPS Take 1,000 mcg by mouth daily.     . diphenhydramine-acetaminophen (TYLENOL PM) 25-500 MG TABS Take 0.5 tablets by mouth at bedtime as needed.     Marland Kitchen esomeprazole (NEXIUM) 20 MG packet Take 20 mg by mouth daily before breakfast. 30 each 12  . furosemide (LASIX) 20 MG tablet Take 1 tablet (20 mg total) by mouth daily. 90 tablet 3  . ibuprofen (ADVIL,MOTRIN) 200 MG tablet Take 400 mg by mouth daily as needed (back pain).    Marland Kitchen letrozole (FEMARA) 2.5 MG tablet Take 1 tablet (2.5 mg total) by mouth daily. 90 tablet 1  . olmesartan-hydrochlorothiazide (BENICAR HCT) 20-12.5 MG per tablet Take 1 tablet by mouth daily. 90 tablet 1  . Polyethyl Glycol-Propyl Glycol (SYSTANE OP) Place 1 drop into both eyes 2 (two) times daily as needed (dry eyes).    . rosuvastatin (CRESTOR) 5 MG tablet Take 1 tablet (5 mg total) by mouth daily. 90 tablet 3  . vitamin C (ASCORBIC ACID) 500 MG tablet Take 1,000 mg by mouth daily.    . Vitamin D, Ergocalciferol, (DRISDOL) 50000 UNITS CAPS  capsule Take 1 capsule (50,000 Units total) by mouth every 30 (thirty) days. On or about the 1st of each month 6 capsule 0  . vitamin E 400 UNIT capsule Take 400 Units by mouth daily.     No current facility-administered medications for this visit.    Allergies:   Codeine; Penicillins; and Vicodin   Social History:  The patient  reports that she quit smoking about 54 years ago. She has never used smokeless tobacco. She reports that she does not drink alcohol or use illicit drugs.   Family History:  The patient's family history includes Arthritis in an other family member; Colon cancer in her sister;  Diabetes in an other family member; Heart disease in an other family member.   ROS:  Please see the history of present illness.      All other systems reviewed and negative.   PHYSICAL EXAM: VS:  BP 124/60 mmHg  Pulse 64  Ht 5' 2.5" (1.588 m)  Wt 121 lb (54.885 kg)  BMI 21.76 kg/m2 Well nourished, well developed, in no acute distress HEENT: normal Neck: no JVD Lef upper chest wall, swelling around the PM insertion site, no increase warmth  Cardiac:  normal S1, S2; RRR; 2/6 systolic murmurRUSB Lungs:  clear to auscultation bilaterally, no wheezing, rhonchi or rales Abd: soft, nontender, no hepatomegaly Ext: trace to 1+ bilateral ankle edema Skin: warm and dry Neuro:  CNs 2-12 intact, no focal abnormalities noted  Echocardiogram: August 2015 Left ventricle: E/e&'>16.2 suggestive of elevated LV filling pressure. The cavity size was normal. There was mild focal basal hypertrophy of the septum. Systolic function was normal. The estimated ejection fraction was in the range of 60% to 65%. Wall motion was normal; there were no regional wall motion abnormalities. - Aortic valve: A bioprosthesis was present. - Mitral valve: Severely calcified annulus. Mildly thickened leaflets . Mild thickening and calcification, with mild involvement of chords. The findings are consistent  with mild stenosis. There was moderate regurgitation. - Left atrium: The atrium was mildly dilated. - Pulmonary arteries: PA peak pressure: 54 mm Hg (S).  EKG:  Sinus brady, HR 52, LAD, 1st degree AVB, no change from prior tracing      ASSESSMENT AND PLAN:  1. Acute diastolic CHF - started on Lasix 20 mg daily, her last echocardiogram last year showed grade 2 diastolic dysfunction with elevated filling pressure and moderate pulmonary hypertension. She is already on amlodipine 10 mg daily. Lost 3 lbs with low dose lasix, improved LE edema, we will check CMP today.  2. Symptomatic Mobitz II heart block status post pacemaker implantation on 05/13/2014 - Healed insertion site, no sign of infection, functioning well based on the interrogation, resolved symptoms, normal function of the pacemaker as per report in March 2016.  3. Coronary artery disease:  Asymptomatic, Continue ASA, beta blocker.  We will decrease the dose of statin from 20 mg to only 5 mg of Crestor daily.  4. Balance problems and falls - improved with discontinuation of hydrochlorothiazide, Imdur.    5. History of aortic valve replacement with bioprosthetic valve - Plan: 2D Echocardiogram without contrast  6. History of renal artery stenosis:  Previously followed by [facility]. Normal creatinine and no reason to repeat ultrasound   7. Essential hypertension:  controlled.   8. Hyperlipidemia: Decrease Crestor to 5 mg daily.  Follow-up in 2 months with CMP performed on the same day.  Stephanie Cochran 03/27/2015

## 2015-06-03 ENCOUNTER — Encounter: Payer: Self-pay | Admitting: Internal Medicine

## 2015-06-03 ENCOUNTER — Ambulatory Visit (INDEPENDENT_AMBULATORY_CARE_PROVIDER_SITE_OTHER): Payer: Medicare Other | Admitting: *Deleted

## 2015-06-03 DIAGNOSIS — I442 Atrioventricular block, complete: Secondary | ICD-10-CM

## 2015-06-06 NOTE — Progress Notes (Signed)
Remote pacemaker transmission.   

## 2015-06-10 ENCOUNTER — Other Ambulatory Visit: Payer: Self-pay

## 2015-06-10 DIAGNOSIS — I10 Essential (primary) hypertension: Secondary | ICD-10-CM

## 2015-06-10 DIAGNOSIS — E785 Hyperlipidemia, unspecified: Secondary | ICD-10-CM

## 2015-06-10 DIAGNOSIS — I2510 Atherosclerotic heart disease of native coronary artery without angina pectoris: Secondary | ICD-10-CM

## 2015-06-10 MED ORDER — OLMESARTAN MEDOXOMIL-HCTZ 20-12.5 MG PO TABS: 1.0000 | ORAL_TABLET | Freq: Every day | ORAL | Status: DC

## 2015-06-10 NOTE — Telephone Encounter (Signed)
Stephanie Spark, MD at 03/27/2015 1:53 PM . olmesartan-hydrochlorothiazide (BENICAR HCT) 20-12.5 MG per tabletTake 1 tablet by mouth daily. Patient Instructions     Your physician recommends that you continue on your current medications as directed. Please refer to the Current Medication list given to you today.

## 2015-06-10 NOTE — Telephone Encounter (Signed)
Medication Detail      Disp Refills Start End     rosuvastatin (CRESTOR) 5 MG tablet 90 tablet 3 01/22/2015     Sig - Route: Take 1 tablet (5 mg total) by mouth daily. - Oral    E-Prescribing Status: Receipt confirmed by pharmacy (01/22/2015 2:39 PM EDT)     Associated Diagnoses    Coronary artery disease involving native coronary artery of native heart without angina pectoris - Primary      Essential hypertension      Hyperlipidemia       Pharmacy    [facility] [place], [place]

## 2015-06-11 ENCOUNTER — Other Ambulatory Visit: Payer: Self-pay

## 2015-06-11 DIAGNOSIS — I10 Essential (primary) hypertension: Secondary | ICD-10-CM

## 2015-06-11 DIAGNOSIS — I2510 Atherosclerotic heart disease of native coronary artery without angina pectoris: Secondary | ICD-10-CM

## 2015-06-11 DIAGNOSIS — E785 Hyperlipidemia, unspecified: Secondary | ICD-10-CM

## 2015-06-11 LAB — CUP PACEART REMOTE DEVICE CHECK
Battery Remaining Longevity: 113 mo
Battery Remaining Percentage: 95.5 %
Battery Voltage: 3.01 V
Brady Statistic AP VP Percent: 89 %
Brady Statistic AP VS Percent: 1 %
Brady Statistic AS VP Percent: 11 %
Brady Statistic AS VS Percent: 1 %
Brady Statistic RA Percent Paced: 88 %
Brady Statistic RV Percent Paced: 99 %
Date Time Interrogation Session: 20160906090531
Lead Channel Impedance Value: 380 Ohm
Lead Channel Impedance Value: 640 Ohm
Lead Channel Pacing Threshold Amplitude: 0.875 V
Lead Channel Pacing Threshold Amplitude: 1 V
Lead Channel Pacing Threshold Pulse Width: 0.5 ms
Lead Channel Pacing Threshold Pulse Width: 0.5 ms
Lead Channel Sensing Intrinsic Amplitude: 2.3 mV
Lead Channel Sensing Intrinsic Amplitude: 3.6 mV
Lead Channel Setting Pacing Amplitude: 1.125
Lead Channel Setting Pacing Amplitude: 2 V
Lead Channel Setting Pacing Pulse Width: 0.5 ms
Lead Channel Setting Sensing Sensitivity: 3 mV
Pulse Gen Model: 2240
Pulse Gen Serial Number: 7663995

## 2015-06-11 MED ORDER — ROSUVASTATIN CALCIUM 5 MG PO TABS: 5.0000 mg | ORAL_TABLET | Freq: Every day | ORAL | Status: DC

## 2015-06-16 ENCOUNTER — Encounter: Payer: Self-pay | Admitting: *Deleted

## 2015-06-17 ENCOUNTER — Encounter: Payer: Self-pay | Admitting: Cardiology

## 2015-07-24 ENCOUNTER — Telehealth: Payer: Self-pay | Admitting: Cardiology

## 2015-07-24 NOTE — Telephone Encounter (Signed)
Will fax this note to Dr Nelva Bush at [facility] with Dr Francesca Oman recommendations.

## 2015-07-24 NOTE — Telephone Encounter (Signed)
New message:   Request for surgical clearance:  1. What type of surgery is being performed? Dr. Nelva Bush  2. When is this surgery scheduled? Shots for back pain   dx  Spanial stinosis  3. Are there any medications that need to be held prior to surgery and how long? Plavix  Off 3 days  4. Name of physician performing surgery?  Dr.  Nelva Bush  5. What is your office phone and fax number? #[phone]  6.

## 2015-07-24 NOTE — Telephone Encounter (Signed)
Will route this medication/ to Dr Meda Coffee for further review and recommendation and follow-up with the pts Orthopedic Doctor, Dr Nelva Bush with clearance recommendations.

## 2015-07-24 NOTE — Telephone Encounter (Signed)
There is currently no contraindication from cardiac standpoint for her to undergo the scheduled surgery.   Also, she can stop taking Plavix

## 2015-07-25 ENCOUNTER — Telehealth: Payer: Self-pay | Admitting: Cardiology

## 2015-07-25 NOTE — Telephone Encounter (Signed)
Spoke with patient, informed the consent was sent to Dr. Nelva Bush' office yesterday per telephone note 10/27.

## 2015-07-25 NOTE — Telephone Encounter (Signed)
New Message    Pt calling stating that Dr. Nelva Bush wants pt off Plavix for 3 days. Please call back. Leave a detailed message if pt isn't home.

## 2015-09-01 ENCOUNTER — Encounter: Payer: Medicare Other | Admitting: Internal Medicine

## 2015-09-03 ENCOUNTER — Encounter: Payer: Self-pay | Admitting: Internal Medicine

## 2015-09-03 ENCOUNTER — Other Ambulatory Visit: Payer: Self-pay

## 2015-09-03 ENCOUNTER — Telehealth: Payer: Self-pay | Admitting: Cardiology

## 2015-09-03 ENCOUNTER — Ambulatory Visit (INDEPENDENT_AMBULATORY_CARE_PROVIDER_SITE_OTHER): Payer: Medicare Other | Admitting: Cardiology

## 2015-09-03 ENCOUNTER — Encounter: Payer: Self-pay | Admitting: Cardiology

## 2015-09-03 ENCOUNTER — Ambulatory Visit (INDEPENDENT_AMBULATORY_CARE_PROVIDER_SITE_OTHER): Payer: Medicare Other | Admitting: Internal Medicine

## 2015-09-03 VITALS — BP 142/82 | HR 60 | Ht 62.5 in | Wt 119.0 lb

## 2015-09-03 VITALS — BP 142/82 | HR 60 | Ht 62.0 in | Wt 119.0 lb

## 2015-09-03 DIAGNOSIS — I10 Essential (primary) hypertension: Secondary | ICD-10-CM

## 2015-09-03 DIAGNOSIS — I272 Other secondary pulmonary hypertension: Secondary | ICD-10-CM

## 2015-09-03 DIAGNOSIS — I5032 Chronic diastolic (congestive) heart failure: Secondary | ICD-10-CM

## 2015-09-03 DIAGNOSIS — Z954 Presence of other heart-valve replacement: Secondary | ICD-10-CM

## 2015-09-03 DIAGNOSIS — I441 Atrioventricular block, second degree: Secondary | ICD-10-CM

## 2015-09-03 DIAGNOSIS — Z950 Presence of cardiac pacemaker: Secondary | ICD-10-CM | POA: Diagnosis not present

## 2015-09-03 DIAGNOSIS — Z952 Presence of prosthetic heart valve: Secondary | ICD-10-CM

## 2015-09-03 DIAGNOSIS — I480 Paroxysmal atrial fibrillation: Secondary | ICD-10-CM

## 2015-09-03 DIAGNOSIS — I2510 Atherosclerotic heart disease of native coronary artery without angina pectoris: Secondary | ICD-10-CM | POA: Diagnosis not present

## 2015-09-03 DIAGNOSIS — E785 Hyperlipidemia, unspecified: Secondary | ICD-10-CM

## 2015-09-03 DIAGNOSIS — I442 Atrioventricular block, complete: Secondary | ICD-10-CM | POA: Diagnosis not present

## 2015-09-03 DIAGNOSIS — I2583 Coronary atherosclerosis due to lipid rich plaque: Secondary | ICD-10-CM

## 2015-09-03 MED ORDER — CLOPIDOGREL BISULFATE 75 MG PO TABS: 75.0000 mg | ORAL_TABLET | Freq: Every day | ORAL | Status: DC

## 2015-09-03 MED ORDER — ROSUVASTATIN CALCIUM 5 MG PO TABS: 5.0000 mg | ORAL_TABLET | Freq: Every day | ORAL | Status: DC

## 2015-09-03 MED ORDER — OLMESARTAN MEDOXOMIL-HCTZ 20-12.5 MG PO TABS: 1.0000 | ORAL_TABLET | Freq: Every day | ORAL | Status: DC

## 2015-09-03 MED ORDER — CARVEDILOL 6.25 MG PO TABS: 6.2500 mg | ORAL_TABLET | Freq: Every day | ORAL | Status: AC

## 2015-09-03 MED ORDER — AMLODIPINE BESYLATE 10 MG PO TABS: 10.0000 mg | ORAL_TABLET | Freq: Every day | ORAL | Status: DC

## 2015-09-03 NOTE — Progress Notes (Signed)
Patient ID: Stephanie Cochran, female   DOB: April 07, 1918, 81 y.o.   MRN: [id number]    Cardiology Office Note  Date:  09/03/2015   ID:  Stephanie Cochran, DOB [birth year], MRN [id number]  PCP:  Cari Caraway, MD  Cardiologist:  Dr. Jenell Milliner >>> Dr. Ena Dawley     Chief complain: SOB, back pain  Primary Discharge Diagnosis:  Symptomatic Mobitz II heart block status post pacemaker implantation  Secondary Discharge Diagnosis:  1. CAD s/p CABG  2. Renal artery stenosis s/p stenting  3. Hyperlipidemia  4. Prior AVR  5. Breast cancer s/p left mastectomy  Stephanie Cochran is a 81 y.o. female with a past medical history significant for CAD (s/p CABG), renal artery stenosis (s/p stenting), hyperlipidemia, atrial fibrillation (post CABG), prior AVR, and breast cancer (s/p left mastectomy). She has had progressive dyspnea on exertion over the past month. Plans were for outpatient echo and myoview. When the patient presented for [facility], she was found to be in 2:1 heart block and was referred to [facility] for further evaluation.  She is on Carvedilol 6.25 twice daily at home with last dose 05-12-14 afternoon. AV block persists intermittently despite washout of this medicine.  She has had at least 2 falls in the last month. She denies frank syncope, but does report dizziness prior to falling. She is still very active and travelled to [place] recently with her family. She denies chest pain, lower extremity edema, recent fevers, chills, nausea or vomiting. ROS is otherwise negative.  Echo 05-13-14 demonstrated EF 60-65%, no RWMA, mild mitral stenosis, moderate MR, LA 45.  She was evaluated by Dr Rayann Heman with EP who recommended pacemaker implantation. Risks, benefits, and alternatives were reviewed with the patient who wished to proceed. The patient underwent implantation of a STJ dual chamber pacemaker with details as outlined above. She was monitored on telemetry overnight which demonstrated AV pacing. Left chest  was without hematoma or ecchymosis. The device was interrogated and found to be functioning normally. CXR was obtained and demonstrated no pneumothorax status post device implantation. Wound care, arm mobility, and restrictions were reviewed with the patient. Dr Rayann Heman examined the patient and considered them stable for discharge to home.  She was noted to be anemic on lab work. No melena, other obvious bleeding. Pt advised to follow up with PCP regarding anemia.   01/22/2015 - the patient is coming after 6 months she feels very well has no palpitations or syncope her pacemaker site healed very well and she doesn't have any pain at the implantation site. She has been checked by EP clinic and her pacemaker is working appropriately. She has also obtained wireless report and her pacemaker is working real well. At the last visit we have referred her for cardiac rehabilitation however it was too far and she is able to get there. She is asking about home physical therapy. She denies any chest pain, but has complained of dyspnea on exertion.. She has been experiencing problem with her balance and fell multiple times in the recent times and hit her knee.  09/04/15 - 6 months follow up, improved LE edema, no CP, stable DOE, back pain occurs before DOE (spinal stenosis), no orthopnea, PND, no palpitations, PM working properly, it was just checked today with Dr Rayann Heman, she is in a-fib less than 1% of time, no falls. She remains active and independent.She would like to decrease anount of pills she is taking.   Recent Labs:  Wt Readings from Last 3  Encounters:  09/03/15 119 lb (53.978 kg)  09/03/15 119 lb (53.978 kg)  03/27/15 121 lb (54.885 kg)     Past Medical History  Diagnosis Date  . CAD (coronary artery disease)     s/p CABG 2007 at [facility]  . HTN (hypertension)   . PVD (peripheral vascular disease) ([place])   . Diverticulosis     history  . HLD (hyperlipidemia)   . Paroxysmal atrial fibrillation (HCC)     . Atrial flutter ([place])     post operative  . Cellulitis     left forearm  . Aortic stenosis     a. s/p bioprosthetic AVR 2007 [facility]);  b. Echo (04/2009 - [facility] in [place], [place]):  Mild LVH, normal LVF, anterior WMA, AVR ok (mean 4 mmHg), MAC, mild MR, mild TV stenosis, RVSP 44 mmHg  . Breast cancer ([place])     left mastectomy  . Post-menopausal   . Arthritis   . Pacemaker 05/14/2014    dual chamber st jude   Dr Joylene Grapes  . Pneumonia     hx of pna  . Dysrhythmia 04/2014    second degree heart block  . Hx of echocardiogram     Echo (8/15):  Mild focal basal septal hypertrophy, EF 60-65%, no RWMA, AVR ok (mean 8 mmHg), MAC, mild MS (mean 4 mmHg), mod MR, mild LAE, PASP 54 mmHg    Current Outpatient Prescriptions  Medication Sig Dispense Refill  . amLODipine (NORVASC) 10 MG tablet Take 1 tablet (10 mg total) by mouth daily. 90 tablet 1  . aspirin EC 81 MG tablet Take 81 mg by mouth daily.    . carvedilol (COREG) 6.25 MG tablet Take 1 tablet (6.25 mg total) by mouth daily. 90 tablet 3  . Cholecalciferol (VITAMIN D) 2000 UNITS tablet Take 2,000 Units by mouth daily.    . clopidogrel (PLAVIX) 75 MG tablet Take 1 tablet (75 mg total) by mouth daily. 90 tablet 3  . Cyanocobalamin (B-12) 1000 MCG CAPS Take 1,000 mcg by mouth daily.     . diphenhydramine-acetaminophen (TYLENOL PM) 25-500 MG TABS Take 0.5 tablets by mouth at bedtime as needed (sleep).     Marland Kitchen letrozole (FEMARA) 2.5 MG tablet Take 1 tablet (2.5 mg total) by mouth daily. 90 tablet 1  . olmesartan-hydrochlorothiazide (BENICAR HCT) 20-12.5 MG per tablet Take 1 tablet by mouth daily. 90 tablet 0  . Polyethyl Glycol-Propyl Glycol (SYSTANE OP) Place 1 drop into both eyes 2 (two) times daily as needed (dry eyes).    . rosuvastatin (CRESTOR) 5 MG tablet Take 1 tablet (5 mg total) by mouth daily. 90 tablet 3  . vitamin C (ASCORBIC ACID) 500 MG tablet Take 1,000 mg by mouth daily.    . Vitamin D, Ergocalciferol, (DRISDOL) 50000 UNITS CAPS  capsule Take 1 capsule (50,000 Units total) by mouth every 30 (thirty) days. On or about the 1st of each month 6 capsule 0  . vitamin E 400 UNIT capsule Take 400 Units by mouth daily.     No current facility-administered medications for this visit.    Allergies:   Codeine; Penicillins; and Vicodin   Social History:  The patient  reports that she quit smoking about 54 years ago. She has never used smokeless tobacco. She reports that she does not drink alcohol or use illicit drugs.   Family History:  The patient's family history includes Arthritis in an other family member; Colon cancer in her sister; Diabetes in an other family member;  Heart disease in an other family member.   ROS:  Please see the history of present illness.      All other systems reviewed and negative.   PHYSICAL EXAM: VS:  BP 142/82 mmHg  Pulse 60  Ht 5\' 2"  (1.575 m)  Wt 119 lb (53.978 kg)  BMI 21.76 kg/m2 Well nourished, well developed, in no acute distress HEENT: normal Neck: no JVD Lef upper chest wall, swelling around the PM insertion site, no increase warmth  Cardiac:  normal S1, S2; RRR; 2/6 systolic murmurRUSB Lungs:  clear to auscultation bilaterally, no wheezing, rhonchi or rales Abd: soft, nontender, no hepatomegaly Ext: trace to 1+ bilateral ankle edema Skin: warm and dry Neuro:  CNs 2-12 intact, no focal abnormalities noted  Echocardiogram: August 2015 Left ventricle: E/e&'>16.2 suggestive of elevated LV filling pressure. The cavity size was normal. There was mild focal basal hypertrophy of the septum. Systolic function was normal. The estimated ejection fraction was in the range of 60% to 65%. Wall motion was normal; there were no regional wall motion abnormalities. - Aortic valve: A bioprosthesis was present. - Mitral valve: Severely calcified annulus. Mildly thickened leaflets . Mild thickening and calcification, with mild involvement of chords. The findings are consistent with  mild stenosis. There was moderate regurgitation. - Left atrium: The atrium was mildly dilated. - Pulmonary arteries: PA peak pressure: 54 mm Hg (S).  EKG:  Sinus brady, HR 52, LAD, 1st degree AVB, no change from prior tracing     ASSESSMENT AND PLAN:  1. Chronic diastolic CHF - euvolemic on low dose lasix 20 mg po daily, with elevated filling pressure and moderate pulmonary hypertension. She is already on amlodipine 10 mg daily.  2. Symptomatic Mobitz II heart block status post pacemaker implantation on 05/13/2014 Normal PM function, interrogated today.   3. Coronary artery disease:  Asymptomatic, Continue ASA, beta blocker.  Continue low dose Crestor 5 mg po daily.  4. Balance problems and falls - improved with discontinuation of hydrochlorothiazide, Imdur.    5. History of aortic valve replacement with bioprosthetic valve - Normal EV function on echo in 2015.  6. History of renal artery stenosis:  Previously followed by [facility]. Normal creatinine and no reason to repeat ultrasound   7. Essential hypertension:  controlled.   8. Hyperlipidemia: Decrease Crestor to 5 mg daily.  Follow-up in 6 months.  Dorothy Spark 09/03/2015

## 2015-09-03 NOTE — Patient Instructions (Signed)
Medication Instructions:  Your physician recommends that you continue on your current medications as directed. Please refer to the Current Medication list given to you today.   Labwork: None ordered   Testing/Procedures: None ordered   Follow-Up: Remote monitoring is used to monitor your Pacemaker  from home. This monitoring reduces the number of office visits required to check your device to one time per year. It allows [place] to keep an eye on the functioning of your device to ensure it is working properly. You are scheduled for a device check from home on 12/03/15. You may send your transmission at any time that day. If you have a wireless device, the transmission will be sent automatically. After your physician reviews your transmission, you will receive a postcard with your next transmission date.  Your physician wants you to follow-up in: 12 months with Chanetta Marshall, NP You will receive a reminder letter in the mail two months in advance. If you don't receive a letter, please call our office to schedule the follow-up appointment.     Any Other Special Instructions Will Be Listed Below (If Applicable).     If you need a refill on your cardiac medications before your next appointment, please call your pharmacy.

## 2015-09-03 NOTE — Telephone Encounter (Signed)
New Message  Pt requested to speak w/ RN about additional questions from today 12/7 appt. Please call back and discuss.

## 2015-09-03 NOTE — Patient Instructions (Signed)
Your physician recommends that you continue on your current medications as directed. Please refer to the Current Medication list given to you today.     Your physician wants you to follow-up in: 6 MONTHS WITH DR NELSON You will receive a reminder letter in the mail two months in advance. If you don't receive a letter, please call our office to schedule the follow-up appointment.  

## 2015-09-03 NOTE — Progress Notes (Signed)
PCP: MCNEILL,WENDY, MD Primary Cardiologist:  Dr Mickie Kay is a 81 y.o. female who presents today for routine electrophysiology followup.  She is doing well for her age.  Continues to have occasional SOB. Today, she denies symptoms of palpitations, chest pain, lower extremity edema, dizziness, presyncope, or syncope.  The patient is otherwise without complaint today.   Past Medical History  Diagnosis Date  . CAD (coronary artery disease)     s/p CABG 2007 at [facility]  . HTN (hypertension)   . PVD (peripheral vascular disease) ([place])   . Diverticulosis     history  . HLD (hyperlipidemia)   . Paroxysmal atrial fibrillation (HCC)   . Atrial flutter ([place])     post operative  . Cellulitis     left forearm  . Aortic stenosis     a. s/p bioprosthetic AVR 2007 [facility]);  b. Echo (04/2009 - [facility] in [place], [place]):  Mild LVH, normal LVF, anterior WMA, AVR ok (mean 4 mmHg), MAC, mild MR, mild TV stenosis, RVSP 44 mmHg  . Breast cancer ([place])     left mastectomy  . Post-menopausal   . Arthritis   . Pacemaker 05/14/2014    dual chamber st jude   Dr Joylene Grapes  . Pneumonia     hx of pna  . Dysrhythmia 04/2014    second degree heart block  . Hx of echocardiogram     Echo (8/15):  Mild focal basal septal hypertrophy, EF 60-65%, no RWMA, AVR ok (mean 8 mmHg), MAC, mild MS (mean 4 mmHg), mod MR, mild LAE, PASP 54 mmHg   Past Surgical History  Procedure Laterality Date  . Renal artery stent  2006, 2010  . Aortic valve replacement  2007    with 54mm [place] pericardial tissue valve   . Coronary artery bypass graft Left 2007    x3, left internal mammary artery to the left anterior descending, saphenous vein graft tot he circumflex coronary  . Arteriovenous graft placement w/ endoscopic vein harvest Left     left leg  . Mastectomy Left 2008    left, breast cancer, followed by a hematology oncologist  . Tonsillectomy  1943  . Hemorroidectomy    . Breast surgery    . Eye surgery     . Cardiac valve replacement    . Pacemaker insertion  05/14/2014    SJM Assurity DR implanted by Dr Rayann Heman for complete heart block  . Permanent pacemaker insertion N/A 05/14/2014    Procedure: PERMANENT PACEMAKER INSERTION;  Surgeon: Coralyn Mark, MD;  Location: [place] CATH LAB;  Service: Cardiovascular;  Laterality: N/A;    ROS- all systems are reviewed and negative except as per HPI above  Current Outpatient Prescriptions  Medication Sig Dispense Refill  . amLODipine (NORVASC) 10 MG tablet Take 1 tablet (10 mg total) by mouth daily. 90 tablet 1  . aspirin EC 81 MG tablet Take 81 mg by mouth daily.    . carvedilol (COREG) 6.25 MG tablet Take 1 tablet (6.25 mg total) by mouth daily. 90 tablet 3  . Cholecalciferol (VITAMIN D) 2000 UNITS tablet Take 2,000 Units by mouth daily.    . clopidogrel (PLAVIX) 75 MG tablet Take 1 tablet (75 mg total) by mouth daily. 90 tablet 3  . Cyanocobalamin (B-12) 1000 MCG CAPS Take 1,000 mcg by mouth daily.     . diphenhydramine-acetaminophen (TYLENOL PM) 25-500 MG TABS Take 0.5 tablets by mouth at bedtime as needed (sleep).     Marland Kitchen  letrozole (FEMARA) 2.5 MG tablet Take 1 tablet (2.5 mg total) by mouth daily. 90 tablet 1  . olmesartan-hydrochlorothiazide (BENICAR HCT) 20-12.5 MG per tablet Take 1 tablet by mouth daily. 90 tablet 0  . Polyethyl Glycol-Propyl Glycol (SYSTANE OP) Place 1 drop into both eyes 2 (two) times daily as needed (dry eyes).    . rosuvastatin (CRESTOR) 5 MG tablet Take 1 tablet (5 mg total) by mouth daily. 90 tablet 3  . vitamin C (ASCORBIC ACID) 500 MG tablet Take 1,000 mg by mouth daily.    . Vitamin D, Ergocalciferol, (DRISDOL) 50000 UNITS CAPS capsule Take 1 capsule (50,000 Units total) by mouth every 30 (thirty) days. On or about the 1st of each month 6 capsule 0  . vitamin E 400 UNIT capsule Take 400 Units by mouth daily.     No current facility-administered medications for this visit.    Physical Exam: Filed Vitals:   09/03/15 1024   BP: 142/82  Pulse: 60  Height: 5' 2.5" (1.588 m)  Weight: 119 lb (53.978 kg)    GEN- The patient is well appearing, alert and oriented x 3 today.   Head- normocephalic, atraumatic Eyes-  Sclera clear, conjunctiva pink Ears- hearing intact Oropharynx- clear Lungs- Clear to ausculation bilaterally, normal work of breathing Chest- pacemaker pocket is well healed Heart- Regular rate and rhythm, no murmurs, rubs or gallops, PMI not laterally displaced GI- soft, NT, ND, + BS Extremities- no clubbing, cyanosis, or edema  Pacemaker interrogation- reviewed in detail today,  See PACEART report  Assessment and Plan:  1. Complete heart block Normal pacemaker function See Pace Art report Rate response adjusted to try to improve exertional SOB  2. HTN Stable No change required today  3. CAD No ischemic symptoms No changes today  4. afib Burden is <1%   Given advanced age, will follow AF burden and reserve anticoagulation for worsening af  She has an appointment with Dr Meda Coffee today [place] Return to see EP NP in 1 year   [name] MD, [facility] 09/03/2015 10:45 AM

## 2015-09-03 NOTE — Telephone Encounter (Signed)
Pt calling because she forgot to mention that she is out of all her cardiac meds starting in one week, and she meant to tell us this at today's OV with Dr Meda Coffee.  Refilled all the pts cardiac meds to her confirmed pharmacy of choice, for a 90 day supply.  Pt verbalized understanding and gracious for all the assistance provided.

## 2015-09-24 LAB — CUP PACEART INCLINIC DEVICE CHECK
Date Time Interrogation Session: 20161228161455
Implantable Lead Implant Date: 20150818
Implantable Lead Implant Date: 20150818
Implantable Lead Location: 753860
Lead Channel Setting Pacing Amplitude: 1.125
Lead Channel Setting Pacing Amplitude: 2 V
Lead Channel Setting Pacing Pulse Width: 0.5 ms
MDC IDC LEAD LOCATION: 753859
MDC IDC LEAD MODEL: 1948
MDC IDC PG SERIAL: 7663995
MDC IDC SET LEADCHNL RV SENSING SENSITIVITY: 3 mV
Pulse Gen Model: 2240

## 2015-10-03 ENCOUNTER — Ambulatory Visit: Payer: Medicare Other | Admitting: Cardiology

## 2015-10-24 ENCOUNTER — Emergency Department (HOSPITAL_BASED_OUTPATIENT_CLINIC_OR_DEPARTMENT_OTHER): Payer: Medicare Other

## 2015-10-24 ENCOUNTER — Emergency Department (HOSPITAL_BASED_OUTPATIENT_CLINIC_OR_DEPARTMENT_OTHER)
Admission: EM | Admit: 2015-10-24 | Discharge: 2015-10-24 | Disposition: A | Discharge status: Home / Self Care | Payer: Medicare Other | Attending: Emergency Medicine | Admitting: Emergency Medicine

## 2015-10-24 ENCOUNTER — Encounter (HOSPITAL_BASED_OUTPATIENT_CLINIC_OR_DEPARTMENT_OTHER): Payer: Self-pay | Admitting: *Deleted

## 2015-10-24 DIAGNOSIS — I4892 Unspecified atrial flutter: Secondary | ICD-10-CM | POA: Insufficient documentation

## 2015-10-24 DIAGNOSIS — E785 Hyperlipidemia, unspecified: Secondary | ICD-10-CM | POA: Insufficient documentation

## 2015-10-24 DIAGNOSIS — Z951 Presence of aortocoronary bypass graft: Secondary | ICD-10-CM | POA: Insufficient documentation

## 2015-10-24 DIAGNOSIS — Z950 Presence of cardiac pacemaker: Secondary | ICD-10-CM | POA: Insufficient documentation

## 2015-10-24 DIAGNOSIS — M1990 Unspecified osteoarthritis, unspecified site: Secondary | ICD-10-CM | POA: Insufficient documentation

## 2015-10-24 DIAGNOSIS — R531 Weakness: Secondary | ICD-10-CM | POA: Insufficient documentation

## 2015-10-24 DIAGNOSIS — Z872 Personal history of diseases of the skin and subcutaneous tissue: Secondary | ICD-10-CM | POA: Insufficient documentation

## 2015-10-24 DIAGNOSIS — Z853 Personal history of malignant neoplasm of breast: Secondary | ICD-10-CM | POA: Diagnosis not present

## 2015-10-24 DIAGNOSIS — Z79899 Other long term (current) drug therapy: Secondary | ICD-10-CM | POA: Insufficient documentation

## 2015-10-24 DIAGNOSIS — Z8719 Personal history of other diseases of the digestive system: Secondary | ICD-10-CM | POA: Insufficient documentation

## 2015-10-24 DIAGNOSIS — I2510 Atherosclerotic heart disease of native coronary artery without angina pectoris: Secondary | ICD-10-CM | POA: Insufficient documentation

## 2015-10-24 DIAGNOSIS — Z780 Asymptomatic menopausal state: Secondary | ICD-10-CM | POA: Insufficient documentation

## 2015-10-24 DIAGNOSIS — E871 Hypo-osmolality and hyponatremia: Secondary | ICD-10-CM | POA: Diagnosis not present

## 2015-10-24 DIAGNOSIS — Z7982 Long term (current) use of aspirin: Secondary | ICD-10-CM | POA: Diagnosis not present

## 2015-10-24 DIAGNOSIS — Z87891 Personal history of nicotine dependence: Secondary | ICD-10-CM | POA: Diagnosis not present

## 2015-10-24 DIAGNOSIS — I10 Essential (primary) hypertension: Secondary | ICD-10-CM | POA: Diagnosis not present

## 2015-10-24 DIAGNOSIS — I480 Paroxysmal atrial fibrillation: Secondary | ICD-10-CM | POA: Diagnosis not present

## 2015-10-24 DIAGNOSIS — Z880 Allergy status to penicillin: Secondary | ICD-10-CM | POA: Insufficient documentation

## 2015-10-24 DIAGNOSIS — Z8701 Personal history of pneumonia (recurrent): Secondary | ICD-10-CM | POA: Insufficient documentation

## 2015-10-24 LAB — URINALYSIS, ROUTINE W REFLEX MICROSCOPIC
BILIRUBIN URINE: NEGATIVE
Glucose, UA: NEGATIVE mg/dL
Hgb urine dipstick: NEGATIVE
KETONES UR: NEGATIVE mg/dL
NITRITE: NEGATIVE
PH: 7 (ref 5.0–8.0)
Protein, ur: NEGATIVE mg/dL
SPECIFIC GRAVITY, URINE: 1.011 (ref 1.005–1.030)

## 2015-10-24 LAB — CBC
HEMATOCRIT: 30.5 % — AB (ref 36.0–46.0)
HEMOGLOBIN: 10.6 g/dL — AB (ref 12.0–15.0)
MCH: 34.6 pg — ABNORMAL HIGH (ref 26.0–34.0)
MCHC: 34.8 g/dL (ref 30.0–36.0)
MCV: 99.7 fL (ref 78.0–100.0)
Platelets: 263 10*3/uL (ref 150–400)
RBC: 3.06 MIL/uL — ABNORMAL LOW (ref 3.87–5.11)
RDW: 12.9 % (ref 11.5–15.5)
WBC: 5.4 10*3/uL (ref 4.0–10.5)

## 2015-10-24 LAB — COMPREHENSIVE METABOLIC PANEL
ALK PHOS: 38 U/L (ref 38–126)
ALT: 10 U/L — ABNORMAL LOW (ref 14–54)
ANION GAP: 8 (ref 5–15)
AST: 24 U/L (ref 15–41)
Albumin: 3.6 g/dL (ref 3.5–5.0)
BILIRUBIN TOTAL: 0.5 mg/dL (ref 0.3–1.2)
BUN: 30 mg/dL — ABNORMAL HIGH (ref 6–20)
CALCIUM: 8.5 mg/dL — AB (ref 8.9–10.3)
CO2: 28 mmol/L (ref 22–32)
Chloride: 91 mmol/L — ABNORMAL LOW (ref 101–111)
Creatinine, Ser: 1.18 mg/dL — ABNORMAL HIGH (ref 0.44–1.00)
GFR, EST AFRICAN AMERICAN: 44 mL/min — AB (ref >60)
GFR, EST NON AFRICAN AMERICAN: 38 mL/min — AB (ref >60)
GLUCOSE: 120 mg/dL — AB (ref 65–99)
POTASSIUM: 3.6 mmol/L (ref 3.5–5.1)
SODIUM: 127 mmol/L — AB (ref 135–145)
TOTAL PROTEIN: 6.5 g/dL (ref 6.5–8.1)

## 2015-10-24 LAB — URINE MICROSCOPIC-ADD ON: WBC UA: NONE SEEN WBC/hpf (ref 0–5)

## 2015-10-24 LAB — LIPASE, BLOOD: LIPASE: 27 U/L (ref 11–51)

## 2015-10-24 MED ORDER — SODIUM CHLORIDE 0.9 % IV BOLUS (SEPSIS)
250.0000 mL | Freq: Once | INTRAVENOUS | Status: AC
  Administered 2015-10-24: 250 mL via INTRAVENOUS

## 2015-10-24 MED ORDER — ONDANSETRON HCL 4 MG/2ML IJ SOLN
4.0000 mg | Freq: Once | INTRAMUSCULAR | Status: DC | PRN
Start: 2015-10-24 — End: 2015-10-24

## 2015-10-24 NOTE — ED Provider Notes (Signed)
CSN: [id number]     Arrival date & time 10/24/15  1326 History   First MD Initiated Contact with Patient 10/24/15 1401     Chief Complaint  Patient presents with  . Weakness      The history is provided by the patient and a relative. No language interpreter was used.    Stephanie Cochran is a 82 y.o. female who presents to the Emergency Department complaining of weakness/abnormal lab.  She was referred by her primary care provider for dehydration today. She was recently treated for pneumonia with antibiotics and was rechecked yesterday by her primary care doctor. She is noted to have a blood pressure of 90/40 and had labs drawn. She received a phone call today telling her that her labs were abnormal and she had to get IV fluids for dehydration. She reports that her cough and shortness of breath improved. She denies any fevers. She has no vomiting but she has nausea and decreased appetite and oral intake. Yesterday after starting to drink Pedialyte. She has good urinary output, no lower extremity edema. She lives at home alone. She was initially on doxycycline (for one day) but could not tolerate it and was switched to levaquin - last dose was yesterday.    Past Medical History  Diagnosis Date  . CAD (coronary artery disease)     s/p CABG 2007 at [facility]  . HTN (hypertension)   . PVD (peripheral vascular disease) ([place])   . Diverticulosis     history  . HLD (hyperlipidemia)   . Paroxysmal atrial fibrillation (H[facility])   . Atrial flutter ([place])     post operative  . Cellulitis     left forearm  . Aortic stenosis     a. s/p bioprosthetic AVR 2007 [facility]);  b. Echo (04/2009 - [facility] in [place], [place]):  Mild LVH, normal LVF, anterior WMA, AVR ok (mean 4 mmHg), MAC, mild MR, mild TV stenosis, RVSP 44 mmHg  . Breast cancer ([place])     left mastectomy  . Post-menopausal   . Arthritis   . Pacemaker 05/14/2014    dual chamber st jude   Dr Joylene Grapes  . Pneumonia     hx of pna  . Dysrhythmia 04/2014   second degree heart block  . Hx of echocardiogram     Echo (8/15):  Mild focal basal septal hypertrophy, EF 60-65%, no RWMA, AVR ok (mean 8 mmHg), MAC, mild MS (mean 4 mmHg), mod MR, mild LAE, PASP 54 mmHg   Past Surgical History  Procedure Laterality Date  . Renal artery stent  2006, 2010  . Aortic valve replacement  2007    with 45mm [facility] pericardial tissue valve   . Coronary artery bypass graft Left 2007    x3, left internal mammary artery to the left anterior descending, saphenous vein graft tot he circumflex coronary  . Arteriovenous graft placement w/ endoscopic vein harvest Left     left leg  . Mastectomy Left 2008    left, breast cancer, followed by a hematology oncologist  . Tonsillectomy  1943  . Hemorroidectomy    . Breast surgery    . Eye surgery    . Cardiac valve replacement    . Pacemaker insertion  05/14/2014    SJM Assurity DR implanted by Dr Rayann Heman for complete heart block  . Permanent pacemaker insertion N/A 05/14/2014    Procedure: PERMANENT PACEMAKER INSERTION;  Surgeon: Coralyn Mark, MD;  Location: [facility] CATH LAB;  Service:  Cardiovascular;  Laterality: N/A;   Family History  Problem Relation Age of Onset  . Colon cancer Sister   . Diabetes    . Heart disease    . Arthritis     Social History  Substance Use Topics  . Smoking status: Former Smoker    Quit date: 09/27/1960  . Smokeless tobacco: Never Used  . Alcohol Use: No   OB History    No data available     Review of Systems  All other systems reviewed and are negative.     Allergies  Codeine; Penicillins; and Vicodin  Home Medications   Prior to Admission medications   Medication Sig Start Date End Date Taking? Authorizing Provider  amLODipine (NORVASC) 10 MG tablet Take 1 tablet (10 mg total) by mouth daily. 09/03/15   Dorothy Spark, MD  aspirin EC 81 MG tablet Take 81 mg by mouth daily.    Historical Provider, MD  carvedilol (COREG) 6.25 MG tablet Take 1 tablet (6.25 mg  total) by mouth daily. 09/03/15   Dorothy Spark, MD  Cholecalciferol (VITAMIN D) 2000 UNITS tablet Take 2,000 Units by mouth daily.    Historical Provider, MD  clopidogrel (PLAVIX) 75 MG tablet Take 1 tablet (75 mg total) by mouth daily. 09/03/15   Dorothy Spark, MD  Cyanocobalamin (B-12) 1000 MCG CAPS Take 1,000 mcg by mouth daily.     Historical Provider, MD  diphenhydramine-acetaminophen (TYLENOL PM) 25-500 MG TABS Take 0.5 tablets by mouth at bedtime as needed (sleep).     Historical Provider, MD  letrozole (FEMARA) 2.5 MG tablet Take 1 tablet (2.5 mg total) by mouth daily. 10/03/14   Dorothy Spark, MD  olmesartan-hydrochlorothiazide (BENICAR HCT) 20-12.5 MG tablet Take 1 tablet by mouth daily. 09/03/15   Dorothy Spark, MD  Polyethyl Glycol-Propyl Glycol (SYSTANE OP) Place 1 drop into both eyes 2 (two) times daily as needed (dry eyes).    Historical Provider, MD  rosuvastatin (CRESTOR) 5 MG tablet Take 1 tablet (5 mg total) by mouth daily. 09/03/15   Dorothy Spark, MD  vitamin C (ASCORBIC ACID) 500 MG tablet Take 1,000 mg by mouth daily.    Historical Provider, MD  Vitamin D, Ergocalciferol, (DRISDOL) 50000 UNITS CAPS capsule Take 1 capsule (50,000 Units total) by mouth every 30 (thirty) days. On or about the 1st of each month 10/03/14   Dorothy Spark, MD  vitamin E 400 UNIT capsule Take 400 Units by mouth daily.    Historical Provider, MD   BP 115/44 mmHg  Pulse 66  Temp(Src) 97.5 F (36.4 C) (Oral)  Resp 20  Wt 119 lb (53.978 kg)  SpO2 97% Physical Exam  Constitutional: She is oriented to person, place, and time. She appears well-developed and well-nourished.  HENT:  Head: Normocephalic and atraumatic.  Cardiovascular: Normal rate and regular rhythm.   No murmur heard. Pulmonary/Chest: Effort normal and breath sounds normal. No respiratory distress.  Abdominal: Soft. There is no tenderness. There is no rebound and no guarding.  Musculoskeletal: She exhibits no edema  or tenderness.  Neurological: She is alert and oriented to person, place, and time.  Skin: Skin is warm and dry.  Psychiatric: She has a normal mood and affect. Her behavior is normal.  Nursing note and vitals reviewed.   ED Course  Procedures (including critical care time) Labs Review Labs Reviewed  COMPREHENSIVE METABOLIC PANEL - Abnormal; Notable for the following:    Sodium 127 (*)  Chloride 91 (*)    Glucose, Bld 120 (*)    BUN 30 (*)    Creatinine, Ser 1.18 (*)    Calcium 8.5 (*)    ALT 10 (*)    GFR calc non Af Amer 38 (*)    GFR calc Af Amer 44 (*)    All other components within normal limits  CBC - Abnormal; Notable for the following:    RBC 3.06 (*)    Hemoglobin 10.6 (*)    HCT 30.5 (*)    MCH 34.6 (*)    All other components within normal limits  LIPASE, BLOOD  URINALYSIS, ROUTINE W REFLEX MICROSCOPIC (NOT AT [facility])    Imaging Review Dg Chest 2 View  10/24/2015  CLINICAL DATA:  Dehydration, weakness, cough. EXAM: CHEST  2 VIEW COMPARISON:  10/13/2015 FINDINGS: Dual lead cardiac pacemaker, and postsurgical changes in the thorax from CABG and aortic valve replacement are stable. Cardiomediastinal silhouette is normal. Mediastinal contours appear intact. Bulky mitral valve annulus calcifications are seen. The aorta is torturous and contains atherosclerotic calcifications. There is no evidence of pleural effusion or pneumothorax. Small area of ground-glass opacity seen overlying the left upper lobe, stable. Osseous structures are without acute abnormality. Soft tissues are grossly normal. IMPRESSION: Stable small area of ground-glass opacity overlying the left upper lobe with uncertain significance. Atherosclerotic disease of the aorta. Bulky mitral valve annular calcifications, suggestive of mitral valve stenosis. No evidence of cardiomegaly. Electronically Signed   By: Fidela Salisbury M.D.   On: 10/24/2015 15:04   I have personally reviewed and evaluated these  images and lab results as part of my medical decision-making.   EKG Interpretation None      MDM   Final diagnoses:  Weakness  Hyponatremia    From PCP's office: Yest Cr 1.56, BUN 34, potassium 3.9, sodium 125. Hgb 10.6  Patient here for evaluation of weakness, hyponatremia, elevation and creatinine compared to baseline. Labs today are improved compared to yesterday and patient is feeling improved. Current clinical exam and chest x-ray are not consistent with acute pneumonia. Discussed with patient oral fluid hydration for dehydration at home, outpatient follow-up, return precautions.  Quintella Reichert, MD 10/24/15 [phone]

## 2015-10-24 NOTE — ED Notes (Signed)
Weakness. Hx of recent pneumonia. Her MD called her this am and told her lab work from yesterday shows dehydration.

## 2015-10-24 NOTE — Discharge Instructions (Signed)
Hyponatremia Hyponatremia is when the amount of salt (sodium) in your blood is too low. When sodium levels are low, your cells absorb extra water and they swell. The swelling happens throughout the body, but it mostly affects the brain. CAUSES This condition may be caused by:  Heart, kidney, or liver problems.  Thyroid problems.  Adrenal gland problems.  Metabolic conditions, such as syndrome of inappropriate antidiuretic hormone (SIADH).  Severe vomiting and diarrhea.  Certain medicines or illegal drugs.  Dehydration.  Drinking too much water.  Eating a diet that is low in sodium.  Large burns on your body.  Sweating. RISK FACTORS This condition is more likely to develop in people who:  Have long-term (chronic) kidney disease.  Have heart failure.  Have a medical condition that causes frequent or excessive diarrhea.  Have metabolic conditions, such as Addison disease or SIADH.  Take certain medicines that affect the sodium and fluid balance in the blood. Some of these medicine types include:  Diuretics.  NSAIDs.  Some opioid pain medicines.  Some antidepressants.  Some seizure prevention medicines. SYMPTOMS  Symptoms of this condition include:  Nausea and vomiting.  Confusion.  Lethargy.  Agitation.  Headache.  Seizures.  Unconsciousness.  Appetite loss.  Muscle weakness and cramping.  Feeling weak or light-headed.  Having a rapid heart rate.  Fainting, in severe cases. DIAGNOSIS This condition is diagnosed with a medical history and physical exam. You will also have other tests, including:  Blood tests.  Urine tests. TREATMENT Treatment for this condition depends on the cause. Treatment may include:  Fluids given through an IV tube that is inserted into one of your veins.  Medicines to correct the sodium imbalance. If medicines are causing the condition, the medicines will need to be adjusted.  Limiting water or fluid intake to  get the correct sodium balance. HOME CARE INSTRUCTIONS  Take medicines only as directed by your health care provider. Many medicines can make this condition worse. Talk with your health care provider about any medicines that you are currently taking.  Carefully follow a recommended diet as directed by your health care provider.  Carefully follow instructions from your health care provider about fluid restrictions.  Keep all follow-up visits as directed by your health care provider. This is important.  Do not drink alcohol. SEEK MEDICAL CARE IF:  You develop worsening nausea, fatigue, headache, confusion, or weakness.  Your symptoms go away and then return.  You have problems following the recommended diet. SEEK IMMEDIATE MEDICAL CARE IF:  You have a seizure.  You faint.  You have ongoing diarrhea or vomiting.   This information is not intended to replace advice given to you by your health care provider. Make sure you discuss any questions you have with your health care provider.   Document Released: 09/03/2002 Document Revised: 01/28/2015 Document Reviewed: 10/03/2014 Elsevier Interactive Patient Education 2016 [employer]   Weakness Weakness is a lack of strength. It may be felt all over the body (generalized) or in one specific part of the body (focal). Some causes of weakness can be serious. You may need further medical evaluation, especially if you are elderly or you have a history of immunosuppression (such as chemotherapy or HIV), kidney disease, heart disease, or diabetes. CAUSES  Weakness can be caused by many different things, including:  Infection.  Physical exhaustion.  Internal bleeding or other blood loss that results in a lack of red blood cells (anemia).  Dehydration. This cause is more common  in elderly people.  Side effects or electrolyte abnormalities from medicines, such as pain medicines or sedatives.  Emotional distress, anxiety, or  depression.  Circulation problems, especially severe peripheral arterial disease.  Heart disease, such as rapid atrial fibrillation, bradycardia, or heart failure.  Nervous system disorders, such as Guillain-Barr syndrome, multiple sclerosis, or stroke. DIAGNOSIS  To find the cause of your weakness, your caregiver will take your history and perform a physical exam. Lab tests or X-rays may also be ordered, if needed. TREATMENT  Treatment of weakness depends on the cause of your symptoms and can vary greatly. HOME CARE INSTRUCTIONS   Rest as needed.  Eat a well-balanced diet.  Try to get some exercise every day.  Only take over-the-counter or prescription medicines as directed by your caregiver. SEEK MEDICAL CARE IF:   Your weakness seems to be getting worse or spreads to other parts of your body.  You develop new aches or pains. SEEK IMMEDIATE MEDICAL CARE IF:   You cannot perform your normal daily activities, such as getting dressed and feeding yourself.  You cannot walk up and down stairs, or you feel exhausted when you do so.  You have shortness of breath or chest pain.  You have difficulty moving parts of your body.  You have weakness in only one area of the body or on only one side of the body.  You have a fever.  You have trouble speaking or swallowing.  You cannot control your bladder or bowel movements.  You have black or bloody vomit or stools. MAKE SURE YOU:  Understand these instructions.  Will watch your condition.  Will get help right away if you are not doing well or get worse.   This information is not intended to replace advice given to you by your health care provider. Make sure you discuss any questions you have with your health care provider.   Document Released: 09/13/2005 Document Revised: 03/14/2012 Document Reviewed: 11/12/2011 Elsevier Interactive Patient Education [employer].

## 2015-10-26 LAB — URINE CULTURE

## 2015-11-23 ENCOUNTER — Emergency Department (HOSPITAL_BASED_OUTPATIENT_CLINIC_OR_DEPARTMENT_OTHER)
Admission: EM | Admit: 2015-11-23 | Discharge: 2015-11-23 | Disposition: A | Discharge status: Home / Self Care | Payer: Medicare Other | Attending: Emergency Medicine | Admitting: Emergency Medicine

## 2015-11-23 ENCOUNTER — Emergency Department (HOSPITAL_BASED_OUTPATIENT_CLINIC_OR_DEPARTMENT_OTHER): Payer: Medicare Other

## 2015-11-23 ENCOUNTER — Encounter (HOSPITAL_BASED_OUTPATIENT_CLINIC_OR_DEPARTMENT_OTHER): Payer: Self-pay | Admitting: *Deleted

## 2015-11-23 DIAGNOSIS — Z79899 Other long term (current) drug therapy: Secondary | ICD-10-CM | POA: Insufficient documentation

## 2015-11-23 DIAGNOSIS — Z8701 Personal history of pneumonia (recurrent): Secondary | ICD-10-CM | POA: Insufficient documentation

## 2015-11-23 DIAGNOSIS — Z880 Allergy status to penicillin: Secondary | ICD-10-CM | POA: Diagnosis not present

## 2015-11-23 DIAGNOSIS — Z951 Presence of aortocoronary bypass graft: Secondary | ICD-10-CM | POA: Insufficient documentation

## 2015-11-23 DIAGNOSIS — Z872 Personal history of diseases of the skin and subcutaneous tissue: Secondary | ICD-10-CM | POA: Diagnosis not present

## 2015-11-23 DIAGNOSIS — I480 Paroxysmal atrial fibrillation: Secondary | ICD-10-CM | POA: Diagnosis not present

## 2015-11-23 DIAGNOSIS — Y9389 Activity, other specified: Secondary | ICD-10-CM | POA: Insufficient documentation

## 2015-11-23 DIAGNOSIS — Z853 Personal history of malignant neoplasm of breast: Secondary | ICD-10-CM | POA: Insufficient documentation

## 2015-11-23 DIAGNOSIS — I4892 Unspecified atrial flutter: Secondary | ICD-10-CM | POA: Diagnosis not present

## 2015-11-23 DIAGNOSIS — E785 Hyperlipidemia, unspecified: Secondary | ICD-10-CM | POA: Diagnosis not present

## 2015-11-23 DIAGNOSIS — Z23 Encounter for immunization: Secondary | ICD-10-CM | POA: Insufficient documentation

## 2015-11-23 DIAGNOSIS — Y92002 Bathroom of unspecified non-institutional (private) residence single-family (private) house as the place of occurrence of the external cause: Secondary | ICD-10-CM | POA: Diagnosis not present

## 2015-11-23 DIAGNOSIS — I10 Essential (primary) hypertension: Secondary | ICD-10-CM | POA: Insufficient documentation

## 2015-11-23 DIAGNOSIS — Z87891 Personal history of nicotine dependence: Secondary | ICD-10-CM | POA: Diagnosis not present

## 2015-11-23 DIAGNOSIS — Y998 Other external cause status: Secondary | ICD-10-CM | POA: Diagnosis not present

## 2015-11-23 DIAGNOSIS — S41112D Laceration without foreign body of left upper arm, subsequent encounter: Secondary | ICD-10-CM

## 2015-11-23 DIAGNOSIS — W19XXXA Unspecified fall, initial encounter: Secondary | ICD-10-CM

## 2015-11-23 DIAGNOSIS — Z8719 Personal history of other diseases of the digestive system: Secondary | ICD-10-CM | POA: Diagnosis not present

## 2015-11-23 DIAGNOSIS — Z7982 Long term (current) use of aspirin: Secondary | ICD-10-CM | POA: Insufficient documentation

## 2015-11-23 DIAGNOSIS — S4992XA Unspecified injury of left shoulder and upper arm, initial encounter: Secondary | ICD-10-CM | POA: Insufficient documentation

## 2015-11-23 DIAGNOSIS — I2510 Atherosclerotic heart disease of native coronary artery without angina pectoris: Secondary | ICD-10-CM | POA: Insufficient documentation

## 2015-11-23 DIAGNOSIS — Z950 Presence of cardiac pacemaker: Secondary | ICD-10-CM | POA: Insufficient documentation

## 2015-11-23 DIAGNOSIS — W01198A Fall on same level from slipping, tripping and stumbling with subsequent striking against other object, initial encounter: Secondary | ICD-10-CM | POA: Insufficient documentation

## 2015-11-23 DIAGNOSIS — M79602 Pain in left arm: Secondary | ICD-10-CM

## 2015-11-23 MED ORDER — TETANUS-DIPHTH-ACELL PERTUSSIS 5-2.5-18.5 LF-MCG/0.5 IM SUSP
0.5000 mL | Freq: Once | INTRAMUSCULAR | Status: AC
  Administered 2015-11-23: 0.5 mL via INTRAMUSCULAR
  Filled 2015-11-23: qty 0.5

## 2015-11-23 NOTE — ED Provider Notes (Signed)
CSN: [id number]     Arrival date & time 11/23/15  1449 History   First MD Initiated Contact with Patient 11/23/15 1553     Chief Complaint  Patient presents with  . Fall    Stephanie Cochran is a 82 y.o. female who presents to the ED from home after fall today. The patient reports she walked into the bathroom and was going to sit on the toilet when she accidentally missed the toilet and fell to the side. She reports hitting her left upper arm. She denies hitting her head or loss of consciousness. She denies feeling dizzy or off balance. She is laughing in the room about how she actually missed sitting on the toilet. She complains only of pain to her left upper arm. She is unsure when her last tetanus shot was. Patient reports some chronic pain to her left shoulder that has not worsened or changed. She has a rotator cuff injury previously. She denies any pain to her elbow or wrist. Patient denies fevers, headache, changes to her vision, lightheadedness, dizziness, syncope, chest pain, shortness of breath, abdominal pain, nausea, vomiting, diarrhea, numbness, tingling, weakness, or urinary symptoms.   Patient is a 82 y.o. female presenting with fall. The history is provided by the patient and a relative. No language interpreter was used.  Fall Associated symptoms include arthralgias. Pertinent negatives include no abdominal pain, chest pain, chills, congestion, coughing, fever, headaches, nausea, neck pain, numbness, rash, sore throat, vomiting or weakness.    Past Medical History  Diagnosis Date  . CAD (coronary artery disease)     s/p CABG 2007 at [facility]  . HTN (hypertension)   . PVD (peripheral vascular disease) ([place])   . Diverticulosis     history  . HLD (hyperlipidemia)   . Paroxysmal atrial fibrillation (HCC)   . Atrial flutter ([place])     post operative  . Cellulitis     left forearm  . Aortic stenosis     a. s/p bioprosthetic AVR 2007 [facility]);  b. Echo (04/2009 - [facility] in  [place], [place]):  Mild LVH, normal LVF, anterior WMA, AVR ok (mean 4 mmHg), MAC, mild MR, mild TV stenosis, RVSP 44 mmHg  . Breast cancer ([place])     left mastectomy  . Post-menopausal   . Arthritis   . Pacemaker 05/14/2014    dual chamber st jude   Dr Joylene Grapes  . Pneumonia     hx of pna  . Dysrhythmia 04/2014    second degree heart block  . Hx of echocardiogram     Echo (8/15):  Mild focal basal septal hypertrophy, EF 60-65%, no RWMA, AVR ok (mean 8 mmHg), MAC, mild MS (mean 4 mmHg), mod MR, mild LAE, PASP 54 mmHg   Past Surgical History  Procedure Laterality Date  . Renal artery stent  2006, 2010  . Aortic valve replacement  2007    with 26mm [facility] pericardial tissue valve   . Coronary artery bypass graft Left 2007    x3, left internal mammary artery to the left anterior descending, saphenous vein graft tot he circumflex coronary  . Arteriovenous graft placement w/ endoscopic vein harvest Left     left leg  . Mastectomy Left 2008    left, breast cancer, followed by a hematology oncologist  . Tonsillectomy  1943  . Hemorroidectomy    . Breast surgery    . Eye surgery    . Cardiac valve replacement    . Pacemaker insertion  05/14/2014    SJM Assurity DR implanted by Dr Rayann Heman for complete heart block  . Permanent pacemaker insertion N/A 05/14/2014    Procedure: PERMANENT PACEMAKER INSERTION;  Surgeon: Coralyn Mark, MD;  Location: [place] CATH LAB;  Service: Cardiovascular;  Laterality: N/A;   Family History  Problem Relation Age of Onset  . Colon cancer Sister   . Diabetes    . Heart disease    . Arthritis     Social History  Substance Use Topics  . Smoking status: Former Smoker    Quit date: 09/27/1960  . Smokeless tobacco: Never Used  . Alcohol Use: No   OB History    No data available     Review of Systems  Constitutional: Negative for fever and chills.  HENT: Negative for congestion and sore throat.   Eyes: Negative for visual disturbance.  Respiratory: Negative for  cough, shortness of breath and wheezing.   Cardiovascular: Negative for chest pain.  Gastrointestinal: Negative for nausea, vomiting, abdominal pain and diarrhea.  Genitourinary: Negative for dysuria.  Musculoskeletal: Positive for arthralgias. Negative for back pain, neck pain and neck stiffness.  Skin: Positive for wound. Negative for rash.  Neurological: Negative for syncope, weakness, light-headedness, numbness and headaches.      Allergies  Codeine; Penicillins; and Vicodin  Home Medications   Prior to Admission medications   Medication Sig Start Date End Date Taking? Authorizing Provider  amLODipine (NORVASC) 10 MG tablet Take 1 tablet (10 mg total) by mouth daily. 09/03/15   Dorothy Spark, MD  aspirin EC 81 MG tablet Take 81 mg by mouth daily.    Historical Provider, MD  carvedilol (COREG) 6.25 MG tablet Take 1 tablet (6.25 mg total) by mouth daily. 09/03/15   Dorothy Spark, MD  Cholecalciferol (VITAMIN D) 2000 UNITS tablet Take 2,000 Units by mouth daily.    Historical Provider, MD  clopidogrel (PLAVIX) 75 MG tablet Take 1 tablet (75 mg total) by mouth daily. 09/03/15   Dorothy Spark, MD  Cyanocobalamin (B-12) 1000 MCG CAPS Take 1,000 mcg by mouth daily.     Historical Provider, MD  diphenhydramine-acetaminophen (TYLENOL PM) 25-500 MG TABS Take 0.5 tablets by mouth at bedtime as needed (sleep).     Historical Provider, MD  letrozole (FEMARA) 2.5 MG tablet Take 1 tablet (2.5 mg total) by mouth daily. 10/03/14   Dorothy Spark, MD  olmesartan-hydrochlorothiazide (BENICAR HCT) 20-12.5 MG tablet Take 1 tablet by mouth daily. 09/03/15   Dorothy Spark, MD  Polyethyl Glycol-Propyl Glycol (SYSTANE OP) Place 1 drop into both eyes 2 (two) times daily as needed (dry eyes).    Historical Provider, MD  rosuvastatin (CRESTOR) 5 MG tablet Take 1 tablet (5 mg total) by mouth daily. 09/03/15   Dorothy Spark, MD  vitamin C (ASCORBIC ACID) 500 MG tablet Take 1,000 mg by mouth daily.     Historical Provider, MD  Vitamin D, Ergocalciferol, (DRISDOL) 50000 UNITS CAPS capsule Take 1 capsule (50,000 Units total) by mouth every 30 (thirty) days. On or about the 1st of each month 10/03/14   Dorothy Spark, MD  vitamin E 400 UNIT capsule Take 400 Units by mouth daily.    Historical Provider, MD   BP 151/71 mmHg  Pulse 60  Temp(Src) 98.2 F (36.8 C) (Oral)  Resp 16  Ht 5' 1.5" (1.562 m)  Wt 52.617 kg  BMI 21.57 kg/m2  SpO2 100% Physical Exam  Constitutional: She is oriented to person, place,  and time. She appears well-developed and well-nourished. No distress.  Nontoxic appearing. Very pleasant and talkative 82 year old female.  HENT:  Head: Normocephalic and atraumatic.  Right Ear: External ear normal.  Left Ear: External ear normal.  Mouth/Throat: Oropharynx is clear and moist.  No visible signs of head trauma.  Eyes: Conjunctivae and EOM are normal. Pupils are equal, round, and reactive to light. Right eye exhibits no discharge. Left eye exhibits no discharge.  Neck: Normal range of motion. Neck supple. No JVD present. No tracheal deviation present.  No midline neck tenderness.  Cardiovascular: Normal rate, regular rhythm, normal heart sounds and intact distal pulses.  Exam reveals no gallop and no friction rub.   No murmur heard. Bilateral radial and posterior tibialis pulses are intact. Good capillary refill to her bilateral distal fingertips.  Pulmonary/Chest: Effort normal and breath sounds normal. No respiratory distress. She has no wheezes. She has no rales. She exhibits no tenderness.  Lungs are clear to auscultation bilaterally. No chest wall tenderness to palpation. Symmetric chest expansion bilaterally. No crepitus.  Abdominal: Soft. Bowel sounds are normal. She exhibits no distension. There is no tenderness. There is no guarding.  Abdomen is soft and nontender to palpation.  Musculoskeletal: Normal range of motion. She exhibits tenderness. She exhibits no  edema.  Patient has some mild tenderness to her left upper arm where there are several skin tears. No lacerations. Bleeding is controlled with gauze. Patient has full range of motion of her bilateral elbows and wrists. Patient has some decreased range of motion of her left shoulder which she ports is chronic due to pain after a former rotator cuff injury. She denies any worsening pain. No midline neck or back tenderness. Patient is spontaneously moving her upper and lower extremities and exhibiting good strength. She is able to ambulate to the bathroom with some assistance. No pelvic instability. No pain with manipulation of her lower legs at her head.  Lymphadenopathy:    She has no cervical adenopathy.  Neurological: She is alert and oriented to person, place, and time. No cranial nerve deficit. Coordination normal.  The patient is alert and oriented 3. Cranial nerves are intact. Sensation is intact to bilateral upper marks remedies. Vision is grossly intact. Speech is clear and coherent.  Skin: Skin is warm and dry. No rash noted. She is not diaphoretic. No erythema. No pallor.  Psychiatric: She has a normal mood and affect. Her behavior is normal.  Nursing note and vitals reviewed.   ED Course  Procedures (including critical care time) Labs Review Labs Reviewed - No data to display  Imaging Review Dg Humerus Left  11/23/2015  CLINICAL DATA:  Patient status post fall with left humerus injury. Limited range of motion. Initial encounter. EXAM: LEFT HUMERUS - 2+ VIEW COMPARISON:  Shoulder radiograph 09/30/2015 FINDINGS: Normal anatomic alignment. No evidence for acute fracture dislocation. High-riding humeral head compatible with rotator cuff injury. IMPRESSION: No acute osseous abnormality. Electronically Signed   By: Lovey Newcomer M.D.   On: 11/23/2015 16:41   I have personally reviewed and evaluated these images as part of my medical decision-making.   EKG Interpretation None      Filed  Vitals:   11/23/15 1514  BP: 151/71  Pulse: 60  Temp: 98.2 F (36.8 C)  TempSrc: Oral  Resp: 16  Height: 5' 1.5" (1.562 m)  Weight: 52.617 kg  SpO2: 100%     MDM   Meds given in ED:  Medications  Tdap (BOOSTRIX) injection  0.5 mL (0.5 mLs Intramuscular Given 11/23/15 1707)    New Prescriptions   No medications on file    Final diagnoses:  Fall, initial encounter  Left arm pain  Skin tear of upper arm without complication, left, subsequent encounter   This is a 82 y.o. female who presents to the ED from home after fall today. The patient reports she walked into the bathroom and was going to sit on the toilet when she accidentally missed the toilet and fell to the side. She reports hitting her left upper arm. She denies hitting her head or loss of consciousness. She denies feeling dizzy or off balance. She is laughing in the room about how she actually missed sitting on the toilet. She complains only of pain to her left upper arm. She is unsure when her last tetanus shot was. Patient reports some chronic pain to her left shoulder that has not worsened or changed. She has a rotator cuff injury previously. She denies any pain to her elbow or wrist.  On exam the patient is afebrile nontoxic appearing. She has no focal neurological deficits. She is alert and oriented 3. She has several skin tears noted to her left upper arm. She has good range of motion of her upper extremities. No other bony point tenderness. X-ray of her left humerus is unremarkable. Will update tetanus shot today. Patient is well-appearing and talkative. Patient had slip and fall today. Will discharge with close follow-up by primary care. Patient provided with wound care instructions and strict and specific return precautions. Will discharge to care of family. I advised the patient to follow-up with their primary care provider this week. I advised the patient to return to the emergency department with new or worsening  symptoms or new concerns. The patient verbalized understanding and agreement with plan.   This patient was discussed with Dr. Ralene Bathe who agrees with assessment and plan.    Waynetta Pean, PA-C 11/23/15 1726  Quintella Reichert, MD 11/24/15 1328

## 2015-11-23 NOTE — Discharge Instructions (Signed)
Skin Tear Care A skin tear is a wound in which the top layer of skin has peeled off. This is a common problem with aging because the skin becomes thinner and more fragile as a person gets older. In addition, some medicines, such as oral corticosteroids, can lead to skin thinning if taken for long periods of time.  A skin tear is often repaired with tape or skin adhesive strips. This keeps the skin that has been peeled off in contact with the healthier skin beneath. Depending on the location of the wound, a bandage (dressing) may be applied over the tape or skin adhesive strips. Sometimes, during the healing process, the skin turns black and dies. Even when this happens, the torn skin acts as a good dressing until the skin underneath gets healthier and repairs itself. HOME CARE INSTRUCTIONS   Change dressings once per day or as directed by your caregiver.  Gently clean the skin tear and the area around the tear using saline solution or mild soap and water.  Do not rub the injured skin dry. Let the area air dry.  Apply petroleum jelly or an antibiotic cream or ointment to keep the tear moist. This will help the wound heal. Do not allow a scab to form.  If the dressing sticks before the next dressing change, moisten it with warm soapy water and gently remove it.  Protect the injured skin until it has healed.  Only take over-the-counter or prescription medicines as directed by your caregiver.  Take showers or baths using warm soapy water. Apply a new dressing after the shower or bath.  Keep all follow-up appointments as directed by your caregiver.  SEEK IMMEDIATE MEDICAL CARE IF:   You have redness, swelling, or increasing pain in the skin tear.  You havepus coming from the skin tear.  You have chills.  You have a red streak that goes away from the skin tear.  You have a bad smell coming from the tear or dressing.  You have a fever or persistent symptoms for more than 2-3 days.  You  have a fever and your symptoms suddenly get worse. MAKE SURE YOU:  Understand these instructions.  Will watch this condition.  Will get help right away if your child is not doing well or gets worse.   This information is not intended to replace advice given to you by your health care provider. Make sure you discuss any questions you have with your health care provider.   Document Released: 06/08/2001 Document Revised: 06/07/2012 Document Reviewed: 03/27/2012 Elsevier Interactive Patient Education 2016 Elsevier Inc.  Musculoskeletal Pain Musculoskeletal pain is muscle and boney aches and pains. These pains can occur in any part of the body. Your caregiver may treat you without knowing the cause of the pain. They may treat you if blood or urine tests, X-rays, and other tests were normal.  CAUSES There is often not a definite cause or reason for these pains. These pains may be caused by a type of germ (virus). The discomfort may also come from overuse. Overuse includes working out too hard when your body is not fit. Boney aches also come from weather changes. Bone is sensitive to atmospheric pressure changes. HOME CARE INSTRUCTIONS   Ask when your test results will be ready. Make sure you get your test results.  Only take over-the-counter or prescription medicines for pain, discomfort, or fever as directed by your caregiver. If you were given medications for your condition, do not drive, operate  machinery or power tools, or sign legal documents for 24 hours. Do not drink alcohol. Do not take sleeping pills or other medications that may interfere with treatment.  Continue all activities unless the activities cause more pain. When the pain lessens, slowly resume normal activities. Gradually increase the intensity and duration of the activities or exercise.  During periods of severe pain, bed rest may be helpful. Lay or sit in any position that is comfortable.  Putting ice on the injured  area.  Put ice in a bag.  Place a towel between your skin and the bag.  Leave the ice on for 15 to 20 minutes, 3 to 4 times a day.  Follow up with your caregiver for continued problems and no reason can be found for the pain. If the pain becomes worse or does not go away, it may be necessary to repeat tests or do additional testing. Your caregiver may need to look further for a possible cause. SEEK IMMEDIATE MEDICAL CARE IF:  You have pain that is getting worse and is not relieved by medications.  You develop chest pain that is associated with shortness or breath, sweating, feeling sick to your stomach (nauseous), or throw up (vomit).  Your pain becomes localized to the abdomen.  You develop any new symptoms that seem different or that concern you. MAKE SURE YOU:   Understand these instructions.  Will watch your condition.  Will get help right away if you are not doing well or get worse.   This information is not intended to replace advice given to you by your health care provider. Make sure you discuss any questions you have with your health care provider.   Document Released: 09/13/2005 Document Revised: 12/06/2011 Document Reviewed: 05/18/2013 Elsevier Interactive Patient Education [employer].

## 2015-11-23 NOTE — ED Notes (Signed)
Pt fell in the bathroom today.  Pt noted to have multiple skin tears on left upper arm.  Bleeding controlled.  Pt is able to move arm, reports pain that is normal from previous rotator cuff surgery.  There is significant swelling and tenderness over upper arm around skin tear.  Radial pulses 2+.

## 2015-12-03 ENCOUNTER — Ambulatory Visit (INDEPENDENT_AMBULATORY_CARE_PROVIDER_SITE_OTHER): Payer: Medicare Other | Admitting: *Deleted

## 2015-12-03 DIAGNOSIS — I441 Atrioventricular block, second degree: Secondary | ICD-10-CM | POA: Diagnosis not present

## 2015-12-05 NOTE — Progress Notes (Signed)
Remote pacemaker transmission.   

## 2015-12-06 LAB — CUP PACEART REMOTE DEVICE CHECK
Brady Statistic AP VP Percent: 77 %
Brady Statistic AP VS Percent: 1 %
Brady Statistic AS VP Percent: 23 %
Brady Statistic RA Percent Paced: 75 %
Brady Statistic RV Percent Paced: 99 %
Implantable Lead Implant Date: 20150818
Implantable Lead Model: 1948
Lead Channel Impedance Value: 360 Ohm
Lead Channel Impedance Value: 560 Ohm
Lead Channel Pacing Threshold Amplitude: 0.625 V
Lead Channel Sensing Intrinsic Amplitude: 4.2 mV
Lead Channel Setting Pacing Amplitude: 2 V
Lead Channel Setting Pacing Pulse Width: 0.5 ms
Lead Channel Setting Sensing Sensitivity: 3 mV
MDC IDC LEAD IMPLANT DT: 20150818
MDC IDC LEAD LOCATION: 753859
MDC IDC LEAD LOCATION: 753860
MDC IDC MSMT BATTERY REMAINING LONGEVITY: 113 mo
MDC IDC MSMT BATTERY REMAINING PERCENTAGE: 95.5 %
MDC IDC MSMT BATTERY VOLTAGE: 3.01 V
MDC IDC MSMT LEADCHNL RA PACING THRESHOLD AMPLITUDE: 1 V
MDC IDC MSMT LEADCHNL RA PACING THRESHOLD PULSEWIDTH: 0.5 ms
MDC IDC MSMT LEADCHNL RA SENSING INTR AMPL: 2.4 mV
MDC IDC MSMT LEADCHNL RV PACING THRESHOLD PULSEWIDTH: 0.5 ms
MDC IDC SESS DTM: 20170308090015
MDC IDC SET LEADCHNL RV PACING AMPLITUDE: 0.875
MDC IDC STAT BRADY AS VS PERCENT: 1 %
Pulse Gen Model: 2240
Pulse Gen Serial Number: 7663995

## 2015-12-06 NOTE — Progress Notes (Signed)
Normal remote reviewed. AF burden 1.4%, longest episode <12 hours. Per last note, no OAC until burden increases.   Next [name] 03/03/16

## 2015-12-09 ENCOUNTER — Other Ambulatory Visit: Payer: Self-pay

## 2015-12-09 VITALS — BP 120/80 | Ht 62.0 in | Wt 116.0 lb

## 2015-12-09 DIAGNOSIS — I5040 Unspecified combined systolic (congestive) and diastolic (congestive) heart failure: Secondary | ICD-10-CM

## 2015-12-09 DIAGNOSIS — Z9181 History of falling: Secondary | ICD-10-CM | POA: Insufficient documentation

## 2015-12-09 DIAGNOSIS — I509 Heart failure, unspecified: Secondary | ICD-10-CM | POA: Insufficient documentation

## 2015-12-09 NOTE — Patient Outreach (Addendum)
[facility]) Care Management  12/09/2015  GARLENE AUGSBURGER 1917/10/19 [id number]   Referral Date:  12/04/2015 Source:  Dr. Amedeo Gory Issue:  Patient lives alone, fall risk, fallen recently, interested in meals on wheels. Patient desires to remain independent as long as possible in her home.  Disease Management: CHF.   Providers: Primary MD: Dr.Francis Jacelyn Grip / Dr. Addison Lank  -  last appt: 12/04/2015    next appt:  12/25/2015 Cardiologist:  Dr. Meda Coffee HH: none  Insurance:  Medicare BCBS government plan.   Social: Patient lives in her home alone.  Mobility: Ambulates with walker or cane.  Falls: fell while in the bathroom about 2 weeks ago with large laceration to left upper arm. Neighbor/nurse is coming ovr to provide dressing changes.  Pain: no Transportation:  Family, neighbors Caregiver: good neighbors; 2 are nurses.  Resources: -patient no longer cooks and interested in meals on wheels.  DME: walker, cane, scales   [facility] conditions:  CHF  Patient has a Pacemaker Admissions: 0 ER visits: 2 Weighing about every 2-3 days.  Ensure 2 per day.   Medications:  Patient taking less than 10 medications Co-pay cost issues: no  Flu Vaccine: 06/09/15 Pneumonia Vaccine: coughing on 12/04/15 MD appt and patient requested to hold off.  Using Medication box Mail order order 90 day supplies.   Current Medications:  Current Outpatient Prescriptions  Medication Sig Dispense Refill  . amLODipine (NORVASC) 10 MG tablet Take 1 tablet (10 mg total) by mouth daily. 90 tablet 3  . aspirin EC 81 MG tablet Take 81 mg by mouth daily.    . carvedilol (COREG) 6.25 MG tablet Take 1 tablet (6.25 mg total) by mouth daily. 90 tablet 3  . Cholecalciferol (VITAMIN D) 2000 UNITS tablet Take 2,000 Units by mouth daily.    . clopidogrel (PLAVIX) 75 MG tablet Take 1 tablet (75 mg total) by mouth daily. 90 tablet 3  . Cyanocobalamin (B-12) 1000 MCG CAPS Take 1,000 mcg by mouth daily.     .  diphenhydramine-acetaminophen (TYLENOL PM) 25-500 MG TABS Take 0.5 tablets by mouth at bedtime as needed (sleep).     Marland Kitchen letrozole (FEMARA) 2.5 MG tablet Take 1 tablet (2.5 mg total) by mouth daily. 90 tablet 1  . olmesartan-hydrochlorothiazide (BENICAR HCT) 20-12.5 MG tablet Take 1 tablet by mouth daily. 90 tablet 3  . Polyethyl Glycol-Propyl Glycol (SYSTANE OP) Place 1 drop into both eyes 2 (two) times daily as needed (dry eyes).    . rosuvastatin (CRESTOR) 5 MG tablet Take 1 tablet (5 mg total) by mouth daily. 90 tablet 3  . vitamin C (ASCORBIC ACID) 500 MG tablet Take 1,000 mg by mouth daily.    . Vitamin D, Ergocalciferol, (DRISDOL) 50000 UNITS CAPS capsule Take 1 capsule (50,000 Units total) by mouth every 30 (thirty) days. On or about the 1st of each month 6 capsule 0  . vitamin E 400 UNIT capsule Take 400 Units by mouth daily.     No current facility-administered medications for this visit.    Fall/Depression Screening: Depression screen [facility] 2/9 12/09/2015  Decreased Interest 0  Down, Depressed, Hopeless 0  PHQ - 2 Score 0    Fall Risk  12/09/2015  Falls in the past year? Yes  Number falls in past yr: 1  Injury with Fall? Yes  Risk Factor Category  High Fall Risk  Risk for fall due to : History of fall(s);Impaired balance/gait;Impaired mobility  Follow up Falls evaluation completed;Education provided;Falls prevention  discussed;Follow up appointment   Assessment: Consent: Patient agreed to [facility] services: Community RN CM and SW services.  Resources needed to meet patients goal of remaining in the home for care.  Patient nonadherent to CHF management with daily weights and BP checks.  At risk for falls with h/o falls.   Plan:  [place] RN CM Referral  -CHF education  -Fall risk but desires to remain in her home.   [facility] Social Work Referral -Psycho/Social assessment; patient desires to remain in her home (lives alone).  Please provide resources to help patient remain  independent and / or discuss options for care. -Meals on Wheels assistance with referral.    RN CM advised in next scheduled [facility] contact call within the next 10 business days.  RN CM advised to please notify MD of any changes in condition prior to scheduled appt's.   RN CM provided contact name and # [phone] or main office # [phone] and 24-hour nurse line # [phone].  RN CM confirmed patient is aware of 911 services for urgent emergency needs.  Mariann Laster, RN, BSN, [facility], CCM  [employer] Management Coordinator [phone] Direct [phone] Cell [phone] Office [phone] Fax

## 2015-12-10 ENCOUNTER — Encounter: Payer: Self-pay | Admitting: Cardiology

## 2015-12-12 ENCOUNTER — Other Ambulatory Visit: Payer: Self-pay | Admitting: *Deleted

## 2015-12-12 NOTE — Patient Outreach (Signed)
[facility]) Care Management  12/12/2015  Stephanie Cochran 09/21/1918 [id number]  CSW received referral on 12/10/2015 from  Mariann Laster, Telephonic Nurse Case Manager with [place] [facility], indicating that patient would benefit from social work services and resources to assist with obtaining Stephanie Cochran through [employer] [facility] [place]. CSW was able to make initial contact with patient today to perform phone assessment, as well as assess and assist with social work needs and services.  CSW introduced self, explained role and types of services provided through [place] [facility] ([place] [facility]).  CSW further explained to patient that CSW works with patient's RNCM, also with [place] [facility], Crystal Hutchinson. CSW then explained the reason for the call, indicating that [name] thought that patient would benefit from social work services and resources to assist with obtaining Stephanie Cochran through [employer] [facility] [place].  CSW obtained two HIPAA compliant identifiers from patient, which included patient's name and date of birth. In speaking with patient, patient admitted to being interested in Stephanie Cochran, agreeable to allowing CSW made the appropriate referral for services.  Patient is currently receiving hot meals through niece, but does not wish to continue to be "a burden".  Patient indicated that she has her own vehicle and is able to transport herself to and from physician appointments.  Patient is able to afford medications and wishes to continue to remain at home, living independently, for as long as possible.  Patient has a Careers information officer that comes once per week to assist with light house keeping duties.  Patient is able to perform activities of daily living independently.  No additional social work needs identified.  Patient provided CSW's contact information and encouraged to  contact CSW if social work needs arise in the near future. CSW will perform a case closure on patient, as all goals of treatment have been met from social work standpoint and no additional social work needs have been identified at this time. CSW will notify patient's RNCM with [place] [facility], Odis Hollingshead of CSW's plans to close patient's case. CSW will fax a correspondence letter to patient's Primary Care Physician, Dr. Cari Caraway to ensure that Dr. Addison Lank is aware of CSW's case closure plans.   CSW will submit a case closure request to Lurline Del, Care Management Assistant with [place] [facility], in the form of an [employer].  CSW will ensure that [name] is aware of Crystal Hutchinson's, RNCM with [place] [facility], continued involvement with patient's care.  Nat Christen, BSW, MSW, LCSW  Licensed Education officer, environmental Health System  Mailing [place] [address] [place], [place] [address] Physical Address-[address], [place], [place] [address] Toll Free Main # [phone] Fax # [phone] Cell # [phone]  Fax # [phone]  [email or web address]  Patient's preferred language:  [place]   English  ATTENTION:  If you speak [place], language assistance services, free of charge, are available to you.    Nondiscrimination and Accessibility Statement: Discrimination is Against the [employer], a subsidiary of [employer], complies with [employer] civil rights laws and does not discriminate on the basis of race, color, national origin, age, disability, or sex.  [place] does not exclude people or treat them differently because of race, color, national origin, age, disability, or sex.  [place] Providers will:  .  Provide free aids and services to people with disabilities to  communicate effectively with [place], such as:     ? Qualified sign language interpreters  ? Written information in other formats (large print, audio, accessible electronic formats, other formats)   . Provide free language services to people whose primary language is not [place], such as:    ? Qualified interpreters    ? Information written in other languages   If you need these services, contact your [facility] psychologist.  If you believe that a [employer] has failed to provide these services or discriminated in another way on the basis of race, color, national origin, age, disability, or sex, you can file a Tourist information centre manager with: [place], [phone] or [email or web address].info/.  You can file a grievance in person or by mail, fax, or email. If you need help filing a grievance, you may contact Valrie Hart, Interim Compliance Officer, [facility] Department of Compliance and Integrity, [address], [place], [place]. [place], [phone], [email or web address].    You can also file a civil rights complaint with the U.S. Department of Health and Financial controller, Office for [employer], electronically through the Office for Civil Rights Complaint Portal, available at OnSiteLending.nl.jsf, or by mail or phone at:  [place]. Department of Health and Human Services [address], [place] Room [phone], [facility] [place], [place]  [phone], [phone] (TDD) Complaint forms are available at CutFunds.si.

## 2015-12-12 NOTE — Patient Outreach (Signed)
[facility]) Care Management  12/12/2015  ABBEE DOWDEN 06/22/1918 [id number]  Assessment: Care coordination Referral received from telephonic care management coordinator (C. Hutchinson) for congestive heart failure education and fall risk (desires to remain at home). Call placed and spoke with patient. Care management coordinator introduced self and explained purpose of the call.   Patient reports that she lives alone. She has neighbors that check and assist her with her needs. Patient has a niece who provides some assistance to her as well. According to patient, she has a private pay housekeeper that comes every 2 weeks to manage her housekeeping needs.  She reports walking around with a walker.  Medications reviewed with noted decrease in doses of her blood pressure medications (Benicar and Norvasc) apparently due to her low blood pressure readings. Patient shares that she continues to manage and takes her own medications. She states that her niece or neighbors provide transportation to her doctor's appointments although she still drives as stated.  Patient reports that she has a follow-up appointment with Dr. Jacelyn Grip on 12/25/15 at 4 pm. She mentions that her scheduled appointment with primary care provider (Dr. Addison Lank) is on 9/14.    Patient notified care management coordinator that her neighbors and grandson are eating dinner with her and asked to be contacted next time. Patient agreed to home visit next month.     Plan: Initial home visit on 12/29/15.   Tymeer Vaquera A. Armiyah Capron, BSN, RN-BC [place] Management Coordinator Cell: [phone]

## 2015-12-29 ENCOUNTER — Other Ambulatory Visit: Payer: Self-pay | Admitting: *Deleted

## 2015-12-29 ENCOUNTER — Encounter: Payer: Self-pay | Admitting: *Deleted

## 2015-12-29 NOTE — Patient Outreach (Addendum)
[facility]) Care Management   12/29/2015  Stephanie Cochran 03-Feb-1918 [id number]  Stephanie Cochran is an 81 y.o. female  Subjective: Patient reports "feeling good" after getting rid of cough and congestion. She reports ocassional shortness of breath on exertion. Patient verbalized "I would rather stay here at home than go somewhere else". She states she can get someone to help her with house chores and assist with her needs at home.  Objective: BP 126/54 mmHg  Pulse 65  Resp 18  SpO2 98%   Review of Systems  Constitutional: Negative.   HENT: Negative.   Eyes: Negative.        Wears eyeglasses  Respiratory: Positive for shortness of breath. Negative for cough, sputum production and wheezing.        Ocassional shortness of breath with exertion Respirations even and unlabored  Cardiovascular: Negative for chest pain.       Regular rate and rhythm Patient has pacemaker Trace swelling to bilateral legs  Gastrointestinal: Positive for heartburn and constipation. Negative for nausea, vomiting and abdominal pain.       Bowel sounds positive Abdomen soft, non-distended and non-tender  Genitourinary:       Wears pads for protection from urine leakage Absence of left breast related to previous surgery  Musculoskeletal: Positive for back pain and falls.       Kyphotic History of falls  Skin: Negative.        Bruise to left hand  Neurological: Negative.   Endo/Heme/Allergies: Bruises/bleeds easily.  Psychiatric/Behavioral: Positive for memory loss.    Physical Exam  Encounter Medications:   Outpatient Encounter Prescriptions as of 12/29/2015  Medication Sig Note  . amLODipine (NORVASC) 10 MG tablet Take 1 tablet (10 mg total) by mouth daily. 12/12/2015: Taking 2.5 mg once a day   . aspirin EC 81 MG tablet Take 81 mg by mouth daily.   . carvedilol (COREG) 6.25 MG tablet Take 1 tablet (6.25 mg total) by mouth daily.   . Cholecalciferol (VITAMIN D) 2000 UNITS tablet  Take 2,000 Units by mouth daily.   . clopidogrel (PLAVIX) 75 MG tablet Take 1 tablet (75 mg total) by mouth daily.   . Cyanocobalamin (B-12) 1000 MCG CAPS Take 1,000 mcg by mouth daily.    . diphenhydramine-acetaminophen (TYLENOL PM) 25-500 MG TABS Take 0.5 tablets by mouth at bedtime as needed (sleep).    . olmesartan-hydrochlorothiazide (BENICAR HCT) 20-12.5 MG tablet Take 1 tablet by mouth daily. 12/12/2015: Taking 5 mg daily  . Polyethyl Glycol-Propyl Glycol (SYSTANE OP) Place 1 drop into both eyes 2 (two) times daily as needed (dry eyes).   . rosuvastatin (CRESTOR) 5 MG tablet Take 1 tablet (5 mg total) by mouth daily.   . vitamin C (ASCORBIC ACID) 500 MG tablet Take 1,000 mg by mouth daily.   . Vitamin D, Ergocalciferol, (DRISDOL) 50000 UNITS CAPS capsule Take 1 capsule (50,000 Units total) by mouth every 30 (thirty) days. On or about the 1st of each month   . vitamin E 400 UNIT capsule Take 400 Units by mouth daily. Reported on 12/12/2015   . letrozole (FEMARA) 2.5 MG tablet Take 1 tablet (2.5 mg total) by mouth daily. (Patient not taking: Reported on 12/12/2015)    No facility-administered encounter medications on file as of 12/29/2015.    Functional Status:   In your present state of health, do you have any difficulty performing the following activities: 12/29/2015 12/12/2015  Hearing? - N  Vision? N -  Difficulty concentrating or making decisions? - Y  Walking or climbing stairs? - -  Dressing or bathing? - -  Doing errands, shopping? Y -  Conservation officer, nature and eating ? - -  Using the Toilet? - -  In the past six months, have you accidently leaked urine? Y -  Do you have problems with loss of bowel control? N -  Managing your Medications? N -  Managing your Finances? N -  Housekeeping or managing your Housekeeping? - -    Fall/Depression Screening:    PHQ 2/9 Scores 12/09/2015  PHQ - 2 Score 0    Assessment:   82 year old female being seen for congestive heart failure. Arrived at  patient's one level home. Patient and niece [name]) present during the visit. Patient denies cough, mucus, chest congestion/ tightness, weight gain and loss of appetite at present. She reports that her weight today is 116 pounds but does not record. [facility] calendar/ notebook provided and explained the importance of checking daily weights and recording [facility] heart failure packet and magnet provided to patient and discussed the different heart failure zones as well as signs and symptoms of heart failure flare-up and when to call the doctor for help. Booklet on Living life better with heart failure provided and explained the simple steps in managing heart failure. Rest periods encouraged to prevent fatigue and shortness of breath with physical exertion.  According to patient, she was seen by Dr. Jacelyn Grip on 3/30 and was instructed to increase sodium intake due to low sodium level at 30 with goal of 35. Patient was encouraged to monitor weight gain of 3 pounds in a day or 5 pounds in a week or increased swelling and report to provider if any.  Ms.Steinke was provided with Home Fall Prevention checklist for her guide to avoid fall episodes.   Patient reports still waiting for mobile meals to be in place and expressed interest for it. Encouraged to follow-up with [facility] social worker to notify that she is still in need of it. Patient and niece had mentioned of future plan for personal care services or home aide since patient desires to remain at home. Both were made aware to notify care management coordinator once they are ready for it, to be referred to [place] worker for assistance with this need.   Patient and niece were unable to identify any additional needs at this time. Patient agreed for home visit next month. Encouraged patient to call [facility] management coordinator or 24 hour nurse line if needed. Contact information provided to patient.     Plan: Routine home visit on 01/28/16. Will inform  [facility] social worker that patient still needs mobile meals and notify her of patient's possible need for home aide or personal care services since she desires to remain at home.    THN CM Care Plan Problem One        Most Recent Value   Care Plan Problem One  knowledge deficit on heart failure management   Role Documenting the Problem One  Care Management Coordinator   Care Plan for Problem One  Active   THN Long Term Goal (31-90 days)  patient will verbalize at least 3 strategies of managing heart failure with in the next 60 days   THN Long Term Goal Start Date  12/29/15   Interventions for Problem One Long Term Goal  provide [facility] heart failure packet of information and discuss,  encourage patient to read booklet on Living life  better with Heart Failure and encourage to ask questions if any,  use teach back method to evaluate patient's understanding   THN CM Short Term Goal #1 (0-30 days)  patient will identify the heart failure zones in the next 30 days   THN CM Short Term Goal #1 Start Date  12/29/15   Interventions for Short Term Goal #1  give patient the [facility] heart failure packet and explain the different heart failure zones,  use teach back method to confirm understanding,  encourage patient to know she feels daily using the heart failure zone tool    THN CM Short Term Goal #2 (0-30 days)  patient will weigh self daily and record in the next 30 days   THN CM Short Term Goal #2 Start Date  12/29/15   Interventions for Short Term Goal #2  teach patient the proper way to check weight- in the morning after using the bathroom, before getting dressed and before eating a meal,  encourage patient to monitor weight and record,  provide [facility] calendar/ notebook and instructed on how to record weight,  encourage patient to bring record of weight results to provider's follow-up visit for review       Edwena Felty A. Lux Meaders, BSN, RN-BC [place] Management Coordinator Cell: [phone]

## 2015-12-30 ENCOUNTER — Encounter: Payer: Self-pay | Admitting: *Deleted

## 2016-01-27 ENCOUNTER — Other Ambulatory Visit: Payer: Self-pay | Admitting: *Deleted

## 2016-01-27 ENCOUNTER — Encounter: Payer: Self-pay | Admitting: *Deleted

## 2016-01-27 VITALS — BP 106/58 | HR 64 | Resp 18

## 2016-01-27 DIAGNOSIS — I509 Heart failure, unspecified: Secondary | ICD-10-CM

## 2016-01-27 NOTE — Patient Outreach (Signed)
[facility]) Care Management   01/27/2016  Stephanie Cochran [birth year] [id number]  Stephanie Cochran is an 82 y.o. female  Subjective: Patient states "feeling good". Chronic back pain and knee pain causes her to be like a "yoyo"--having good days and bad days as stated.  Objective:  BP 106/58 mmHg  Pulse 64  Resp 18  SpO2 98%  Review of Systems  Constitutional: Negative.   HENT: Negative.  Negative for congestion.   Eyes:       Wears glasses  Respiratory: Negative.  Negative for cough, sputum production, shortness of breath and wheezing.        Respirations even and unlabored Lung sounds clear to auscultation  Cardiovascular: Negative for chest pain and leg swelling.       Regular rate and rhythm Has pacemaker right ankle- trace swelling  Gastrointestinal: Negative.  Negative for nausea, vomiting and abdominal pain.       Abdomen soft, non-distended and non-tender Bowel sounds positive constipation managed by fibers per patient  Genitourinary:       Wears pads for urine leakage History of left mastectomy  Musculoskeletal: Positive for back pain, joint pain and falls.       History of chronic back pain (spinal stenosis) and knee pain History of fall kyphotic  Neurological: Negative.   Endo/Heme/Allergies: Bruises/bleeds easily.       On Plavix- skin easily bruised  Psychiatric/Behavioral: Positive for memory loss.       Forgetful at times    Physical Exam  Encounter Medications:   Outpatient Encounter Prescriptions as of 01/27/2016  Medication Sig Note  . amLODipine (NORVASC) 10 MG tablet Take 1 tablet (10 mg total) by mouth daily. 12/12/2015: Taking 2.5 mg once a day   . aspirin EC 81 MG tablet Take 81 mg by mouth daily.   . carvedilol (COREG) 6.25 MG tablet Take 1 tablet (6.25 mg total) by mouth daily.   . Cholecalciferol (VITAMIN D) 2000 UNITS tablet Take 2,000 Units by mouth daily.   . clopidogrel (PLAVIX) 75 MG tablet Take 1 tablet (75 mg total) by  mouth daily.   . Cyanocobalamin (B-12) 1000 MCG CAPS Take 1,000 mcg by mouth daily.    . diphenhydramine-acetaminophen (TYLENOL PM) 25-500 MG TABS Take 0.5 tablets by mouth at bedtime as needed (sleep).    . olmesartan-hydrochlorothiazide (BENICAR HCT) 20-12.5 MG tablet Take 1 tablet by mouth daily. 01/27/2016: Needs refill  . Polyethyl Glycol-Propyl Glycol (SYSTANE OP) Place 1 drop into both eyes 2 (two) times daily as needed (dry eyes).   . rosuvastatin (CRESTOR) 5 MG tablet Take 1 tablet (5 mg total) by mouth daily.   . vitamin C (ASCORBIC ACID) 500 MG tablet Take 1,000 mg by mouth daily. 01/27/2016: Needs refill  . Vitamin D, Ergocalciferol, (DRISDOL) 50000 UNITS CAPS capsule Take 1 capsule (50,000 Units total) by mouth every 30 (thirty) days. On or about the 1st of each month   . vitamin E 400 UNIT capsule Take 400 Units by mouth daily. Reported on 12/12/2015   . letrozole (FEMARA) 2.5 MG tablet Take 1 tablet (2.5 mg total) by mouth daily. (Patient not taking: Reported on 12/12/2015)    No facility-administered encounter medications on file as of 01/27/2016.    Functional Status:   In your present state of health, do you have any difficulty performing the following activities: 01/27/2016 12/29/2015  Hearing? N N  Vision? N N  Difficulty concentrating or making decisions? Y -  Walking or  climbing stairs? Y -  Dressing or bathing? N -  Doing errands, shopping? Stephanie Cochran  Preparing Food and eating ? Y -  Using the Toilet? N -  In the past six months, have you accidently leaked urine? Y Y  Do you have problems with loss of bowel control? N N  Managing your Medications? N N  Managing your Finances? N N  Housekeeping or managing your Housekeeping? Y -    Fall/Depression Screening:    PHQ 2/9 Scores 01/27/2016 12/29/2015 12/09/2015  PHQ - 2 Score 0 0 0    Assessment:   82 year old female being seen for heart failure education and management. Arrived at patient's one-level home. Patient's niece [name]) and patient were present during this visit.  Patient considers herself under "green zone" with no reportable signs and symptoms of heart failure flare-up as stated. Breathing normally and moving around (using cane) without shortness of breath as verbalized.  She states understanding to call the doctor for help when on the yellow zone or beginning to develop increased coughing and mucus, chest pain, swelling and weight gain 3 pounds in a day or 5 pounds in a week.   Patient states to call 911 or press her medical alert device if moving to the red zone.  She is aware to use the heart failure zone tool in order to know how she feels everyday. Patient monitors her weight daily and verbalized understanding of the importance to record results on [place]. Reminders and reinforcements needed due to episodes of forgetfulness.  Her weight today is 113.9 pounds and weight ranges from 113- 116 as stated.  Patient recalls some steps in managing heart failure but needs reinforcement and review about it.   Medications are managed by patient using "pillbox" and emphasized the need to adhere to her medications as ordered and avoid letting medications run low. Niece and patient are aware to refill patient's Benicar and Vitamin C. Patient continues to drive self around and niece/ neighbor provides transportation to providers' appointments as needed.   Patient and niece received the list of private duty care agencies through the mail sent by [facility] social worker. Niece states will work on calling and arranging for patient's caregiver who can assist patient with light work chores and care.  Patient and niece deny being able to identify any other needs at this time. She agreed to be transitioned to [facility] for further disease management. Encouraged to call [facility] management coordinator or 24 hour nurse line if  needed or if future needs arise. Patient has contact information. Patient agreed to  case closure from community care management standpoint since most of her goals were met.     Plan: Will close case from community care management standpoint, with most goals met.  Will notify primary care provider of case closure, however, will transition to [place] for further disease management.     THN CM Care Plan Problem One        Most Recent Value   Care Plan Problem One  knowledge deficit on heart failure management   Role Documenting the Problem One  Care Management Coordinator   Care Plan for Problem One  Active   THN Long Term Goal (31-90 days)  patient will verbalize at least 3 strategies of managing heart failure with in the next 60 days   THN Long Term Goal Start Date  12/29/15   Interventions for Problem One Long Term Goal  review [facility]N heart failure packet and recall,  remind patient to read booklet on Living life better with Heart Failure and encourage to ask questions if any,  review the steps in managing heart failure like: monitor weight daily and record, know how she feels everyday using the heart failure zones, medication adherence (emphasized not to let medications run low), follow diet order and do exercise as tolerated,  use teach back method to evaluate patient's understanding   THN CM Short Term Goal #1 (0-30 days)  patient will identify the heart failure zones in the next 30 days   THN CM Short Term Goal #1 Start Date  12/29/15   [facility] CM Short Term Goal #1 Met Date  01/27/16 [Goal met]   THN CM Short Term Goal #2 (0-30 days)  patient will weigh self daily and record in the next 30 days   THN CM Short Term Goal #2 Start Date  12/29/15   [facility] CM Short Term Goal #2 Met Date  01/27/16 [Goal met- understands the need to keep records of weight]     Edwena Felty A. Lyris Hitchman, BSN, RN-BC [place] Management Coordinator Cell: [phone]

## 2016-01-28 ENCOUNTER — Ambulatory Visit: Payer: Medicare Other | Admitting: *Deleted

## 2016-02-11 ENCOUNTER — Other Ambulatory Visit: Payer: Self-pay | Admitting: Cardiology

## 2016-02-11 NOTE — Telephone Encounter (Signed)
Rx request sent to pharmacy.  

## 2016-02-11 NOTE — Telephone Encounter (Signed)
Please advise on refill request. Thanks, MI 

## 2016-02-18 ENCOUNTER — Other Ambulatory Visit: Payer: Self-pay

## 2016-02-18 VITALS — Ht 62.0 in | Wt 114.7 lb

## 2016-02-18 DIAGNOSIS — I509 Heart failure, unspecified: Secondary | ICD-10-CM

## 2016-02-18 NOTE — Patient Outreach (Signed)
[facility]) Care Management  [place]  02/18/2016   Stephanie Cochran Dec 07, 1917 [id number]  Initial assessment today for admission to [facility] Coach/Disease Management services.  Patient has been followed by community nurse.  Patient reports feeling well.  States she is doing daily weights and BP checks.  Patient discussed wish to remain independent in her own home for as long as possible.  She uses a cane or walker in the home to prevent recurrence of previous fall.  Encounter Medications:  Outpatient Encounter Prescriptions as of 02/18/2016  Medication Sig Note  . amLODipine (NORVASC) 10 MG tablet Take 1 tablet (10 mg total) by mouth daily. 12/12/2015: Taking 2.5 mg once a day   . aspirin EC 81 MG tablet Take 81 mg by mouth daily.   . carvedilol (COREG) 6.25 MG tablet Take 1 tablet (6.25 mg total) by mouth daily.   . Cholecalciferol (VITAMIN D) 2000 UNITS tablet Take 2,000 Units by mouth daily.   . clopidogrel (PLAVIX) 75 MG tablet Take 1 tablet (75 mg total) by mouth daily.   . Cyanocobalamin (B-12) 1000 MCG CAPS Take 1,000 mcg by mouth daily.    . diphenhydramine-acetaminophen (TYLENOL PM) 25-500 MG TABS Take 0.5 tablets by mouth at bedtime as needed (sleep).    Marland Kitchen letrozole (FEMARA) 2.5 MG tablet TAKE 1 TABLET DAILY   . olmesartan-hydrochlorothiazide (BENICAR HCT) 20-12.5 MG tablet Take 1 tablet by mouth daily. Please keep your upcoming appointment (03/16/16) for refills.   Vladimir Faster Glycol-Propyl Glycol (SYSTANE OP) Place 1 drop into both eyes 2 (two) times daily as needed (dry eyes).   . rosuvastatin (CRESTOR) 5 MG tablet Take 1 tablet (5 mg total) by mouth daily.   . vitamin C (ASCORBIC ACID) 500 MG tablet Take 1,000 mg by mouth daily. 01/27/2016: Needs refill  . Vitamin D, Ergocalciferol, (DRISDOL) 50000 UNITS CAPS capsule Take 1 capsule (50,000 Units total) by mouth every 30 (thirty) days. On or about the 1st of each month   . vitamin E 400 UNIT capsule  Take 400 Units by mouth daily. Reported on 12/12/2015    No facility-administered encounter medications on file as of 02/18/2016.    Functional Status:  In your present state of health, do you have any difficulty performing the following activities: 02/18/2016 01/27/2016  Hearing? N N  Vision? N N  Difficulty concentrating or making decisions? Tempie Donning  Walking or climbing stairs? Y Y  Dressing or bathing? N N  Doing errands, shopping? Tempie Donning  Preparing Food and eating ? Y Y  Using the Toilet? N N  In the past six months, have you accidently leaked urine? Y Y  Do you have problems with loss of bowel control? N N  Managing your Medications? Y N  Managing your Finances? N N  Housekeeping or managing your Housekeeping? Tempie Donning    Fall/Depression Screening: PHQ 2/9 Scores 02/18/2016 01/27/2016 12/29/2015 12/09/2015  PHQ - 2 Score 0 0 0 0    Assessment: Patient's goal is to self-manage her heart failure and limit fall risk.  Plan: Patient will continue daily weights, BP checks.           Patient will refer to CHF Action Plan to improve self-management strategies.           Patient will practice fall prevention strategies.           Patient will keep MD appointments.           RN will  follow up in approximately one month.

## 2016-03-03 ENCOUNTER — Telehealth: Payer: Self-pay | Admitting: Cardiology

## 2016-03-03 ENCOUNTER — Ambulatory Visit (INDEPENDENT_AMBULATORY_CARE_PROVIDER_SITE_OTHER): Payer: Medicare Other | Admitting: *Deleted

## 2016-03-03 DIAGNOSIS — I441 Atrioventricular block, second degree: Secondary | ICD-10-CM | POA: Diagnosis not present

## 2016-03-03 NOTE — Telephone Encounter (Signed)
Spoke with pt and reminded pt of remote transmission that is due today. Pt verbalized understanding.   

## 2016-03-03 NOTE — Progress Notes (Signed)
Remote pacemaker transmission.   

## 2016-03-12 LAB — CUP PACEART REMOTE DEVICE CHECK
Battery Remaining Percentage: 95.5 %
Battery Voltage: 3.01 V
Brady Statistic AP VP Percent: 75 %
Brady Statistic AP VS Percent: 1 %
Brady Statistic AS VP Percent: 25 %
Brady Statistic AS VS Percent: 1 %
Brady Statistic RV Percent Paced: 99 %
Date Time Interrogation Session: 20170607152445
Implantable Lead Implant Date: 20150818
Implantable Lead Location: 753860
Lead Channel Impedance Value: 560 Ohm
Lead Channel Pacing Threshold Amplitude: 0.875 V
Lead Channel Pacing Threshold Pulse Width: 0.5 ms
Lead Channel Sensing Intrinsic Amplitude: 1.5 mV
Lead Channel Sensing Intrinsic Amplitude: 12 mV
Lead Channel Setting Pacing Amplitude: 1.125
Lead Channel Setting Pacing Amplitude: 2 V
Lead Channel Setting Pacing Pulse Width: 0.5 ms
Lead Channel Setting Sensing Sensitivity: 3 mV
MDC IDC LEAD IMPLANT DT: 20150818
MDC IDC LEAD LOCATION: 753859
MDC IDC LEAD MODEL: 1948
MDC IDC MSMT BATTERY REMAINING LONGEVITY: 112 mo
MDC IDC MSMT LEADCHNL RA IMPEDANCE VALUE: 340 Ohm
MDC IDC MSMT LEADCHNL RA PACING THRESHOLD AMPLITUDE: 1 V
MDC IDC MSMT LEADCHNL RA PACING THRESHOLD PULSEWIDTH: 0.5 ms
MDC IDC PG SERIAL: 7663995
MDC IDC STAT BRADY RA PERCENT PACED: 74 %

## 2016-03-16 ENCOUNTER — Ambulatory Visit (INDEPENDENT_AMBULATORY_CARE_PROVIDER_SITE_OTHER): Payer: Medicare Other | Admitting: Cardiology

## 2016-03-16 ENCOUNTER — Encounter: Payer: Self-pay | Admitting: Cardiology

## 2016-03-16 VITALS — BP 130/62 | HR 63 | Ht 62.0 in | Wt 117.0 lb

## 2016-03-16 DIAGNOSIS — Z954 Presence of other heart-valve replacement: Secondary | ICD-10-CM

## 2016-03-16 DIAGNOSIS — I480 Paroxysmal atrial fibrillation: Secondary | ICD-10-CM

## 2016-03-16 DIAGNOSIS — Z950 Presence of cardiac pacemaker: Secondary | ICD-10-CM

## 2016-03-16 DIAGNOSIS — I10 Essential (primary) hypertension: Secondary | ICD-10-CM | POA: Diagnosis not present

## 2016-03-16 DIAGNOSIS — I272 Other secondary pulmonary hypertension: Secondary | ICD-10-CM

## 2016-03-16 DIAGNOSIS — E785 Hyperlipidemia, unspecified: Secondary | ICD-10-CM

## 2016-03-16 DIAGNOSIS — I441 Atrioventricular block, second degree: Secondary | ICD-10-CM

## 2016-03-16 DIAGNOSIS — Z952 Presence of prosthetic heart valve: Secondary | ICD-10-CM

## 2016-03-16 DIAGNOSIS — I2510 Atherosclerotic heart disease of native coronary artery without angina pectoris: Secondary | ICD-10-CM

## 2016-03-16 MED ORDER — ROSUVASTATIN CALCIUM 5 MG PO TABS: 5.0000 mg | ORAL_TABLET | ORAL | Status: DC

## 2016-03-16 NOTE — Patient Instructions (Addendum)
Medication Instructions:   DECREASE YOUR CRESTOR TO TAKING 5 MG BY MOUTH THREE TIMES A WEEK    Follow-Up:  Your physician wants you to follow-up in: [place] will receive a reminder letter in the mail two months in advance. If you don't receive a letter, please call our office to schedule the follow-up appointment.     If you need a refill on your cardiac medications before your next appointment, please call your pharmacy.

## 2016-03-16 NOTE — Progress Notes (Signed)
Patient ID: Stephanie Cochran, female   DOB: October 17, 1917, 82 y.o.   MRN: [id number]    Cardiology Office Note  Date:  03/16/2016   ID:  Stephanie Cochran, DOB 04-20-1918, MRN [id number]  PCP:  Cari Caraway, MD  Cardiologist:  Dr. Jenell Milliner >>> Dr. Ena Dawley     Chief complain: SOB, back pain  Primary Discharge Diagnosis:  Symptomatic Mobitz II heart block status post pacemaker implantation  Secondary Discharge Diagnosis:  1. CAD s/p CABG  2. Renal artery stenosis s/p stenting  3. Hyperlipidemia  4. Prior AVR  5. Breast cancer s/p left mastectomy  CATON Cochran is a 82 y.o. female with a past medical history significant for CAD (s/p CABG), renal artery stenosis (s/p stenting), hyperlipidemia, atrial fibrillation (post CABG), prior AVR, and breast cancer (s/p left mastectomy). She has had progressive dyspnea on exertion over the past month. Plans were for outpatient echo and myoview. When the patient presented for [facility], she was found to be in 2:1 heart block and was referred to [facility] for further evaluation.  She is on Carvedilol 6.25 twice daily at home with last dose 05-12-14 afternoon. AV block persists intermittently despite washout of this medicine.  She has had at least 2 falls in the last month. She denies frank syncope, but does report dizziness prior to falling. She is still very active and travelled to [place] recently with her family. She denies chest pain, lower extremity edema, recent fevers, chills, nausea or vomiting. ROS is otherwise negative.  Echo 05-13-14 demonstrated EF 60-65%, no RWMA, mild mitral stenosis, moderate MR, LA 45.  She was evaluated by Dr Rayann Heman with EP who recommended pacemaker implantation. Risks, benefits, and alternatives were reviewed with the patient who wished to proceed. The patient underwent implantation of a STJ dual chamber pacemaker with details as outlined above. She was monitored on telemetry overnight which demonstrated AV pacing. Left chest  was without hematoma or ecchymosis. The device was interrogated and found to be functioning normally. CXR was obtained and demonstrated no pneumothorax status post device implantation. Wound care, arm mobility, and restrictions were reviewed with the patient. Dr Rayann Heman examined the patient and considered them stable for discharge to home.  She was noted to be anemic on lab work. No melena, other obvious bleeding. Pt advised to follow up with PCP regarding anemia.   01/22/2015 - the patient is coming after 6 months she feels very well has no palpitations or syncope her pacemaker site healed very well and she doesn't have any pain at the implantation site. She has been checked by EP clinic and her pacemaker is working appropriately. She has also obtained wireless report and her pacemaker is working real well. At the last visit we have referred her for cardiac rehabilitation however it was too far and she is able to get there. She is asking about home physical therapy. She denies any chest pain, but has complained of dyspnea on exertion.. She has been experiencing problem with her balance and fell multiple times in the recent times and hit her knee.  03/16/16 - 6 months follow up, improved LE edema, no CP, stable DOE, she still lives on her own with the help of visiting nurse and her daughter living approximately 50 minutes away. She has great memory, and is able to perform her activities of daily living. She denies any palpitations or syncope. Her only concern is high blood pressure at home however it appears may be related to a blood pressure  cuff as it is always normal at the doctor's visit. She denies any orthostatic hypotension dizziness or syncope.   Recent Labs:  Wt Readings from Last 3 Encounters:  03/16/16 117 lb (53.071 kg)  02/18/16 114 lb 11.2 oz (52.028 kg)  12/09/15 116 lb (52.617 kg)    Past Medical History  Diagnosis Date  . CAD (coronary artery disease)     s/p CABG 2007 at [facility]  . HTN  (hypertension)   . PVD (peripheral vascular disease) ([place])   . Diverticulosis     history  . HLD (hyperlipidemia)   . Paroxysmal atrial fibrillation (HCC)   . Atrial flutter ([place])     post operative  . Cellulitis     left forearm  . Aortic stenosis     a. s/p bioprosthetic AVR 2007 [facility]);  b. Echo (04/2009 - [facility] in [place], [place]):  Mild LVH, normal LVF, anterior WMA, AVR ok (mean 4 mmHg), MAC, mild MR, mild TV stenosis, RVSP 44 mmHg  . Breast cancer ([place])     left mastectomy  . Post-menopausal   . Arthritis   . Pacemaker 05/14/2014    dual chamber st jude   Dr Joylene Grapes  . Pneumonia     hx of pna  . Dysrhythmia 04/2014    second degree heart block  . Hx of echocardiogram     Echo (8/15):  Mild focal basal septal hypertrophy, EF 60-65%, no RWMA, AVR ok (mean 8 mmHg), MAC, mild MS (mean 4 mmHg), mod MR, mild LAE, PASP 54 mmHg    Current Outpatient Prescriptions  Medication Sig Dispense Refill  . amLODipine (NORVASC) 2.5 MG tablet Take 2.5 mg by mouth daily.    Marland Kitchen aspirin EC 81 MG tablet Take 81 mg by mouth daily.    . carvedilol (COREG) 6.25 MG tablet Take 1 tablet (6.25 mg total) by mouth daily. 90 tablet 3  . Cholecalciferol (VITAMIN D) 2000 UNITS tablet Take 2,000 Units by mouth daily.    . clopidogrel (PLAVIX) 75 MG tablet Take 1 tablet (75 mg total) by mouth daily. 90 tablet 3  . Cyanocobalamin (B-12) 1000 MCG CAPS Take 1,000 mcg by mouth daily.     . diphenhydramine-acetaminophen (TYLENOL PM) 25-500 MG TABS Take 0.5 tablets by mouth at bedtime as needed (sleep).     Marland Kitchen letrozole (FEMARA) 2.5 MG tablet TAKE 1 TABLET DAILY 90 tablet 1  . olmesartan-hydrochlorothiazide (BENICAR HCT) 20-12.5 MG tablet Take 1 tablet by mouth daily. Please keep your upcoming appointment (03/16/16) for refills. 90 tablet 1  . Polyethyl Glycol-Propyl Glycol (SYSTANE OP) Place 1 drop into both eyes 2 (two) times daily as needed (dry eyes).    . rosuvastatin (CRESTOR) 5 MG tablet Take 1 tablet (5 mg  total) by mouth daily. 90 tablet 3  . vitamin C (ASCORBIC ACID) 500 MG tablet Take 1,000 mg by mouth daily.    . Vitamin D, Ergocalciferol, (DRISDOL) 50000 UNITS CAPS capsule Take 1 capsule (50,000 Units total) by mouth every 30 (thirty) days. On or about the 1st of each month 6 capsule 0  . vitamin E 400 UNIT capsule Take 400 Units by mouth daily. Reported on 12/12/2015     No current facility-administered medications for this visit.    Allergies:   Codeine; Penicillins; and Vicodin   Social History:  The patient  reports that she quit smoking about 55 years ago. She has never used smokeless tobacco. She reports that she does not drink alcohol or use  illicit drugs.   Family History:  The patient's family history includes Colon cancer in her sister.   ROS:  Please see the history of present illness.      All other systems reviewed and negative.   PHYSICAL EXAM: VS:  BP 130/62 mmHg  Pulse 63  Ht 5\' 2"  (1.575 m)  Wt 117 lb (53.071 kg)  BMI 21.39 kg/m2 Well nourished, well developed, in no acute distress HEENT: normal Neck: no JVD Lef upper chest wall, swelling around the PM insertion site, no increase warmth  Cardiac:  normal S1, S2; RRR; 2/6 systolic murmurRUSB Lungs:  clear to auscultation bilaterally, no wheezing, rhonchi or rales Abd: soft, nontender, no hepatomegaly Ext: trace to 1+ bilateral ankle edema Skin: warm and dry Neuro:  CNs 2-12 intact, no focal abnormalities noted  Echocardiogram: August 2015 Left ventricle: E/e&'>16.2 suggestive of elevated LV filling pressure. The cavity size was normal. There was mild focal basal hypertrophy of the septum. Systolic function was normal. The estimated ejection fraction was in the range of 60% to 65%. Wall motion was normal; there were no regional wall motion abnormalities. - Aortic valve: A bioprosthesis was present. - Mitral valve: Severely calcified annulus. Mildly thickened leaflets . Mild thickening and  calcification, with mild involvement of chords. The findings are consistent with mild stenosis. There was moderate regurgitation. - Left atrium: The atrium was mildly dilated. - Pulmonary arteries: PA peak pressure: 54 mm Hg (S).  EKG:  Sinus brady, HR 52, LAD, 1st degree AVB, no change from prior tracing     ASSESSMENT AND PLAN:  1. Chronic diastolic CHF - euvolemi, now off lasix, with elevated filling pressure and moderate pulmonary hypertension. She is already on amlodipine 10 mg daily.  2. Symptomatic Mobitz II heart block status post pacemaker implantation on 05/13/2014 Normal PM function, interrogated today. Normal remote reviewed in 3/17 and 6/17. AF burden 1.4%, longest episode <12 hours. Per last note, no OAC until burden increases.   3. Coronary artery disease:  Asymptomatic, Continue ASA, beta blocker.  decrease Crestor to 5 mg 3 times per week only.   4. Balance problems and falls - improved with discontinuation of hydrochlorothiazide, Imdur.  No falls.   5. History of aortic valve replacement with bioprosthetic valve - Normal EV function on echo in 2015.  6. History of renal artery stenosis:  Previously followed by [facility]. Normal creatinine and no reason to repeat ultrasound   7. Essential hypertension:  controlled.   8. Hyperlipidemia: Decrease Crestor to 5 m3 times per week only.   9. Paroxysmal atrial fibrillation, however minimal burden no anticoagulation secondary to advanced age and possible falls.  Follow-up in 6 months.  Ena Dawley 03/16/2016

## 2016-03-17 ENCOUNTER — Encounter: Payer: Self-pay | Admitting: Cardiology

## 2016-03-19 ENCOUNTER — Other Ambulatory Visit: Payer: Self-pay

## 2016-03-19 NOTE — Patient Outreach (Signed)
[facility]) Care Management  03/19/2016  Stephanie Cochran Jun 13, 1918 [id number]   Unsuccessful attempt to reach patient.  Unable to leave voice mail message. RN will make another attempt to reach patient within 10 working days.  Candie Mile, RN, MSN [place] [phone] Fax [phone]

## 2016-03-24 ENCOUNTER — Other Ambulatory Visit: Payer: Self-pay

## 2016-03-24 NOTE — Patient Outreach (Signed)
[place] [facility]) Care Management  03/24/2016  Stephanie Cochran [birth year] [id number]   Second unsuccessful attempt to reach patient.  Unable to leave message. RN will make another attempt to reach patient within one week.  Candie Mile, RN, MSN [place] [phone] Fax [phone]

## 2016-03-31 ENCOUNTER — Other Ambulatory Visit: Payer: Self-pay

## 2016-03-31 NOTE — Patient Outreach (Signed)
[facility]) Care Management  03/31/2016  Stephanie Cochran 03-Feb-1918 [id number]  Second unsuccessful attempt to reach patient.  RN placed call to her emergency contact [name]. Ms. [name] reports patient is doing well.  She reports patient is at her daughter's for a few days, but should be home by tomorrow.  RN will make another attempt to reach patient within 10 working days.  Candie Mile, RN, MSN [place] [phone] Fax [phone]

## 2016-04-08 ENCOUNTER — Other Ambulatory Visit: Payer: Self-pay

## 2016-04-08 DIAGNOSIS — I509 Heart failure, unspecified: Secondary | ICD-10-CM

## 2016-04-08 NOTE — Patient Outreach (Signed)
[place] [facility]) Care Management  04/08/2016  Stephanie Cochran [birth year] [id number]   Unsuccessful attempt to reach patient.  HIPAA appropriate message left requesting call back.  RN spoke last week with emergency contact [name], who reported that patient was out of town visiting with family.  RN will make another attempt to contact patient within one week.  Candie Mile, RN, MSN [place] [phone] Fax [phone]

## 2016-04-08 NOTE — Patient Outreach (Signed)
[place] [facility]) Care Management  04/08/2016  PENELLOPE KILSON 1918-06-06 [id number]   Telephonic assessment completed with patient.  She reports she is doing well, and that she is using her CHF Action Plan to monitor herself.  She is keeping a record of daily weights and BPs.  Patient expressed that she feels monthly calls will "keep her on her toes".  She wants to remain independent and able to live in her home for as long as possible.  Plan:  Patient will continue self-management of CHF.           Patient will practice safety/fall precautions.           RN will follow up during the month of August.

## 2016-04-15 ENCOUNTER — Ambulatory Visit: Payer: Self-pay

## 2016-05-12 ENCOUNTER — Other Ambulatory Visit: Payer: Self-pay

## 2016-05-12 VITALS — Wt 114.0 lb

## 2016-05-12 DIAGNOSIS — I509 Heart failure, unspecified: Secondary | ICD-10-CM

## 2016-05-12 NOTE — Patient Outreach (Signed)
[place] [facility]) Care Management  05/12/2016  Stephanie Cochran March 30, 1918 [id number]   Telephone contact today with patient.  She reports steady weights and no s/s CHF exacerbation.  She remains living independently at home.  Family visit often and help with meals.  Plan:  Patient will continue CHF self-management strategies.           RN will follow up in the month of September and evaluate for discharge.  Candie Mile, RN, MSN [place] [phone] Fax [phone]

## 2016-06-02 ENCOUNTER — Ambulatory Visit (INDEPENDENT_AMBULATORY_CARE_PROVIDER_SITE_OTHER): Payer: Medicare Other | Admitting: *Deleted

## 2016-06-02 DIAGNOSIS — Z950 Presence of cardiac pacemaker: Secondary | ICD-10-CM | POA: Diagnosis not present

## 2016-06-02 DIAGNOSIS — I441 Atrioventricular block, second degree: Secondary | ICD-10-CM

## 2016-06-02 NOTE — Progress Notes (Signed)
Remote pacemaker transmission.   

## 2016-06-04 ENCOUNTER — Encounter: Payer: Self-pay | Admitting: Cardiology

## 2016-06-11 LAB — CUP PACEART REMOTE DEVICE CHECK
Brady Statistic AP VP Percent: 78 %
Brady Statistic AP VS Percent: 1 %
Brady Statistic AS VP Percent: 21 %
Brady Statistic AS VS Percent: 1 %
Implantable Lead Implant Date: 20150818
Implantable Lead Location: 753860
Lead Channel Impedance Value: 610 Ohm
Lead Channel Pacing Threshold Amplitude: 0.75 V
Lead Channel Pacing Threshold Pulse Width: 0.5 ms
Lead Channel Sensing Intrinsic Amplitude: 12 mV
Lead Channel Sensing Intrinsic Amplitude: 2.6 mV
Lead Channel Setting Pacing Amplitude: 1 V
Lead Channel Setting Pacing Pulse Width: 0.5 ms
MDC IDC LEAD IMPLANT DT: 20150818
MDC IDC LEAD LOCATION: 753859
MDC IDC LEAD MODEL: 1948
MDC IDC MSMT BATTERY REMAINING LONGEVITY: 115 mo
MDC IDC MSMT BATTERY REMAINING PERCENTAGE: 95.5 %
MDC IDC MSMT BATTERY VOLTAGE: 3.01 V
MDC IDC MSMT LEADCHNL RA IMPEDANCE VALUE: 380 Ohm
MDC IDC MSMT LEADCHNL RA PACING THRESHOLD AMPLITUDE: 1 V
MDC IDC MSMT LEADCHNL RA PACING THRESHOLD PULSEWIDTH: 0.5 ms
MDC IDC PG SERIAL: 7663995
MDC IDC SESS DTM: 20170906080013
MDC IDC SET LEADCHNL RA PACING AMPLITUDE: 2 V
MDC IDC SET LEADCHNL RV SENSING SENSITIVITY: 3 mV
MDC IDC STAT BRADY RA PERCENT PACED: 77 %
MDC IDC STAT BRADY RV PERCENT PACED: 99 %

## 2016-06-16 ENCOUNTER — Other Ambulatory Visit: Payer: Self-pay

## 2016-06-16 VITALS — Ht 62.0 in | Wt 114.0 lb

## 2016-06-16 DIAGNOSIS — I509 Heart failure, unspecified: Secondary | ICD-10-CM

## 2016-06-16 NOTE — Patient Outreach (Signed)
[place] [facility]) Care Management  06/16/2016  ANIBAL ZINGALE [birth year] [id number]   Telephone contact with patient.  She reports she is doing well, that she saw her PCP last week and everything was good.  She reports she did get her flu shot, and also a tDap vaccination.  Patient reports monitoring daily weights and BP.  She has suffered no recent falls.  Patient reports some improvement in her appetite, and she continues to drink a can of Ensure twice a day.  Plan:  Patient will continue using CHF Action Plan to self-manage her heart disease.           Patient will continue using fall prevention strategies, including ambulatory aids (walker or cane).           RN will follow up in October.  Candie Mile, RN, MSN [place] [phone] Fax [phone]

## 2016-07-15 ENCOUNTER — Other Ambulatory Visit: Payer: Self-pay

## 2016-07-15 VITALS — Ht 62.0 in | Wt 116.0 lb

## 2016-07-15 DIAGNOSIS — I509 Heart failure, unspecified: Secondary | ICD-10-CM

## 2016-07-15 NOTE — Patient Outreach (Signed)
[place] [facility]) Care Management  07/15/2016  Stephanie Cochran 30-Jun-1918 [id number]   [place] [facility]) Care Management  07/15/2016  Stephanie Cochran 09-14-1918 [id number]   Telephone contact with patient.  She reports she is doing well.  At 82 years of age she continues to live alone, and is able to drive her car for short trips in town. She continues to monitor daily weights and BPs, and states they have been 'good'.  Her weight is steady at 116 pounds.  She had her flu shot last month.  Patient reports she continues to use Ensure supplements twice a day, and states she makes healthy food choices.  Her family continue to bring food in for her.  Patient reports fear of falling.  She has a history of a fall in the past.  Fall prevention education reinforced.  Plan:  Patient will continue using CHF Action Plan.           Patient will practice fall prevention and use cane or walker at all times.           RN will follow up in November.  Candie Mile, RN, MSN [place] [phone] Fax [phone]  Candie Mile, RN, MSN [place] [phone] Fax [phone]

## 2016-07-24 IMAGING — CR DG KNEE COMPLETE 4+V*R*
4 series · 4 of 4 positions shown · non-contrast
Comparison: 04/30/2011, and earlier.

CLINICAL DATA: [AGE] female who fell 3 days ago on the right
knee with pain. Initial encounter.

EXAM:
RIGHT KNEE - COMPLETE 4+ VIEW

[AP]
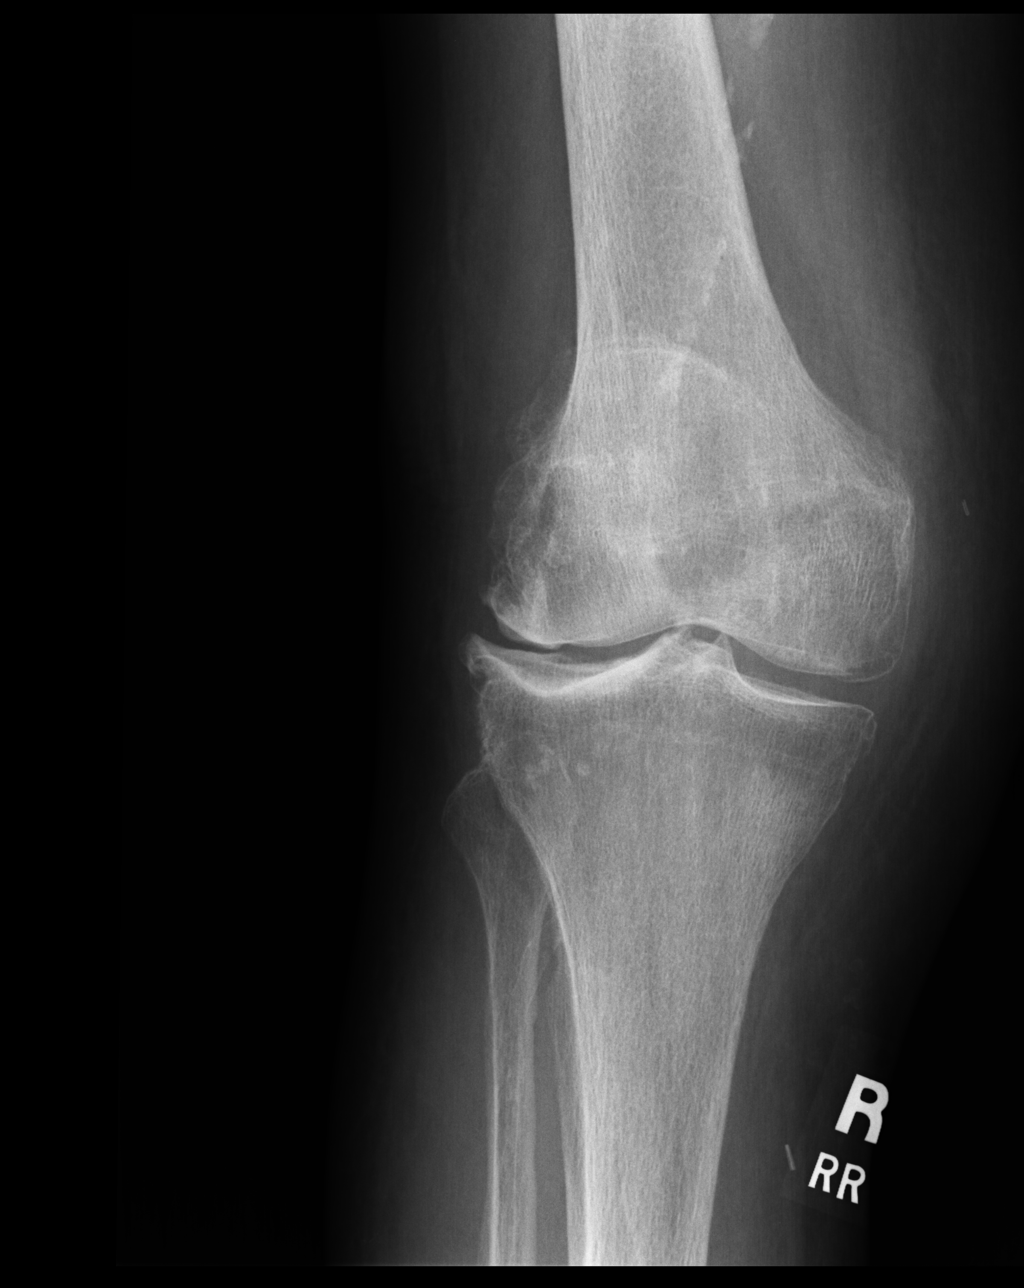

[lateral]
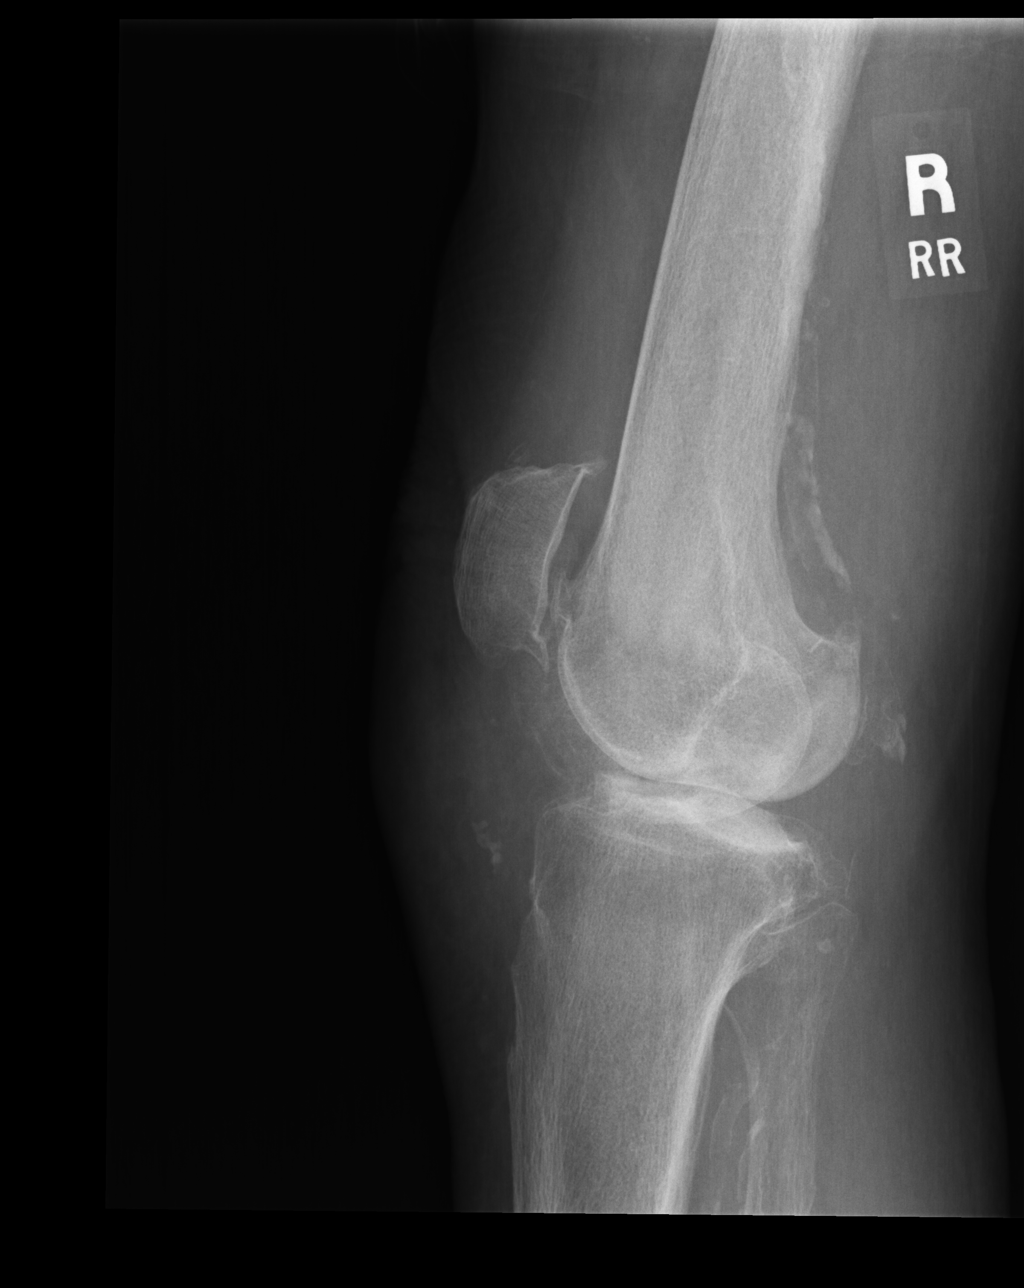

[ap axial]
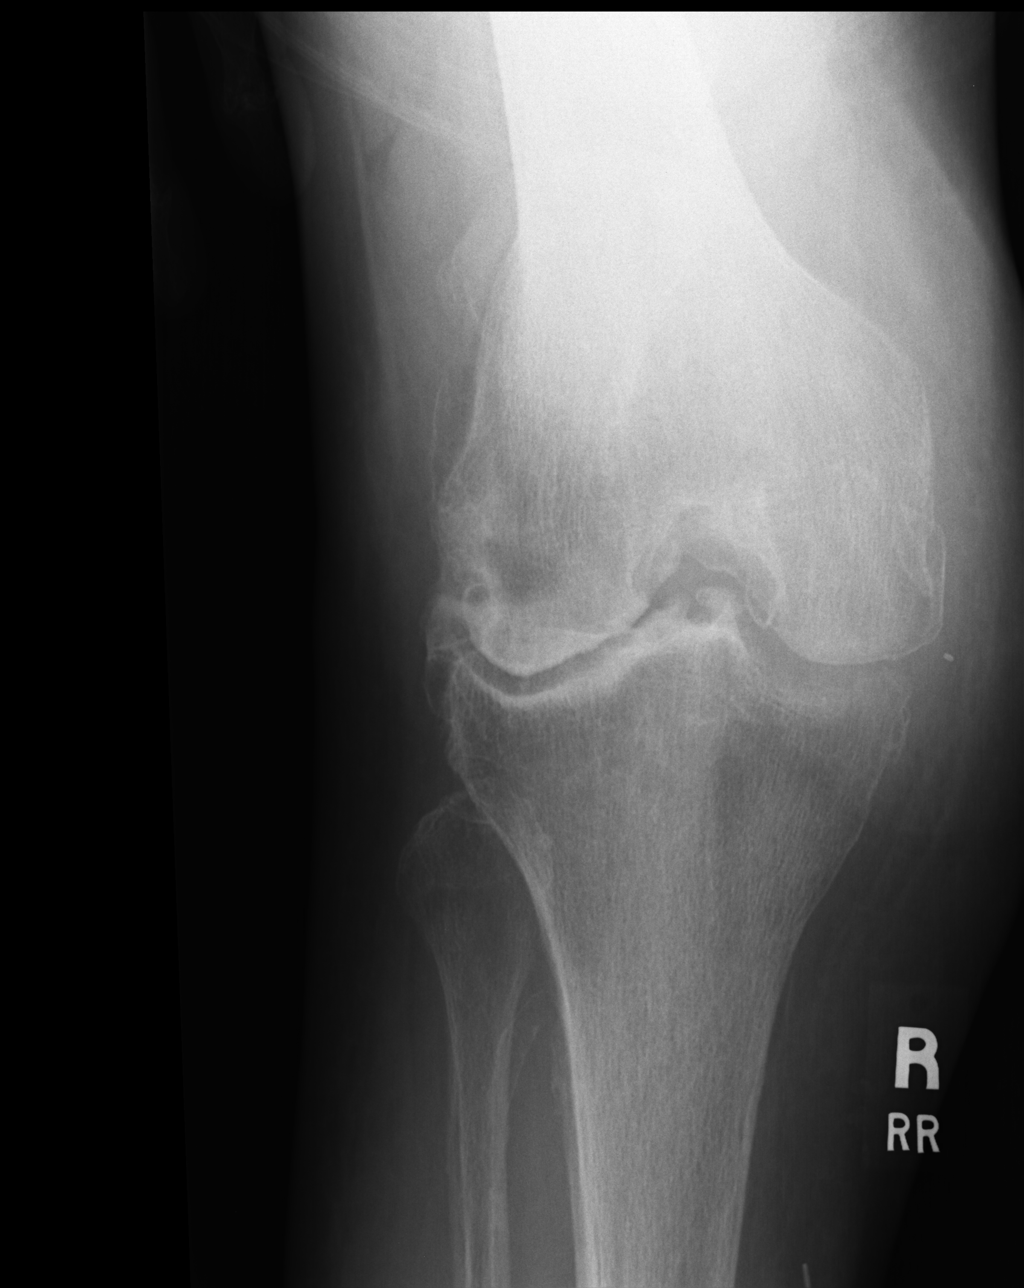

[sunrise]
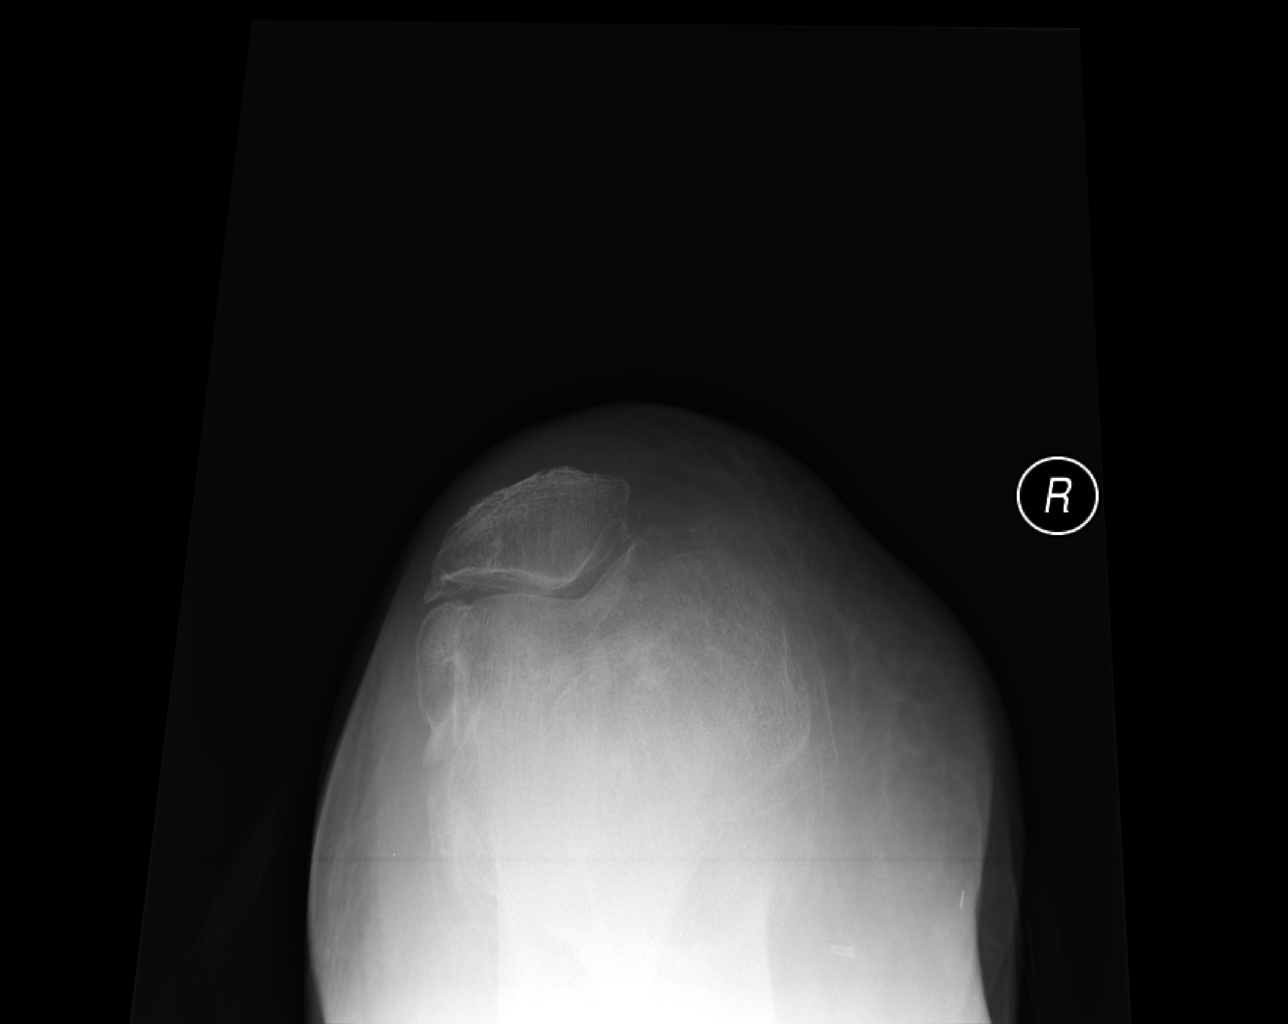

[4 of 4 positions shown; findings below may reference images not displayed]

FINDINGS: Chronic tricompartmental degenerative changes have progressed since
9219. Patella appears intact. No acute fracture or dislocation
identified. Medial superficial soft tissue swelling and stranding at
the joint space level. Bulky calcified right lower extremity
atherosclerosis again noted.
IMPRESSION: Severe, progressed degenerative changes since 9219. Soft tissue
injury but no No acute fracture or dislocation identified about the
right knee.

## 2016-07-24 IMAGING — CR DG ORBITS COMPLETE 4+V
4 series · 4 of 4 positions shown · non-contrast
Comparison: None.

CLINICAL DATA: Fall with injury to the right orbit

EXAM:
ORBITS - COMPLETE 4+ VIEW

[pa [person_name]]
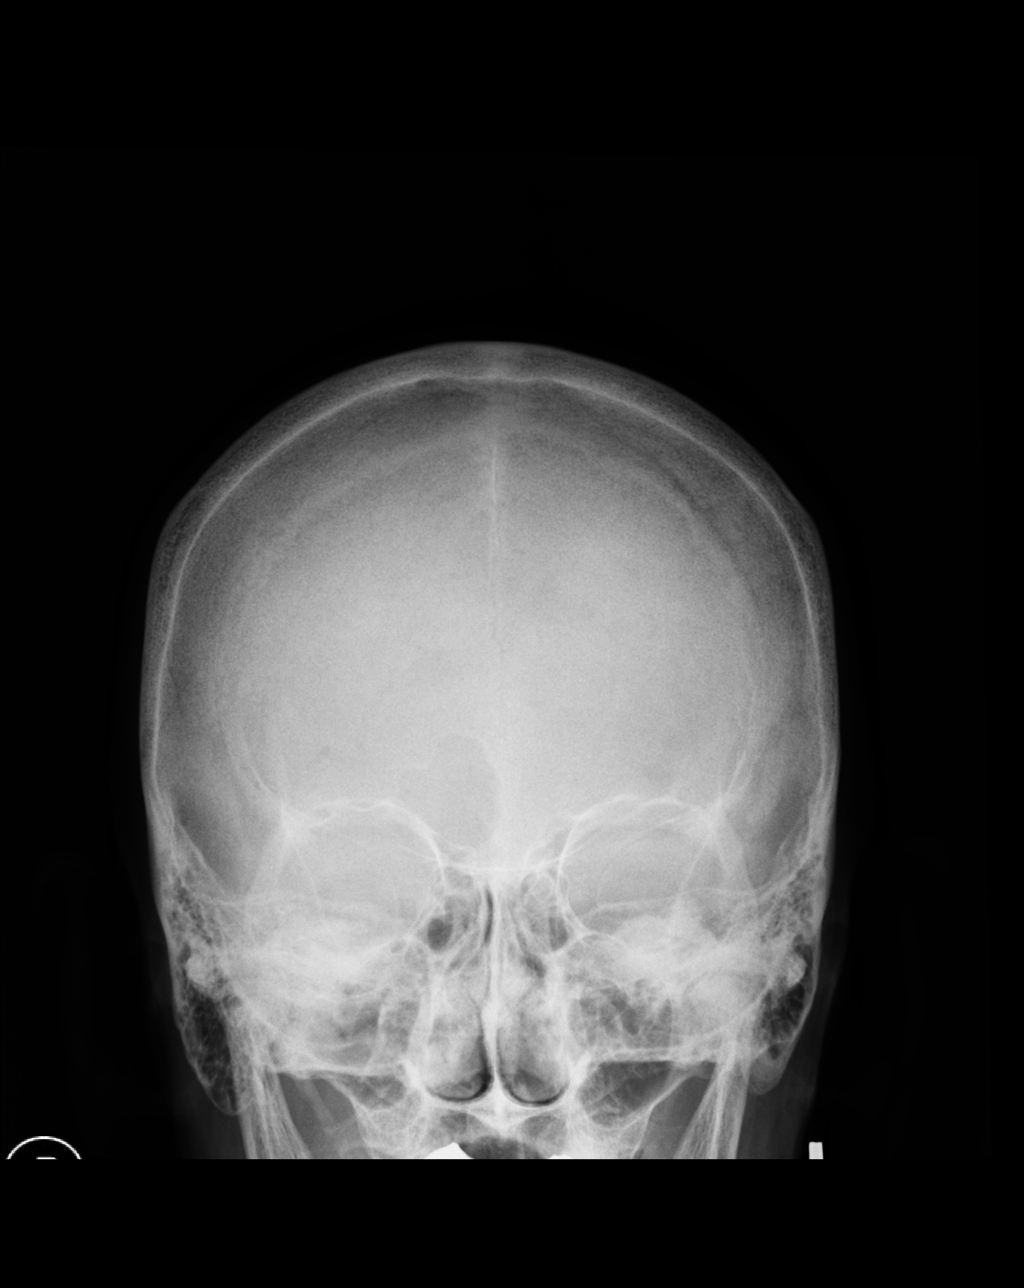

[pa waters]
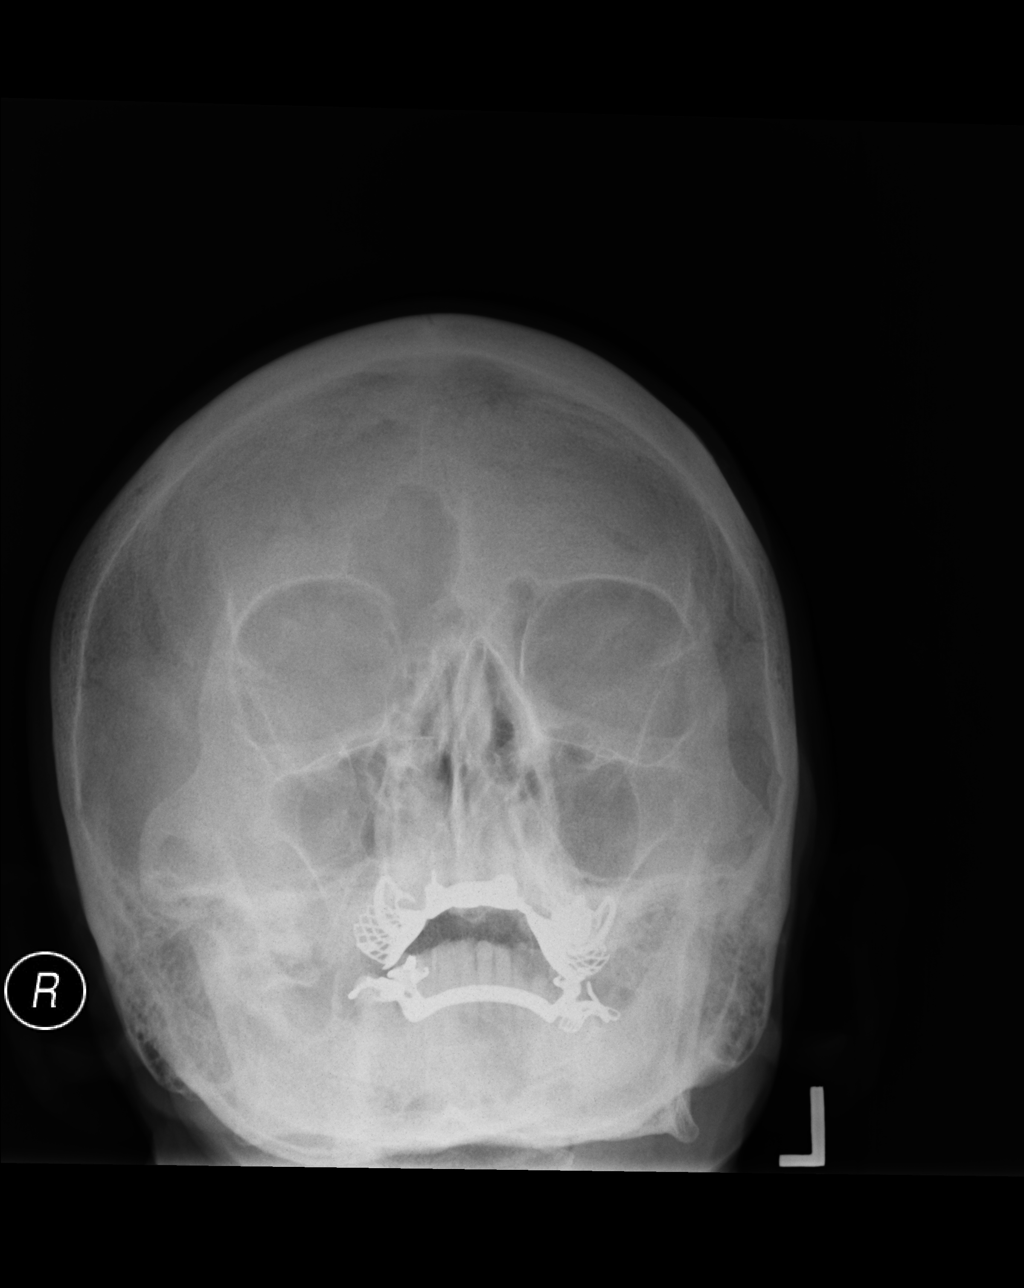

[oblique (1 of 2)]
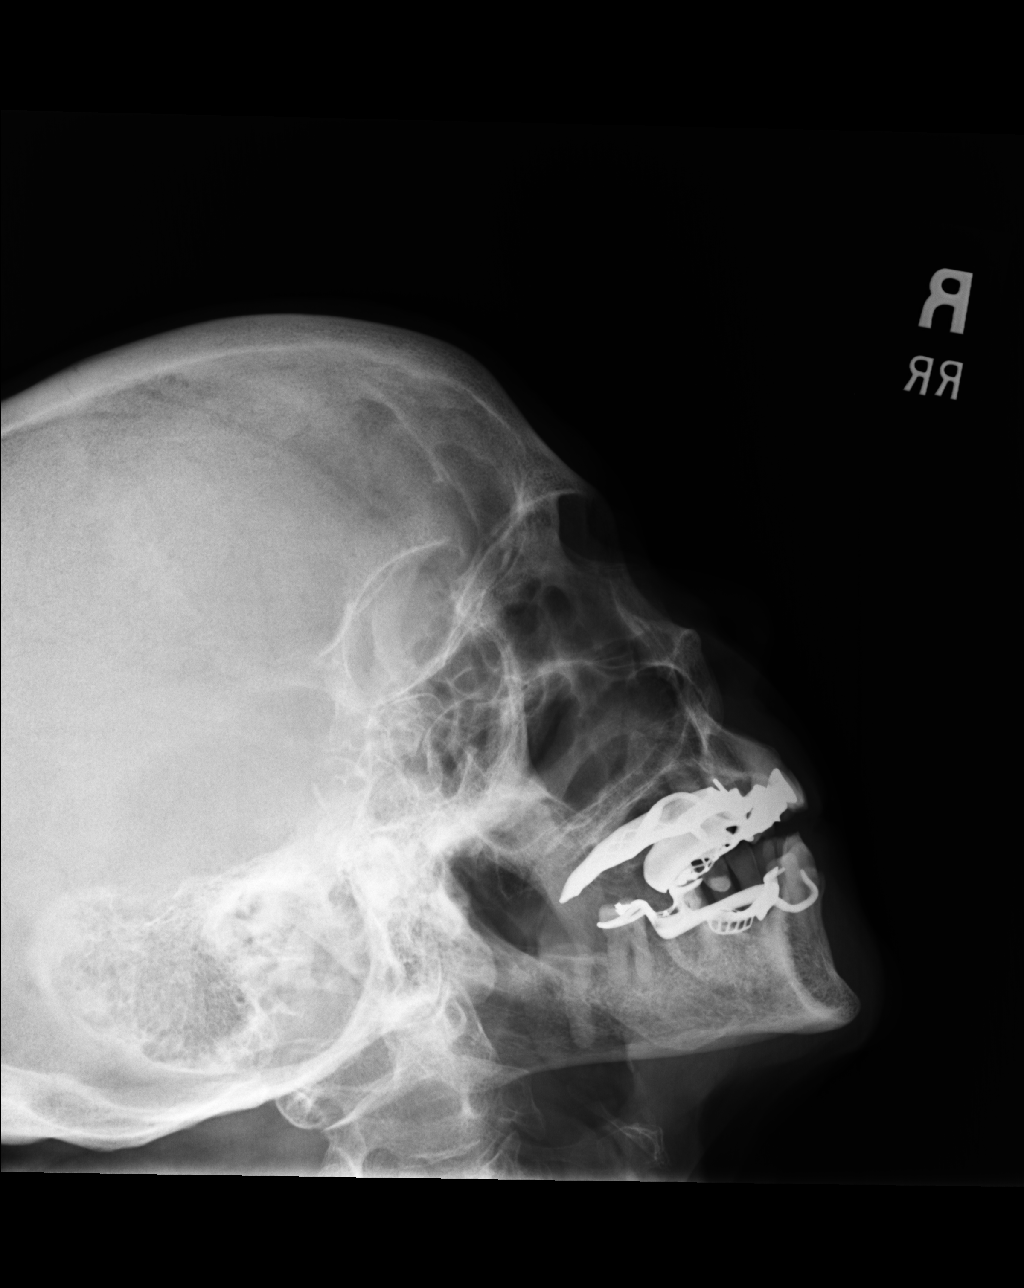

[oblique (2 of 2)]
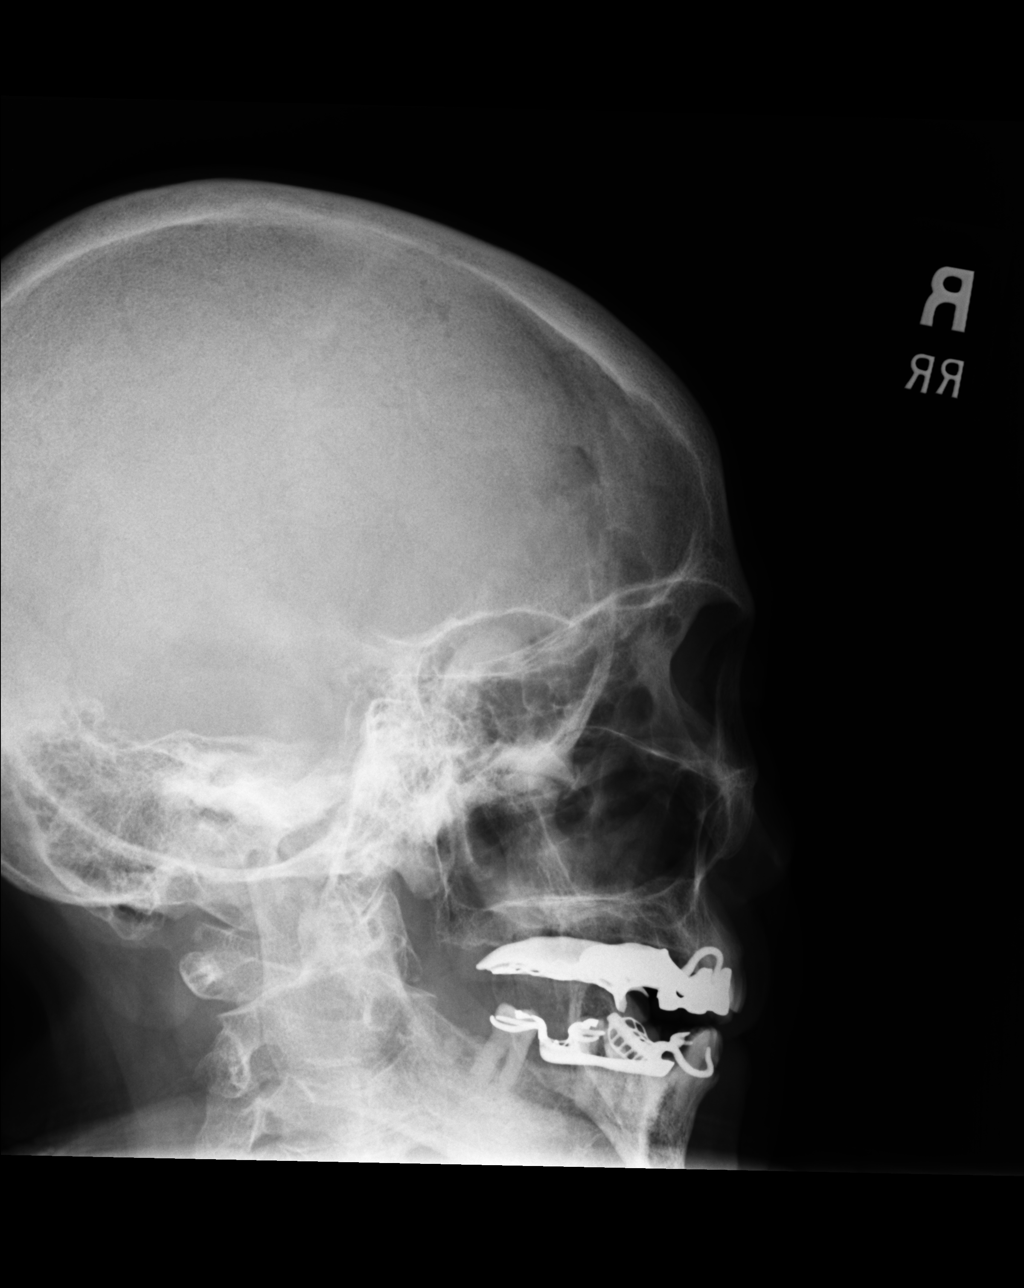

[4 of 4 positions shown; findings below may reference images not displayed]

FINDINGS: Orbital rims are intact.  No clear fluid in the maxillary sinuses.
IMPRESSION: No radiographic evidence of facial fracture.

## 2016-08-03 ENCOUNTER — Encounter: Payer: Self-pay | Admitting: Nurse Practitioner

## 2016-08-03 NOTE — Progress Notes (Signed)
Electrophysiology Office Note Date: 08/04/2016  ID:  Stephanie Cochran, DOB 1918-07-29, MRN [id number]  PCP: Cari Caraway, MD Primary Cardiologist: Stephanie Cochran Electrophysiologist: Allred  CC: Pacemaker follow-up  Stephanie Cochran is a 82 y.o. female seen today for Stephanie Cochran.  She presents today for routine electrophysiology followup.  Since last being seen in our clinic, the patient reports doing reasonably well. She still lives at home independently. She denies chest pain, palpitations, dyspnea, PND, orthopnea, nausea, vomiting, dizziness, syncope, edema, weight gain, or early satiety.  Device History: STJ dual chamber PPM implanted 2015 for Mobitz II   Past Medical History:  Diagnosis Date  . Aortic stenosis    a. s/p bioprosthetic AVR 2007 [facility]);  b. Echo (04/2009 - [facility] in [place], [place]):  Mild LVH, normal LVF, anterior WMA, AVR ok (mean 4 mmHg), MAC, mild MR, mild TV stenosis, RVSP 44 mmHg  . Arthritis   . Atrial flutter ([place])    post operative  . Breast cancer ([place])    left mastectomy  . CAD (coronary artery disease)    s/p CABG 2007 at [facility]  . Diverticulosis    history  . HLD (hyperlipidemia)   . HTN (hypertension)   . Hx of echocardiogram    Echo (8/15):  Mild focal basal septal hypertrophy, EF 60-65%, no RWMA, AVR ok (mean 8 mmHg), MAC, mild MS (mean 4 mmHg), mod MR, mild LAE, PASP 54 mmHg  . Mobitz II    a. s/p STJ dual chamber PPM 2015  . Paroxysmal atrial fibrillation (HCC)   . Post-menopausal   . PVD (peripheral vascular disease) ([place])    Past Surgical History:  Procedure Laterality Date  . AORTIC VALVE REPLACEMENT  2007   with 59mm [facility] pericardial tissue valve   . ARTERIOVENOUS GRAFT PLACEMENT W/ ENDOSCOPIC VEIN HARVEST Left    left leg  . BREAST SURGERY    . CORONARY ARTERY BYPASS GRAFT Left 2007   x3, left internal mammary artery to the left anterior descending, saphenous vein graft tot he circumflex coronary  . EYE SURGERY    .  HEMORROIDECTOMY    . MASTECTOMY Left 2008   left, breast cancer, followed by a hematology oncologist  . [place] N/A 05/14/2014   SJM Assurity Stephanie implanted by Stephanie Cochran for complete heart block  . RENAL ARTERY STENT  2006, 2010  . TONSILLECTOMY  1943    Current Outpatient Prescriptions  Medication Sig Dispense Refill  . amLODipine (NORVASC) 2.5 MG tablet Take 2.5 mg by mouth daily.    Marland Kitchen aspirin EC 81 MG tablet Take 81 mg by mouth daily.    . carvedilol (COREG) 6.25 MG tablet Take 1 tablet (6.25 mg total) by mouth daily. 90 tablet 3  . Cholecalciferol (VITAMIN D) 2000 UNITS tablet Take 2,000 Units by mouth daily.    . clopidogrel (PLAVIX) 75 MG tablet Take 1 tablet (75 mg total) by mouth daily. 90 tablet 3  . Cyanocobalamin (B-12) 1000 MCG CAPS Take 1,000 mcg by mouth daily.     . diphenhydramine-acetaminophen (TYLENOL PM) 25-500 MG TABS Take 0.5 tablets by mouth at bedtime as needed (sleep).     Marland Kitchen letrozole (FEMARA) 2.5 MG tablet TAKE 1 TABLET DAILY 90 tablet 1  . Polyethyl Glycol-Propyl Glycol (SYSTANE OP) Place 1 drop into both eyes 2 (two) times daily as needed (dry eyes).    . rosuvastatin (CRESTOR) 5 MG tablet Take 1 tablet (5 mg total) by mouth 3 (three)  times a week. 45 tablet 6  . vitamin C (ASCORBIC ACID) 500 MG tablet Take 1,000 mg by mouth daily.    . Vitamin D, Ergocalciferol, (DRISDOL) 50000 UNITS CAPS capsule Take 1 capsule (50,000 Units total) by mouth every 30 (thirty) days. On or about the 1st of each month 6 capsule 0  . vitamin E 400 UNIT capsule Take 400 Units by mouth daily. Reported on 12/12/2015     No current facility-administered medications for this visit.     Allergies:   Codeine; Penicillins; and Vicodin [hydrocodone-acetaminophen]   Social History: Social History   Social History  . Marital status: Widowed    Spouse name: N/A  . Number of children: N/A  . Years of education: N/A   Occupational History  . Not on file.   Social  History Main Topics  . Smoking status: Former Smoker    Quit date: 09/27/1960  . Smokeless tobacco: Never Used  . Alcohol use No  . Drug use: No  . Sexual activity: Not on file   Other Topics Concern  . Not on file   Social History Narrative  . No narrative on file    Family History: Family History  Problem Relation Age of Onset  . Colon cancer Sister   . Diabetes    . Heart disease    . Arthritis       Review of Systems: All other systems reviewed and are otherwise negative except as noted above.   Physical Exam: VS:  BP 138/64   Pulse 66   Ht 5' (1.524 m)   Wt 117 lb (53.1 kg)   BMI 22.85 kg/m  , BMI Body mass index is 22.85 kg/m.  GEN- The patient is elderly and kyphotic appearing, alert and oriented x 3 today.   HEENT: normocephalic, atraumatic; sclera clear, conjunctiva pink; hearing intact; oropharynx clear; neck supple  Lungs- Clear to ausculation bilaterally, normal work of breathing.  No wheezes, rales, rhonchi Heart- Regular rate and rhythm (paced) GI- soft, non-tender, non-distended, bowel sounds present  Extremities- no clubbing, cyanosis, or edema; DP/PT/radial pulses 2+ bilaterally MS- no significant deformity or atrophy Skin- warm and dry, no rash or lesion; PPM pocket well healed Psych- euthymic mood, full affect Neuro- strength and sensation are intact  PPM Interrogation- reviewed in detail today,  See PACEART report  EKG:  EKG is not ordered today.  Recent Labs: 10/24/2015: ALT 10; BUN 30; Creatinine, Ser 1.18; Hemoglobin 10.6; Platelets 263; Potassium 3.6; Sodium 127   Wt Readings from Last 3 Encounters:  08/04/16 117 lb (53.1 kg)  07/15/16 116 lb (52.6 kg)  06/16/16 114 lb (51.7 kg)     Other studies Reviewed: Additional studies/ records that were reviewed today include: Stephanie Cochran and Stephanie Francesca [place] office notes  Assessment and Plan:  1.  Complete heart block Normal PPM function See Pace Art report No changes today  2.   HTN Stable No change required today  3.  Paroxysmal atrial fibrillation Burden by device interrogation today 1.5% - all episodes <12 hours V rates controlled Per Stephanie Jackalyn Lombard last note, will reserve [place] for increased AF burden with advanced age. CHADS2VASC is 5  4.  CAD No recent ischemic symptoms Continue medical therapy    Current medicines are reviewed at length with the patient today.   The patient does not have concerns regarding her medicines.  The following changes were made today:  none  Labs/ tests ordered today include: none Orders Placed This Encounter  Procedures  . Implantable device check     Disposition:   Follow up with Stephanie Stephanie Cochran as scheduled, [name], me in 1 year     Signed, Chanetta Marshall, NP 08/04/2016 11:56 AM  [facility] [address] [place] [address] [phone] (office) [phone] (fax)

## 2016-08-04 ENCOUNTER — Ambulatory Visit (INDEPENDENT_AMBULATORY_CARE_PROVIDER_SITE_OTHER): Payer: Medicare Other | Admitting: Nurse Practitioner

## 2016-08-04 VITALS — BP 138/64 | HR 66 | Ht 60.0 in | Wt 117.0 lb

## 2016-08-04 DIAGNOSIS — I2510 Atherosclerotic heart disease of native coronary artery without angina pectoris: Secondary | ICD-10-CM

## 2016-08-04 DIAGNOSIS — I442 Atrioventricular block, complete: Secondary | ICD-10-CM

## 2016-08-04 DIAGNOSIS — I10 Essential (primary) hypertension: Secondary | ICD-10-CM | POA: Diagnosis not present

## 2016-08-04 DIAGNOSIS — I480 Paroxysmal atrial fibrillation: Secondary | ICD-10-CM

## 2016-08-04 LAB — CUP PACEART INCLINIC DEVICE CHECK
Date Time Interrogation Session: 20171108115552
Implantable Lead Implant Date: 20150818
Implantable Lead Location: 753859
Implantable Lead Model: 1948
MDC IDC LEAD IMPLANT DT: 20150818
MDC IDC LEAD LOCATION: 753860
MDC IDC PG IMPLANT DT: 20150818
MDC IDC PG SERIAL: 7663995

## 2016-08-04 NOTE — Patient Instructions (Signed)
Medication Instructions:   Your physician recommends that you continue on your current medications as directed. Please refer to the Current Medication list given to you today.   If you need a refill on your cardiac medications before your next appointment, please call your pharmacy.  Labwork: NONE ORDERED  TODAY    Testing/Procedures: NONE ORDERED  TODAY    Follow-Up:  Your physician wants you to follow-up in: [place] will receive a reminder letter in the mail two months in advance. If you don't receive a letter, please call our office to schedule the follow-up appointment.   Remote monitoring is used to monitor your Pacemaker of ICD from home. This monitoring reduces the number of office visits required to check your device to one time per year. It allows [place] to keep an eye on the functioning of your device to ensure it is working properly. You are scheduled for a device check from home on . 11/03/16..You may send your transmission at any time that day. If you have a wireless device, the transmission will be sent automatically. After your physician reviews your transmission, you will receive a postcard with your next transmission date.    Any Other Special Instructions Will Be Listed Below (If Applicable).                                                                                                                                                 ]

## 2016-08-12 ENCOUNTER — Other Ambulatory Visit: Payer: Self-pay

## 2016-08-12 VITALS — Ht 60.0 in | Wt 117.0 lb

## 2016-08-12 DIAGNOSIS — I509 Heart failure, unspecified: Secondary | ICD-10-CM

## 2016-08-12 NOTE — Patient Outreach (Signed)
[facility]) Care Management  08/12/2016  PENELLOPE KILSON 11-Sep-1918 [id number]   Telephonic assessment with patient.  She reports she is doing well, except for some chronic back pain if she is on her feet too much.  States she takes Tylenol PM at bedtime and that is effective.  Patient states she plans to spend Thanksgiving out of town at her daughter's home.  Patient is self-managing her CHF with no issues at present.  Plan:  RN will follow up in December.  Candie Mile, RN, MSN [place] [phone] Fax [phone]

## 2016-08-24 ENCOUNTER — Other Ambulatory Visit: Payer: Self-pay | Admitting: Internal Medicine

## 2016-09-08 ENCOUNTER — Ambulatory Visit: Payer: Medicare Other | Admitting: Cardiology

## 2016-09-14 ENCOUNTER — Ambulatory Visit: Payer: Self-pay

## 2016-09-15 ENCOUNTER — Other Ambulatory Visit: Payer: Self-pay

## 2016-09-15 DIAGNOSIS — Z9181 History of falling: Secondary | ICD-10-CM

## 2016-09-15 DIAGNOSIS — I509 Heart failure, unspecified: Secondary | ICD-10-CM

## 2016-09-15 NOTE — Patient Outreach (Signed)
[place] [facility]) Care Management  09/15/2016  Stephanie Cochran 04/16/1918 [id number]   Telephone contact with patient.  She reports she is doing well.  Her daughter is coming tomorrow to take her to her house for Christmas.  Patient reports CHF is stable, and no recent falls.  Plan:  RN will follow up in January.  Candie Mile, RN, MSN [place] [phone] Fax [phone]

## 2016-09-16 ENCOUNTER — Ambulatory Visit: Payer: Self-pay

## 2016-10-13 ENCOUNTER — Other Ambulatory Visit: Payer: Self-pay

## 2016-10-13 VITALS — Wt 113.0 lb

## 2016-10-13 DIAGNOSIS — I509 Heart failure, unspecified: Secondary | ICD-10-CM

## 2016-10-13 NOTE — Patient Outreach (Signed)
[place]) Care Management  10/13/2016  Stephanie Cochran 08-Sep-1918 [id number]   Telephonic assessment with patient.  She reports she enjoyed spending time with her family during the holidays.  She states she continues to use her CHF Action Plan to monitor her status.  Patient is fearful of falling.  She currently lives alone, and enjoys her independence. Encouraged her to continue using cane or walker to assist with ambulation.  Discussed flu prevention- staying away from anyone with symptoms, and using good hand hygiene.  She had her flu shot in the fall.  RN will follow up in February.  Candie Mile, RN, MSN [place] [phone] Fax [phone]

## 2016-11-03 ENCOUNTER — Telehealth: Payer: Self-pay | Admitting: Cardiology

## 2016-11-03 ENCOUNTER — Ambulatory Visit (INDEPENDENT_AMBULATORY_CARE_PROVIDER_SITE_OTHER): Payer: Medicare Other | Admitting: *Deleted

## 2016-11-03 DIAGNOSIS — I442 Atrioventricular block, complete: Secondary | ICD-10-CM

## 2016-11-03 NOTE — Progress Notes (Signed)
Remote pacemaker transmission.   

## 2016-11-03 NOTE — Telephone Encounter (Signed)
Spoke with pt and reminded pt of remote transmission that is due today. Pt verbalized understanding.   

## 2016-11-05 ENCOUNTER — Encounter: Payer: Self-pay | Admitting: Cardiology

## 2016-11-06 LAB — CUP PACEART REMOTE DEVICE CHECK
Battery Remaining Percentage: 95.5 %
Brady Statistic AP VP Percent: 89 %
Brady Statistic AP VS Percent: 1 %
Brady Statistic AS VS Percent: 1 %
Brady Statistic RV Percent Paced: 99 %
Implantable Lead Implant Date: 20150818
Implantable Lead Location: 753860
Lead Channel Pacing Threshold Amplitude: 0.75 V
Lead Channel Pacing Threshold Pulse Width: 0.5 ms
Lead Channel Sensing Intrinsic Amplitude: 2 mV
Lead Channel Setting Pacing Amplitude: 1 V
Lead Channel Setting Pacing Pulse Width: 0.5 ms
Lead Channel Setting Sensing Sensitivity: 3 mV
MDC IDC LEAD IMPLANT DT: 20150818
MDC IDC LEAD LOCATION: 753859
MDC IDC MSMT BATTERY REMAINING LONGEVITY: 112 mo
MDC IDC MSMT BATTERY VOLTAGE: 3.01 V
MDC IDC MSMT LEADCHNL RA IMPEDANCE VALUE: 360 Ohm
MDC IDC MSMT LEADCHNL RA PACING THRESHOLD AMPLITUDE: 1 V
MDC IDC MSMT LEADCHNL RA PACING THRESHOLD PULSEWIDTH: 0.5 ms
MDC IDC MSMT LEADCHNL RV IMPEDANCE VALUE: 580 Ohm
MDC IDC MSMT LEADCHNL RV SENSING INTR AMPL: 12 mV
MDC IDC PG IMPLANT DT: 20150818
MDC IDC PG SERIAL: 7663995
MDC IDC SESS DTM: 20180207171128
MDC IDC SET LEADCHNL RA PACING AMPLITUDE: 2 V
MDC IDC STAT BRADY AS VP PERCENT: 9.9 %
MDC IDC STAT BRADY RA PERCENT PACED: 86 %

## 2016-11-11 ENCOUNTER — Ambulatory Visit (INDEPENDENT_AMBULATORY_CARE_PROVIDER_SITE_OTHER): Payer: Medicare Other | Admitting: Cardiology

## 2016-11-11 VITALS — BP 172/72 | HR 63 | Ht 60.0 in | Wt 114.0 lb

## 2016-11-11 DIAGNOSIS — I442 Atrioventricular block, complete: Secondary | ICD-10-CM | POA: Diagnosis not present

## 2016-11-11 DIAGNOSIS — I272 Pulmonary hypertension, unspecified: Secondary | ICD-10-CM

## 2016-11-11 DIAGNOSIS — I5032 Chronic diastolic (congestive) heart failure: Secondary | ICD-10-CM

## 2016-11-11 DIAGNOSIS — I480 Paroxysmal atrial fibrillation: Secondary | ICD-10-CM

## 2016-11-11 DIAGNOSIS — I2510 Atherosclerotic heart disease of native coronary artery without angina pectoris: Secondary | ICD-10-CM | POA: Diagnosis not present

## 2016-11-11 DIAGNOSIS — I10 Essential (primary) hypertension: Secondary | ICD-10-CM

## 2016-11-11 DIAGNOSIS — E785 Hyperlipidemia, unspecified: Secondary | ICD-10-CM | POA: Diagnosis not present

## 2016-11-11 DIAGNOSIS — Z952 Presence of prosthetic heart valve: Secondary | ICD-10-CM | POA: Diagnosis not present

## 2016-11-11 DIAGNOSIS — I441 Atrioventricular block, second degree: Secondary | ICD-10-CM

## 2016-11-11 NOTE — Patient Instructions (Signed)
Medication Instructions:   STOP TAKING ROSUVASTATIN NOW  STOP TAKING PLAVIX    Follow-Up:  Your physician wants you to follow-up in: [place] will receive a reminder letter in the mail two months in advance. If you don't receive a letter, please call our office to schedule the follow-up appointment.       If you need a refill on your cardiac medications before your next appointment, please call your pharmacy.

## 2016-11-11 NOTE — Progress Notes (Signed)
Patient ID: Stephanie Cochran, female   DOB: 10-24-1917, 83 y.o.   MRN: [id number]    Cardiology Office Note  Date:  11/11/2016   ID:  Stephanie Cochran, DOB 1917-10-24, MRN [id number]  PCP:  Cari Caraway, MD  Cardiologist:  Dr. Jenell Milliner >>> Dr. Ena Dawley     Chief complain: SOB, back pain  Primary Discharge Diagnosis:  Symptomatic Mobitz II heart block status post pacemaker implantation  Secondary Discharge Diagnosis:  1. CAD s/p CABG  2. Renal artery stenosis s/p stenting  3. Hyperlipidemia  4. Prior AVR  5. Breast cancer s/p left mastectomy  Stephanie Cochran is a 83 y.o. female with a past medical history significant for CAD (s/p CABG), renal artery stenosis (s/p stenting), hyperlipidemia, atrial fibrillation (post CABG), prior AVR, and breast cancer (s/p left mastectomy). She received a PM for symptomatic 2. AVB in 2015.  Echo 05-13-14 demonstrated EF 60-65%, no RWMA, mild mitral stenosis, moderate MR, LA 45 mm.   11/11/16 - 6 months follow up, she is doing well, still lives at home independently, walks with a walker, she has no chest pain, DOE, palpitations, no falls, no LE edema. She has noticed worsening memory in the last 6 months. No bleeding. PM interrogated on 11/03/2016 and functioning normally.    Recent Labs:  Wt Readings from Last 3 Encounters:  11/11/16 114 lb (51.7 kg)  10/13/16 113 lb (51.3 kg)  08/12/16 117 lb (53.1 kg)    Past Medical History:  Diagnosis Date  . Aortic stenosis    a. s/p bioprosthetic AVR 2007 [facility]);  b. Echo (04/2009 - [facility] in [place], [place]):  Mild LVH, normal LVF, anterior WMA, AVR ok (mean 4 mmHg), MAC, mild MR, mild TV stenosis, RVSP 44 mmHg  . Arthritis   . Atrial flutter ([place])    post operative  . Breast cancer ([place])    left mastectomy  . CAD (coronary artery disease)    s/p CABG 2007 at [facility]  . Diverticulosis    history  . HLD (hyperlipidemia)   . HTN (hypertension)   . Hx of echocardiogram    Echo (8/15):  Mild  focal basal septal hypertrophy, EF 60-65%, no RWMA, AVR ok (mean 8 mmHg), MAC, mild MS (mean 4 mmHg), mod MR, mild LAE, PASP 54 mmHg  . Mobitz II    a. s/p STJ dual chamber PPM 2015  . Paroxysmal atrial fibrillation (H[facility])   . Post-menopausal   . PVD (peripheral vascular disease) ([place])     Current Outpatient Prescriptions  Medication Sig Dispense Refill  . amLODipine (NORVASC) 2.5 MG tablet Take 2.5 mg by mouth daily.    Marland Kitchen aspirin EC 81 MG tablet Take 81 mg by mouth daily.    . carvedilol (COREG) 6.25 MG tablet Take 1 tablet (6.25 mg total) by mouth daily. 90 tablet 3  . Cholecalciferol (VITAMIN D) 2000 UNITS tablet Take 2,000 Units by mouth daily.    . clopidogrel (PLAVIX) 75 MG tablet Take 1 tablet (75 mg total) by mouth daily. 90 tablet 3  . Cyanocobalamin (B-12) 1000 MCG CAPS Take 1,000 mcg by mouth daily.     . diphenhydramine-acetaminophen (TYLENOL PM) 25-500 MG TABS Take 0.5 tablets by mouth at bedtime as needed (sleep).     Marland Kitchen letrozole (FEMARA) 2.5 MG tablet TAKE 1 TABLET DAILY 90 tablet 1  . Polyethyl Glycol-Propyl Glycol (SYSTANE OP) Place 1 drop into both eyes 2 (two) times daily as needed (dry eyes).    Marland Kitchen  rosuvastatin (CRESTOR) 5 MG tablet Take 1 tablet (5 mg total) by mouth 3 (three) times a week. 45 tablet 6  . vitamin C (ASCORBIC ACID) 500 MG tablet Take 1,000 mg by mouth daily.    . Vitamin D, Ergocalciferol, (DRISDOL) 50000 UNITS CAPS capsule Take 1 capsule (50,000 Units total) by mouth every 30 (thirty) days. On or about the 1st of each month 6 capsule 0  . vitamin E 400 UNIT capsule Take 400 Units by mouth daily. Reported on 12/12/2015     No current facility-administered medications for this visit.     Allergies:   Codeine; Penicillins; and Vicodin [hydrocodone-acetaminophen]   Social History:  The patient  reports that she quit smoking about 56 years ago. She has never used smokeless tobacco. She reports that she does not drink alcohol or use drugs.   Family History:   The patient's family history includes Colon cancer in her sister.   ROS:  Please see the history of present illness.      All other systems reviewed and negative.   PHYSICAL EXAM: VS:  BP (!) 172/72   Pulse 63   Ht 5' (1.524 m)   Wt 114 lb (51.7 kg)   BMI 22.26 kg/m  Well nourished, well developed, in no acute distress  HEENT: normal  Neck: no JVD  Lef upper chest wall, swelling around the PM insertion site, no increase warmth  Cardiac:  normal S1, S2; RRR; 2/6 systolic murmur RUSB Lungs:  clear to auscultation bilaterally, no wheezing, rhonchi or rales  Abd: soft, nontender, no hepatomegaly  Ext: trace to 1+ bilateral ankle edema  Skin: warm and dry  Neuro:  CNs 2-12 intact, no focal abnormalities noted  Echocardiogram: August 2015 Left ventricle: E/e&'>16.2 suggestive of elevated LV filling pressure. The cavity size was normal. There was mild focal basal hypertrophy of the septum. Systolic function was normal. The estimated ejection fraction was in the range of 60% to 65%. Wall motion was normal; there were no regional wall motion abnormalities. - Aortic valve: A bioprosthesis was present. - Mitral valve: Severely calcified annulus. Mildly thickened leaflets . Mild thickening and calcification, with mild involvement of chords. The findings are consistent with mild stenosis. There was moderate regurgitation. - Left atrium: The atrium was mildly dilated. - Pulmonary arteries: PA peak pressure: 54 mm Hg (S).  EKG:  Sinus brady, HR 52, LAD, 1st degree AVB, no change from prior tracing     ASSESSMENT AND PLAN:  1. Chronic diastolic CHF - no signs of fluid overload, euvolemic, now off lasix, with elevated filling pressure and moderate pulmonary hypertension.   2. Symptomatic Mobitz II heart block status post pacemaker implantation on 05/13/2014, normal PM function, interrogated on 11/03/16.  3. Coronary artery disease:  Asymptomatic, Continue ASA, beta  blocker.  D/C Crestor sec to impaired memory. Discontinue Plavix.  4. Balance problems and falls - improved with discontinuation of hydrochlorothiazide, Imdur.  No falls.   5. History of aortic valve replacement with bioprosthetic valve - Normal EV function on echo in 2015. No new symptoms, no echo needed.   6. History of renal artery stenosis:  Previously followed by [facility]. Normal creatinine and no reason to repeat ultrasound. We will d/c Plavix as she wants to simplify her medication regimen.  7. Essential hypertension:  controlled.   8. Paroxysmal atrial fibrillation, however minimal burden no anticoagulation secondary to advanced age and possible falls.  Follow-up in 6 months.  Ena Dawley 11/11/2016

## 2016-11-17 ENCOUNTER — Other Ambulatory Visit: Payer: Self-pay

## 2016-11-17 DIAGNOSIS — Z9181 History of falling: Secondary | ICD-10-CM

## 2016-11-17 DIAGNOSIS — I509 Heart failure, unspecified: Secondary | ICD-10-CM

## 2016-11-17 NOTE — Patient Outreach (Signed)
[facility]) Care Management  11/17/2016  SOLEY LIEFER [birth year] [id number]  Telephone conference with patient.  She reports she is doing well. She expressed excitement that her 7 month old granddaughter is coming to visit today.  Patient appears compliant with CHF Action Plan, and reports no "[place] Zone" s/s. She reports that her biggest concern is fear of falling.  She states she wants to continue living alone and being as independent as possible, and that she realizes that a fall might be an end to that.  Plan:  Patient will continue self-management of her CHF.           Patient will continue fall prevention strategies.           RN will follow up in March.

## 2016-11-30 ENCOUNTER — Emergency Department (HOSPITAL_COMMUNITY)
Admission: EM | Admit: 2016-11-30 | Discharge: 2016-11-30 | Disposition: A | Discharge status: Home / Self Care | Payer: Medicare Other | Attending: Emergency Medicine | Admitting: Emergency Medicine

## 2016-11-30 ENCOUNTER — Emergency Department (HOSPITAL_COMMUNITY): Payer: Medicare Other

## 2016-11-30 ENCOUNTER — Encounter (HOSPITAL_COMMUNITY): Payer: Self-pay

## 2016-11-30 DIAGNOSIS — Z950 Presence of cardiac pacemaker: Secondary | ICD-10-CM | POA: Insufficient documentation

## 2016-11-30 DIAGNOSIS — R112 Nausea with vomiting, unspecified: Secondary | ICD-10-CM | POA: Insufficient documentation

## 2016-11-30 DIAGNOSIS — Z87891 Personal history of nicotine dependence: Secondary | ICD-10-CM | POA: Diagnosis not present

## 2016-11-30 DIAGNOSIS — I509 Heart failure, unspecified: Secondary | ICD-10-CM | POA: Diagnosis not present

## 2016-11-30 DIAGNOSIS — I2510 Atherosclerotic heart disease of native coronary artery without angina pectoris: Secondary | ICD-10-CM | POA: Insufficient documentation

## 2016-11-30 DIAGNOSIS — Z7982 Long term (current) use of aspirin: Secondary | ICD-10-CM | POA: Diagnosis not present

## 2016-11-30 DIAGNOSIS — I110 Hypertensive heart disease with heart failure: Secondary | ICD-10-CM | POA: Insufficient documentation

## 2016-11-30 DIAGNOSIS — Z951 Presence of aortocoronary bypass graft: Secondary | ICD-10-CM | POA: Diagnosis not present

## 2016-11-30 DIAGNOSIS — Z79899 Other long term (current) drug therapy: Secondary | ICD-10-CM | POA: Diagnosis not present

## 2016-11-30 DIAGNOSIS — R110 Nausea: Secondary | ICD-10-CM

## 2016-11-30 LAB — URINALYSIS, ROUTINE W REFLEX MICROSCOPIC
Bilirubin Urine: NEGATIVE
Glucose, UA: NEGATIVE mg/dL
Hgb urine dipstick: NEGATIVE
KETONES UR: NEGATIVE mg/dL
LEUKOCYTES UA: NEGATIVE
NITRITE: NEGATIVE
PROTEIN: NEGATIVE mg/dL
Specific Gravity, Urine: 1.013 (ref 1.005–1.030)
pH: 8 (ref 5.0–8.0)

## 2016-11-30 LAB — COMPREHENSIVE METABOLIC PANEL
ALT: 10 U/L — AB (ref 14–54)
AST: 24 U/L (ref 15–41)
Albumin: 3.7 g/dL (ref 3.5–5.0)
Alkaline Phosphatase: 40 U/L (ref 38–126)
Anion gap: 9 (ref 5–15)
BILIRUBIN TOTAL: 0.8 mg/dL (ref 0.3–1.2)
BUN: 24 mg/dL — ABNORMAL HIGH (ref 6–20)
CALCIUM: 8.9 mg/dL (ref 8.9–10.3)
CHLORIDE: 103 mmol/L (ref 101–111)
CO2: 24 mmol/L (ref 22–32)
Creatinine, Ser: 0.92 mg/dL (ref 0.44–1.00)
GFR, EST AFRICAN AMERICAN: 59 mL/min — AB (ref >60)
GFR, EST NON AFRICAN AMERICAN: 51 mL/min — AB (ref >60)
Glucose, Bld: 141 mg/dL — ABNORMAL HIGH (ref 65–99)
Potassium: 3.9 mmol/L (ref 3.5–5.1)
Sodium: 136 mmol/L (ref 135–145)
TOTAL PROTEIN: 6.7 g/dL (ref 6.5–8.1)

## 2016-11-30 LAB — CBC WITH DIFFERENTIAL/PLATELET
BASOS ABS: 0 10*3/uL (ref 0.0–0.1)
BASOS PCT: 0 %
EOS ABS: 0.1 10*3/uL (ref 0.0–0.7)
Eosinophils Relative: 3 %
HCT: 33.8 % — ABNORMAL LOW (ref 36.0–46.0)
Hemoglobin: 11.3 g/dL — ABNORMAL LOW (ref 12.0–15.0)
LYMPHS PCT: 21 %
Lymphs Abs: 1 10*3/uL (ref 0.7–4.0)
MCH: 32.7 pg (ref 26.0–34.0)
MCHC: 33.4 g/dL (ref 30.0–36.0)
MCV: 97.7 fL (ref 78.0–100.0)
Monocytes Absolute: 0.5 10*3/uL (ref 0.1–1.0)
Monocytes Relative: 12 %
Neutro Abs: 2.9 10*3/uL (ref 1.7–7.7)
Neutrophils Relative %: 64 %
PLATELETS: 195 10*3/uL (ref 150–400)
RBC: 3.46 MIL/uL — AB (ref 3.87–5.11)
RDW: 14.3 % (ref 11.5–15.5)
WBC: 4.5 10*3/uL (ref 4.0–10.5)

## 2016-11-30 LAB — TROPONIN I

## 2016-11-30 LAB — LIPASE, BLOOD: LIPASE: 24 U/L (ref 11–51)

## 2016-11-30 MED ORDER — SODIUM CHLORIDE 0.9 % IV SOLN
INTRAVENOUS | Status: DC
  Administered 2016-11-30: 13:00:00 via INTRAVENOUS

## 2016-11-30 MED ORDER — ONDANSETRON 4 MG PO TBDP
4.0000 mg | ORAL_TABLET | Freq: Three times a day (TID) | ORAL | 0 refills | Status: DC | PRN
Start: 2016-11-30 — End: 2017-08-10

## 2016-11-30 NOTE — ED Triage Notes (Signed)
Pt coming for home went to go into fridge when she felt dizzy and fell no LOC BP elevated upon arrival, pt in a paced rhythm Pt still c/o nausea and vomiting 8mg  IV zofran given en route. Pt denies any CP or SOB

## 2016-11-30 NOTE — ED Notes (Signed)
ED Provider at bedside. 

## 2016-11-30 NOTE — ED Provider Notes (Signed)
[facility] DEPT Provider Note   CSN: [id number] Arrival date & time: 11/30/16  1236     History   Chief Complaint Chief Complaint  Patient presents with  . Nausea    nausea    HPI Stephanie Cochran is a 83 y.o. female.  HPI  Patient presents with dizziness, nausea. Patient states that she was generally well until just prior to calling EMS. Patient was in her house, and preparing food, when she felt the sudden onset of symptoms. No pain. Patient had a minor fall to the ground, but did not lose consciousness, denies any post fall changes in her condition. She also complains of fatigue. Patient denies new medical issues, new medications, new activity. She received Zofran en route with no change in her condition.   Past Medical History:  Diagnosis Date  . Aortic stenosis    a. s/p bioprosthetic AVR 2007 [facility]);  b. Echo (04/2009 - [facility] in [place], [place]):  Mild LVH, normal LVF, anterior WMA, AVR ok (mean 4 mmHg), MAC, mild MR, mild TV stenosis, RVSP 44 mmHg  . Arthritis   . Atrial flutter ([place])    post operative  . Breast cancer ([place])    left mastectomy  . CAD (coronary artery disease)    [facility]/p [facility]G 2007 [facility]  . Diverticulosis    history  . HLD (hyperlipidemia)   . HTN (hypertension)   . Hx of echocardiogram    Echo (8/15):  Mild focal basal septal hypertrophy, EF 60-65%, no RWMA, AVR ok (mean 8 mmHg), MAC, mild MS (mean 4 mmHg), mod MR, mild LAE, PASP 54 mmHg  . Mobitz II    a. s/p STJ dual chamber PPM 2015  . Paroxysmal atrial fibrillation (HCC)   . Post-menopausal   . PVD (peripheral vascular disease) [facility])     Patient Active Problem List   Diagnosis Date Noted  . CHF (congestive heart failure) ([place]) 12/09/2015  . Risk for falls 12/09/2015  . History of recent fall 12/09/2015  . Complete heart block ([place]) 05/13/2014  . AV block, 2nd degree 05/13/2014  . Atherosclerosis of renal artery ([place]) 01/04/2012  . Lower extremity edema 11/11/2011  .  Coronary artery disease 11/11/2011  . History of aortic valve replacement with bioprosthetic valve 11/11/2011  . History of renal artery stenosis 11/11/2011  . Hypertension 11/11/2011  . Hyperlipidemia 11/11/2011    Past Surgical History:  Procedure Laterality Date  . AORTIC VALVE REPLACEMENT  2007   [facility]   . ARTERIOVENOUS GRAFT PLACEMENT W/ ENDOSCOPIC VEIN HARVEST Left    left leg  . BREAST SURGERY    . CORONARY ARTERY BYPASS GRAFT Left 2007   x3, left internal mammary artery to the left anterior descending, saphenous vein graft tot he circumflex coronary  . EYE SURGERY    . HEMORROIDECTOMY    . MASTECTOMY Left 2008   left, breast cancer, followed by a hematology oncologist  . PB[place]N/A 05/14/2014   SJM Assurity DR implanted by Dr ARayann Hemanfor complete heart block  . RENAL ARTERY STENT  2006, 2010  . TONSILLECTOMY  1943    OB History    No data available       Home Medications    Prior to Admission medications   Medication Sig Start Date End Date Taking? Authorizing Provider  amLODipine (NORVASC) 2.5 MG tablet Take 2.5 mg by mouth daily. 02/11/16   Historical Provider, MD  aspirin EC 81  MG tablet Take 81 mg by mouth daily.    Historical Provider, MD  carvedilol (COREG) 6.25 MG tablet Take 1 tablet (6.25 mg total) by mouth daily. 09/03/15   Dorothy Spark, MD  Cholecalciferol (VITAMIN D) 2000 UNITS tablet Take 2,000 Units by mouth daily.    Historical Provider, MD  Cyanocobalamin (B-12) 1000 MCG CAPS Take 1,000 mcg by mouth daily.     Historical Provider, MD  diphenhydramine-acetaminophen (TYLENOL PM) 25-500 MG TABS Take 0.5 tablets by mouth at bedtime as needed (sleep).     Historical Provider, MD  Polyethyl Glycol-Propyl Glycol (SYSTANE OP) Place 1 drop into both eyes 2 (two) times daily as needed (dry eyes).    Historical Provider, MD  vitamin C (ASCORBIC ACID) 500 MG tablet Take 1,000 mg by mouth daily.     Historical Provider, MD  Vitamin D, Ergocalciferol, (DRISDOL) 50000 UNITS CAPS capsule Take 1 capsule (50,000 Units total) by mouth every 30 (thirty) days. On or about the 1st of each month 10/03/14   Dorothy Spark, MD  vitamin E 400 UNIT capsule Take 400 Units by mouth daily. Reported on 12/12/2015    Historical Provider, MD    Family History Family History  Problem Relation Age of Onset  . Colon cancer Sister   . Diabetes    . Heart disease    . Arthritis      Social History Social History  Substance Use Topics  . Smoking status: Former Smoker    Quit date: 09/27/1960  . Smokeless tobacco: Never Used  . Alcohol use No     Allergies   Codeine; Penicillins; and Vicodin [hydrocodone-acetaminophen]   Review of Systems Review of Systems  Constitutional: Positive for fatigue.  HENT:       Per HPI, otherwise negative  Respiratory:       Per HPI, otherwise negative  Cardiovascular:       Per HPI, otherwise negative  Gastrointestinal: Positive for nausea. Negative for vomiting.  Endocrine:       Negative aside from HPI  Genitourinary:       Neg aside from HPI   Musculoskeletal:       Per HPI, otherwise negative  Skin: Negative.   Neurological: Positive for weakness. Negative for syncope.     Physical Exam Updated Vital Signs Temp 97.5 F (36.4 C) (Oral)   Ht _0  (1.575 m)   Wt 112 lb (50.8 kg)   BMI 20.49 kg/m   Physical Exam  Constitutional: She is oriented to person, place, and time. She appears well-developed and well-nourished. No distress.  HENT:  Head: Normocephalic and atraumatic.  Eyes: Conjunctivae and EOM are normal.  Cardiovascular: Normal rate and regular rhythm.   Murmur heard. Pulmonary/Chest: Effort normal and breath sounds normal. No stridor. No respiratory distress.  Abdominal: She exhibits no distension and no mass. There is no tenderness. There is no guarding.  Musculoskeletal: She exhibits no edema or deformity.  No deformity not the  lower extremity, patient flexes each independently, with no tenderness on either hip.   Neurological: She is alert and oriented to person, place, and time. No cranial nerve deficit.  Skin: Skin is warm and dry.  Psychiatric: She has a normal mood and affect.  Nursing note and vitals reviewed.    ED Treatments / Results  Labs (all labs ordered are listed, but only abnormal results are displayed) Labs Reviewed  COMPREHENSIVE METABOLIC PANEL - Abnormal; Notable for the following:  Result Value   Glucose, Bld 141 (*)    BUN 24 (*)    ALT 10 (*)    GFR calc non Af Amer 51 (*)    GFR calc Af Amer 59 (*)    All other components within normal limits  CBC WITH DIFFERENTIAL/PLATELET - Abnormal; Notable for the following:    RBC 3.46 (*)    Hemoglobin 11.3 (*)    HCT 33.8 (*)    All other components within normal limits  URINALYSIS, ROUTINE W REFLEX MICROSCOPIC - Abnormal; Notable for the following:    APPearance HAZY (*)    All other components within normal limits  LIPASE, BLOOD  TROPONIN I    ECG and cardiac monitor both w paced rhythm, otherwise unremarkable, rates upper 60's    Radiology Dg Chest Port 1 View  Result Date: 11/30/2016 CLINICAL DATA:  Dizziness and weakness. EXAM: PORTABLE CHEST 1 VIEW COMPARISON:  October 24, 2015 FINDINGS: The cardiomediastinal silhouette is stable and unremarkable. No pneumothorax. No pulmonary nodules, mass, or focal infiltrates. Stable pacemaker. IMPRESSION: No active disease. Electronically Signed   By: Dorise Bullion III M.D   On: 11/30/2016 14:44    Procedures Procedures (including critical care time)  Medications Ordered in ED Medications  0.9 %  sodium chloride infusion ( Intravenous New Bag/Given 11/30/16 1306)     Initial Impression / Assessment and Plan / ED Course  I have reviewed the triage vital signs and the nursing notes.  Pertinent labs & imaging results that were available during my care of the patient were reviewed  by me and considered in my medical decision making (see chart for details).  3:18 PM Patient awake and alert. She is now accompanied by her niece. We discussed all findings including reassuring labs, x-ray, unremarkable rhythm strip EKG. Patient continues to deny any recent indication changes, weight gain, weight loss, any pain following a fall. Patient moves each leg independently, has no hip pain in either leg. After lengthy conversation about need for continued evaluation, patient will follow-up with primary care. Given the patient's lack of additional resources, case management will evaluate her at home for consideration of additional nursing care. With reassuring findings, there is some suspicion for patient's aortic stenosis as contributing to her episode, otherwise, with reassuring findings, no evidence for ongoing coronary ischemia, pneumonia, active heart failure, patient appropriate for discharge with close outpatient follow-up.  Final Clinical Impressions(s) / ED Diagnoses   Final diagnoses:  Nausea    New Prescriptions New Prescriptions   ONDANSETRON (ZOFRAN ODT) 4 MG DISINTEGRATING TABLET    Take 1 tablet (4 mg total) by mouth every 8 (eight) hours as needed for nausea or vomiting.     Carmin Muskrat, MD 11/30/16 1520

## 2016-11-30 NOTE — Discharge Instructions (Signed)
As discussed, your evaluation today has been largely reassuring.  But, it is important that you monitor your condition carefully, and do not hesitate to return to the ED if you develop new, or concerning changes in your condition. ? ?Otherwise, please follow-up with your physician for appropriate ongoing care. ? ?

## 2016-12-03 ENCOUNTER — Other Ambulatory Visit: Payer: Self-pay

## 2016-12-03 NOTE — Patient Outreach (Signed)
Stephanie Cochran Stephanie Cochran) Care Management  12/03/16  Stephanie Cochran 23-Nov-1917 [id number]  RNCM received referral for patient follow3/17.ing a call to Stephanie Cochran office. Patient's daughter, Stephanie Cochran called due to concerns about mother living at home alone with history of falls and not eating properly. Requested callback today at [phone] or [phone] (cell).   There is a current consent on file for Stephanie Cochran to speak with her daughter Stephanie Cochran dated 12/29/15.  Successful outreach completed with patient's daughter, Stephanie Cochran. Patient identification verified with two identifiers.  Patient's daughter, Stephanie Cochran lives in [place] and patient currently lives at home alone. She is currently in town caring for her mother and will be in town until March 21. At that time, she needs to return home to pick up her son who is arriving in town. However, she does have a cousin in [place] who may be able to assist until 24 hour care can be established.  She stated that they currently do not have any advance directives in place, but have the paperwork completed and are waiting to get it signed and notarized.   Per patient's daughter, patient has a history of at least 5 times in the last year that she is aware of.  She reported that her last fall was last Tuesday. Patient fell and landed on her left elbow. She reported that patient has bruising and a skin tear. She stated that patient's neighbors are nurses and they took care of her and called EMS. Patient was seen and evaluated in the ED that day.Stephanie Cochran reported that she is very concerned for her mother's safety and needs assistance with finding 24 hour care for her mother.   She also reported that the patient has been sick with nausea and vomiting for last week. She stated that the night before last, she was vomiting for 2 1/2 hours. They did follow up with her PCP yesterday and she is on zofran for nausea/vomiting.  RNCM explained role of [place] vs.  Health coach and offered a home visit to review home safety and assess her mother and she was agreeable. Advised that [facility] SW referral had been placed and she should be hearing from our SW within next few days who can provide a list of providers who may be able to assist with 24 hour care. Will need to discuss in-home options and cost vs. Facility-based care such as assisted living.  Patient's health conditions are stable with the exception of history of falls and possible safety risk of living home alone.   RNCM provided contact information and encouraged to call with any questions or concerns. Also provided contact information for 24 hour nurse line  Plan: Home visit scheduled for next week. At that time, will complete a full assessment and address home safety concerns as well as establish a care plan.  Stephanie R. Xachary Hambly, RN, BSN, [place] Management Coordinator [phone]

## 2016-12-06 ENCOUNTER — Encounter: Payer: Self-pay | Admitting: *Deleted

## 2016-12-07 ENCOUNTER — Ambulatory Visit: Payer: Medicare Other

## 2016-12-07 ENCOUNTER — Other Ambulatory Visit: Payer: Self-pay | Admitting: *Deleted

## 2016-12-07 ENCOUNTER — Encounter: Payer: Self-pay | Admitting: *Deleted

## 2016-12-07 NOTE — Patient Outreach (Signed)
Stephanie Cochran - [place]) Care Management  12/07/2016  Stephanie Cochran [birth year] [id number]   CSW was able to make initial contact with patient today to perform phone assessment, as well as assess and assist with social work needs and services.  CSW introduced self, explained role and types of services provided through [facility] ([facility]).  CSW further explained to patient that CSW works with patient's [place], Tish Men also with [facility]. CSW then explained the reason for the call, indicating that Stephanie Cochran thought that patient would benefit from social work services and resources to assist with arranging 24 hour care and supervision in the home.  CSW obtained two HIPAA compliant identifiers from patient, which included patient's name and date of birth. Patient handed the phone to her daughter, Stephanie Cochran encouraging me to speak directly with her, as Stephanie Cochran currently handles all of patient's affairs.  Patient denied wanting placement of any kind, reporting that she plans to remain in the own home and "die in her own bed". CSW voiced understanding, agreeing to mail patient and Stephanie Cochran a list of home health agencies and private agency sitters that provide 24 hour care and supervision in the home.  Mrs. Stephanie Cochran indicated that she may already have a lead into an agency, based on a conversation she had with a congregation member earlier this morning.  Mrs. Stephanie Cochran is awaiting some resource information from this individual as well.  CSW agreed to place the packet of resource information in the mail to patient today.  CSW then agreed to follow-up with patient and Stephanie Cochran in one week to ensure that the packet of resource information was received, as well as to answer any questions they may have at that time.  Patient and Mrs. Stephanie Cochran were agreeable to this plan. Stephanie Cochran, BSW, MSW, LCSW  Licensed  Education officer, [facility]  Mailing [place] [address] [place], [place] [address] Physical Address-[address], [place], [place] [address] Toll Free Main # [phone] Fax # [phone] Cell # [phone]  Office # [phone] [email or web address]

## 2016-12-08 ENCOUNTER — Other Ambulatory Visit: Payer: Self-pay

## 2016-12-08 NOTE — Patient Outreach (Signed)
[facility]) Care Management  12/08/2016  TERRIN IMPARATO 07/04/1918 [id number]  Due to recent fall and ED visit, patient is being closed to Disease Management and opened to Community.  Plan: Notify PCP and CMA of changes.  Candie Mile, RN, MSN [place] [phone] Fax [phone]

## 2016-12-09 NOTE — Patient Outreach (Signed)
[facility]) Care Management  12/09/2016  Stephanie Cochran 10-10-1917 [id number]   Request received from Nat Christen, LCSW to mail patient private sitter agency list.   Josepha Pigg, [place] Management Assistant

## 2016-12-10 ENCOUNTER — Other Ambulatory Visit: Payer: Self-pay | Admitting: *Deleted

## 2016-12-10 NOTE — Patient Outreach (Signed)
[facility]) Care Management   12/10/2016  Stephanie Cochran October 03, 1917 [id number]  Stephanie Cochran is an 83 y.o. female   Covering for [place] (FALLS): Pt states she uses her rolling walker and cane when ambulating throughout the home. States she has some spring cleaning to do with her room and around the home. Pt indicated she is willing to remove some things throughout the home to make a safer passage using her walker. Pt also indicated she is willing to put on her non-skid slippers when in her bathroom to prevent risk of falling instead just wearing her socks with a minimal grip. Pt has also indicated she would have someone with her when sue is ambulating outside the home. Also reports she has a Med-Alert system (necklace) but family states pt does not use as often.  HF: Pt states she is very familiar with HF and the zones indicating she has the zones up in the kitchen area to view if needed. Pt was able to verify she is in the GREEN zone with no reported symptoms other then some ongoing sinuitis but no fluid retention or troubling breathing. Pt states she is able to feel her pacer at times and has a monitoring device in her room for reports to the agency. Pt has also report no swelling to her lower legs.  GI: Pt reports having issues with constipation and admits she does not drink enough water. Pt has committed to drinking 3 (16 oz) bottles of water prior to RN case manager next home visit in April. Pt states she barely drinks around one bottle of water daily (16 oz). Pt and family very grateful for the visit today and extended great gratitude for the information provided.   Objective:   Review of Systems  Constitutional: Negative.   HENT: Negative.   Eyes: Negative.   Respiratory: Negative.   Cardiovascular: Negative.   Gastrointestinal: Positive for constipation.  Genitourinary: Negative.   Musculoskeletal: Negative.   Skin:  Negative.   Neurological: Negative.   Endo/Heme/Allergies: Negative.   Psychiatric/Behavioral: Negative.     Physical Exam  Constitutional: She is oriented to person, place, and time. She appears well-developed and well-nourished.  HENT:  Right Ear: External ear normal.  Left Ear: External ear normal.  Eyes: EOM are normal.  Neck: Normal range of motion.  Cardiovascular: Normal heart sounds.   Respiratory: Effort normal and breath sounds normal.  GI: Soft. Bowel sounds are normal.  Musculoskeletal: Normal range of motion.  Neurological: She is alert and oriented to person, place, and time.  Skin: Skin is warm and dry.  Psychiatric: She has a normal mood and affect. Her behavior is normal. Judgment and thought content normal.    Encounter Medications:   Outpatient Encounter Prescriptions as of 12/10/2016  Medication Sig  . amLODipine (NORVASC) 2.5 MG tablet Take 2.5 mg by mouth daily.  Marland Kitchen aspirin EC 81 MG tablet Take 81 mg by mouth daily.  . carvedilol (COREG) 6.25 MG tablet Take 1 tablet (6.25 mg total) by mouth daily.  . Cholecalciferol (VITAMIN D) 2000 UNITS tablet Take 2,000 Units by mouth daily.  . clopidogrel (PLAVIX) 75 MG tablet Take 75 mg by mouth daily.  . Cyanocobalamin (B-12) 1000 MCG CAPS Take 1,000 mcg by mouth daily.   . diphenhydramine-acetaminophen (TYLENOL PM) 25-500 MG TABS Take 0.5 tablets by mouth at bedtime as needed (sleep).   Marland Kitchen lidocaine (LIDODERM) 5 % Place 1 patch onto the skin  daily. Remove & Discard patch within 12 hours or as directed by MD  . ondansetron (ZOFRAN ODT) 4 MG disintegrating tablet Take 1 tablet (4 mg total) by mouth every 8 (eight) hours as needed for nausea or vomiting.  Vladimir Faster Glycol-Propyl Glycol (SYSTANE OP) Place 1 drop into both eyes 2 (two) times daily as needed (dry eyes).  . vitamin C (ASCORBIC ACID) 500 MG tablet Take 1,000 mg by mouth daily.  . vitamin E 400 UNIT capsule Take 400 Units by mouth daily. Reported on 12/12/2015   . rosuvastatin (CRESTOR) 5 MG tablet Take 5 mg by mouth daily.  . Vitamin D, Ergocalciferol, (DRISDOL) 50000 UNITS CAPS capsule Take 1 capsule (50,000 Units total) by mouth every 30 (thirty) days. On or about the 1st of each month (Patient not taking: Reported on 11/30/2016)   No facility-administered encounter medications on file as of 12/10/2016.     Functional Status:   In your present state of health, do you have any difficulty performing the following activities: 12/07/2016 11/17/2016  Hearing? N N  Vision? N N  Difficulty concentrating or making decisions? Tempie Donning  Walking or climbing stairs? Y Y  Dressing or bathing? N N  Doing errands, shopping? Tempie Donning  Preparing Food and eating ? Y -  Using the Toilet? N -  In the past six months, have you accidently leaked urine? N -  Do you have problems with loss of bowel control? N -  Managing your Medications? Y -  Managing your Finances? Y -  Housekeeping or managing your Housekeeping? Y -  Some recent data might be hidden    Fall/Depression Screening:    PHQ 2/9 Scores 12/07/2016 02/18/2016 01/27/2016 12/29/2015 12/09/2015  PHQ - 2 Score 0 0 0 0 0   BP 132/72 (BP Location: Right Arm, Patient Position: Sitting, Cuff Size: Normal)   Pulse 63   Resp 20   Ht 1.524 m (5')   Wt 112 lb (50.8 kg)   SpO2 98%   BMI 21.87 kg/m    Assessment:   Enrollment into the [facility] program for [employer] resources and consult [facility] social worker Case management related to falls  Knowledge base related to HF (pacer) GI related to constipation   Plan:  Will obtain a signed consent for [facility] services for community services. Will completed a physical assessment and report any abnormal findings.  Will verify [facility] services as a short term case management services and provide the needed resources needs. Will also provide a [facility] calendar which contains several community resources that will be discussed today (mobile meals, SCATS, Senior resources, [employer]) and  verified pt has communicated with the [place] worker with the receival of the information packet on community resources).  Will educate on fall prevention and use of safety measures both inside and outside the home. Will provide printed EMMI material on the following: -Preventing Falls in the older adults/HF: Working with your doctor and HF: when to call your doctor or 911.   Due to pt's past involvement with a telephone disease management on HF will reiterated on the HF zones and verify pt's understanding. Will further provide a safety home evaluation and encouraged removal of rugs and hazard items that pt maybe at risk for falls when ambulating throughout the home. Will encouraged pt to have non-skid slippers upon entering the bathrooms or tile flooring to prevent risk of falls with just wearing her socks. Will strongly encourage pt to use the  Med-Alert push button when needed to risk sever damages or incidents with her falls. Will also verified system active both inside and outside in the yard however with limited range.  Will verify pt's understanding of HF and awareness of the HF zones. Will review and confirm pt remains in the GREEN zone and remains aware of what to of if acute symptoms should occur. Will verified no edema note to pt's lower extremities.  Will discussed regularity and use of stool softeners and laxatives when needed.  Encouraged pt to sue stool weekly with laxatives until she is regulated 2-3 times weekly however confirm usage with her providers if unable to control or regulate.  Will verify pt's understanding that she needs to hydrate herself with enough fluids to prevent constipation but no to over hydrate herself due to her HF.  Will generate a plan of care with goals that will be discussed with pt today involving safety measures to prevent falls and regularity (GI). Will send initial home visit report to pt's provider and update [place] on pt's disposition with [facility]  services. No other inquires or request at this time as pt has been informed that she will receive a call from [place] to scheduled a home visit for next month according to pt's schedule.   Raina Mina, RN Care Management Coordinator [place] Office [phone]

## 2016-12-14 ENCOUNTER — Emergency Department (HOSPITAL_COMMUNITY): Payer: Medicare Other

## 2016-12-14 ENCOUNTER — Inpatient Hospital Stay (HOSPITAL_COMMUNITY)
Admission: EM | Admit: 2016-12-14 | Discharge: 2016-12-18 | DRG: 064 | Disposition: A | Discharge status: Skilled Nursing Facility | Payer: Medicare Other | Attending: Internal Medicine | Admitting: Internal Medicine

## 2016-12-14 ENCOUNTER — Observation Stay (HOSPITAL_COMMUNITY): Payer: Medicare Other

## 2016-12-14 ENCOUNTER — Other Ambulatory Visit: Payer: Self-pay | Admitting: Emergency Medicine

## 2016-12-14 ENCOUNTER — Encounter (HOSPITAL_COMMUNITY): Payer: Self-pay

## 2016-12-14 ENCOUNTER — Other Ambulatory Visit: Payer: Self-pay | Admitting: *Deleted

## 2016-12-14 DIAGNOSIS — D649 Anemia, unspecified: Secondary | ICD-10-CM

## 2016-12-14 DIAGNOSIS — R778 Other specified abnormalities of plasma proteins: Secondary | ICD-10-CM

## 2016-12-14 DIAGNOSIS — R410 Disorientation, unspecified: Secondary | ICD-10-CM

## 2016-12-14 DIAGNOSIS — G9340 Encephalopathy, unspecified: Secondary | ICD-10-CM | POA: Diagnosis not present

## 2016-12-14 DIAGNOSIS — I110 Hypertensive heart disease with heart failure: Secondary | ICD-10-CM | POA: Diagnosis present

## 2016-12-14 DIAGNOSIS — E785 Hyperlipidemia, unspecified: Secondary | ICD-10-CM | POA: Diagnosis present

## 2016-12-14 DIAGNOSIS — I639 Cerebral infarction, unspecified: Secondary | ICD-10-CM | POA: Diagnosis not present

## 2016-12-14 DIAGNOSIS — R748 Abnormal levels of other serum enzymes: Secondary | ICD-10-CM | POA: Diagnosis present

## 2016-12-14 DIAGNOSIS — Z9181 History of falling: Secondary | ICD-10-CM

## 2016-12-14 DIAGNOSIS — E784 Other hyperlipidemia: Secondary | ICD-10-CM | POA: Diagnosis not present

## 2016-12-14 DIAGNOSIS — I739 Peripheral vascular disease, unspecified: Secondary | ICD-10-CM | POA: Diagnosis present

## 2016-12-14 DIAGNOSIS — Z953 Presence of xenogenic heart valve: Secondary | ICD-10-CM

## 2016-12-14 DIAGNOSIS — Z853 Personal history of malignant neoplasm of breast: Secondary | ICD-10-CM

## 2016-12-14 DIAGNOSIS — Z87891 Personal history of nicotine dependence: Secondary | ICD-10-CM

## 2016-12-14 DIAGNOSIS — Z9012 Acquired absence of left breast and nipple: Secondary | ICD-10-CM

## 2016-12-14 DIAGNOSIS — R627 Adult failure to thrive: Secondary | ICD-10-CM | POA: Diagnosis present

## 2016-12-14 DIAGNOSIS — I10 Essential (primary) hypertension: Secondary | ICD-10-CM | POA: Diagnosis not present

## 2016-12-14 DIAGNOSIS — Z7982 Long term (current) use of aspirin: Secondary | ICD-10-CM

## 2016-12-14 DIAGNOSIS — Z66 Do not resuscitate: Secondary | ICD-10-CM | POA: Diagnosis present

## 2016-12-14 DIAGNOSIS — R55 Syncope and collapse: Secondary | ICD-10-CM | POA: Diagnosis present

## 2016-12-14 DIAGNOSIS — R531 Weakness: Secondary | ICD-10-CM

## 2016-12-14 DIAGNOSIS — I509 Heart failure, unspecified: Secondary | ICD-10-CM | POA: Diagnosis present

## 2016-12-14 DIAGNOSIS — Z951 Presence of aortocoronary bypass graft: Secondary | ICD-10-CM

## 2016-12-14 DIAGNOSIS — F0390 Unspecified dementia without behavioral disturbance: Secondary | ICD-10-CM | POA: Diagnosis present

## 2016-12-14 DIAGNOSIS — I63512 Cerebral infarction due to unspecified occlusion or stenosis of left middle cerebral artery: Secondary | ICD-10-CM | POA: Insufficient documentation

## 2016-12-14 DIAGNOSIS — I480 Paroxysmal atrial fibrillation: Secondary | ICD-10-CM | POA: Diagnosis present

## 2016-12-14 DIAGNOSIS — M25512 Pain in left shoulder: Secondary | ICD-10-CM | POA: Diagnosis present

## 2016-12-14 DIAGNOSIS — Z950 Presence of cardiac pacemaker: Secondary | ICD-10-CM

## 2016-12-14 DIAGNOSIS — Z833 Family history of diabetes mellitus: Secondary | ICD-10-CM

## 2016-12-14 DIAGNOSIS — R7989 Other specified abnormal findings of blood chemistry: Secondary | ICD-10-CM

## 2016-12-14 DIAGNOSIS — Z7902 Long term (current) use of antithrombotics/antiplatelets: Secondary | ICD-10-CM

## 2016-12-14 DIAGNOSIS — I2510 Atherosclerotic heart disease of native coronary artery without angina pectoris: Secondary | ICD-10-CM | POA: Diagnosis present

## 2016-12-14 DIAGNOSIS — Z955 Presence of coronary angioplasty implant and graft: Secondary | ICD-10-CM

## 2016-12-14 LAB — CBC
HCT: 33.9 % — ABNORMAL LOW (ref 36.0–46.0)
HCT: 32.2 % — ABNORMAL LOW (ref 36.0–46.0)
HEMOGLOBIN: 11.4 g/dL — AB (ref 12.0–15.0)
Hemoglobin: 10.8 g/dL — ABNORMAL LOW (ref 12.0–15.0)
MCH: 33 pg (ref 26.0–34.0)
MCH: 32.4 pg (ref 26.0–34.0)
MCHC: 33.6 g/dL (ref 30.0–36.0)
MCHC: 33.5 g/dL (ref 30.0–36.0)
MCV: 98.3 fL (ref 78.0–100.0)
MCV: 96.7 fL (ref 78.0–100.0)
PLATELETS: 211 10*3/uL (ref 150–400)
Platelets: 224 10*3/uL (ref 150–400)
RBC: 3.45 MIL/uL — AB (ref 3.87–5.11)
RBC: 3.33 MIL/uL — AB (ref 3.87–5.11)
RDW: 14.6 % (ref 11.5–15.5)
RDW: 14.3 % (ref 11.5–15.5)
WBC: 4.8 10*3/uL (ref 4.0–10.5)
WBC: 5 10*3/uL (ref 4.0–10.5)

## 2016-12-14 LAB — I-STAT CHEM 8, ED
BUN: 17 mg/dL (ref 6–20)
CHLORIDE: 102 mmol/L (ref 101–111)
Calcium, Ion: 1.18 mmol/L (ref 1.15–1.40)
Creatinine, Ser: 1 mg/dL (ref 0.44–1.00)
GLUCOSE: 107 mg/dL — AB (ref 65–99)
HCT: 33 % — ABNORMAL LOW (ref 36.0–46.0)
Hemoglobin: 11.2 g/dL — ABNORMAL LOW (ref 12.0–15.0)
POTASSIUM: 4.2 mmol/L (ref 3.5–5.1)
Sodium: 137 mmol/L (ref 135–145)
TCO2: 28 mmol/L (ref 0–100)

## 2016-12-14 LAB — CBC AND DIFFERENTIAL
HCT: 32 % — AB (ref 36–46)
HEMATOCRIT: 33 % — AB (ref 36–46)
HEMATOCRIT: 34 % — AB (ref 36–46)
HEMOGLOBIN: 10.8 g/dL — AB (ref 12.0–16.0)
HEMOGLOBIN: 11.2 g/dL — AB (ref 12.0–16.0)
HEMOGLOBIN: 11.4 g/dL — AB (ref 12.0–16.0)
PLATELETS: 224 10*3/uL (ref 150–399)
Platelets: 211 10*3/uL (ref 150–399)
WBC: 4.8 10^3/mL
WBC: 5 10*3/mL

## 2016-12-14 LAB — COMPREHENSIVE METABOLIC PANEL
ALBUMIN: 3.5 g/dL (ref 3.5–5.0)
ALK PHOS: 40 U/L (ref 38–126)
ALT: 7 U/L — AB (ref 14–54)
AST: 23 U/L (ref 15–41)
Anion gap: 8 (ref 5–15)
BILIRUBIN TOTAL: 0.9 mg/dL (ref 0.3–1.2)
BUN: 15 mg/dL (ref 6–20)
CALCIUM: 8.8 mg/dL — AB (ref 8.9–10.3)
CO2: 26 mmol/L (ref 22–32)
Chloride: 102 mmol/L (ref 101–111)
Creatinine, Ser: 0.93 mg/dL (ref 0.44–1.00)
GFR calc Af Amer: 58 mL/min — ABNORMAL LOW (ref >60)
GFR, EST NON AFRICAN AMERICAN: 50 mL/min — AB (ref >60)
GLUCOSE: 111 mg/dL — AB (ref 65–99)
POTASSIUM: 4.3 mmol/L (ref 3.5–5.1)
Sodium: 136 mmol/L (ref 135–145)
TOTAL PROTEIN: 6.2 g/dL — AB (ref 6.5–8.1)

## 2016-12-14 LAB — URINALYSIS, ROUTINE W REFLEX MICROSCOPIC
Bilirubin Urine: NEGATIVE
Glucose, UA: NEGATIVE mg/dL
HGB URINE DIPSTICK: NEGATIVE
Ketones, ur: NEGATIVE mg/dL
Leukocytes, UA: NEGATIVE
Nitrite: NEGATIVE
PH: 7 (ref 5.0–8.0)
Protein, ur: NEGATIVE mg/dL
SPECIFIC GRAVITY, URINE: 1.008 (ref 1.005–1.030)

## 2016-12-14 LAB — BASIC METABOLIC PANEL
BUN: 15 mg/dL (ref 4–21)
BUN: 17 mg/dL (ref 4–21)
CREATININE: 0.8 mg/dL (ref 0.5–1.1)
CREATININE: 0.9 mg/dL (ref 0.5–1.1)
Creatinine: 1 mg/dL (ref 0.5–1.1)
Glucose: 107 mg/dL
Glucose: 111 mg/dL
Potassium: 4.2 mmol/L (ref 3.4–5.3)
Potassium: 4.3 mmol/L (ref 3.4–5.3)
Sodium: 136 mmol/L — AB (ref 137–147)
Sodium: 137 mmol/L (ref 137–147)

## 2016-12-14 LAB — CREATININE, SERUM
Creatinine, Ser: 0.81 mg/dL (ref 0.44–1.00)
GFR calc Af Amer: >60 mL/min (ref >60)
GFR calc non Af Amer: 59 mL/min — ABNORMAL LOW (ref >60)

## 2016-12-14 LAB — DIFFERENTIAL
BASOS ABS: 0 10*3/uL (ref 0.0–0.1)
Basophils Relative: 1 %
Eosinophils Absolute: 0.2 10*3/uL (ref 0.0–0.7)
Eosinophils Relative: 5 %
LYMPHS ABS: 1.8 10*3/uL (ref 0.7–4.0)
Lymphocytes Relative: 37 %
Monocytes Absolute: 0.7 10*3/uL (ref 0.1–1.0)
Monocytes Relative: 14 %
NEUTROS ABS: 2.1 10*3/uL (ref 1.7–7.7)
Neutrophils Relative %: 43 %

## 2016-12-14 LAB — PROTIME-INR
INR: 1.04
Prothrombin Time: 13.7 seconds (ref 11.4–15.2)

## 2016-12-14 LAB — TROPONIN I
Troponin I: 0.13 ng/mL (ref <0.03)
Troponin I: 0.13 ng/mL (ref <0.03)

## 2016-12-14 LAB — APTT: APTT: 31 s (ref 24–36)

## 2016-12-14 LAB — I-STAT TROPONIN, ED: Troponin i, poc: 0.18 ng/mL (ref 0.00–0.08)

## 2016-12-14 LAB — CBG MONITORING, ED: GLUCOSE-CAPILLARY: 95 mg/dL (ref 65–99)

## 2016-12-14 LAB — VITAMIN B12: VITAMIN B 12: 404 pg/mL (ref 180–914)

## 2016-12-14 LAB — TSH: TSH: 4.386 u[IU]/mL (ref 0.350–4.500)

## 2016-12-14 LAB — HEPATIC FUNCTION PANEL: BILIRUBIN, TOTAL: 0.9 mg/dL

## 2016-12-14 MED ORDER — IOPAMIDOL (ISOVUE-370) INJECTION 76%
INTRAVENOUS | Status: AC
  Administered 2016-12-14: 50 mL
  Filled 2016-12-14: qty 50

## 2016-12-14 MED ORDER — ACETAMINOPHEN 650 MG RE SUPP: 650.0000 mg | RECTAL | Status: DC | PRN

## 2016-12-14 MED ORDER — SODIUM CHLORIDE 0.9 % IV BOLUS (SEPSIS)
500.0000 mL | Freq: Once | INTRAVENOUS | Status: AC
  Administered 2016-12-14: 500 mL via INTRAVENOUS

## 2016-12-14 MED ORDER — CLOPIDOGREL BISULFATE 75 MG PO TABS
75.0000 mg | ORAL_TABLET | Freq: Every day | ORAL | Status: DC
  Administered 2016-12-14 – 2016-12-18 (×5): 75 mg via ORAL
  Filled 2016-12-14 (×5): qty 1

## 2016-12-14 MED ORDER — VITAMIN B-12 1000 MCG PO TABS
1000.0000 ug | ORAL_TABLET | Freq: Every day | ORAL | Status: DC
  Administered 2016-12-15 – 2016-12-18 (×4): 1000 ug via ORAL
  Filled 2016-12-14 (×4): qty 1

## 2016-12-14 MED ORDER — ONDANSETRON HCL 4 MG/2ML IJ SOLN
4.0000 mg | Freq: Once | INTRAMUSCULAR | Status: AC
  Administered 2016-12-14: 4 mg via INTRAVENOUS
  Filled 2016-12-14: qty 2

## 2016-12-14 MED ORDER — SODIUM CHLORIDE 0.9 % IV SOLN: INTRAVENOUS | Status: DC

## 2016-12-14 MED ORDER — ONDANSETRON HCL 4 MG PO TABS: 4.0000 mg | ORAL_TABLET | Freq: Four times a day (QID) | ORAL | Status: DC | PRN

## 2016-12-14 MED ORDER — POLYVINYL ALCOHOL 1.4 % OP SOLN
1.0000 [drp] | OPHTHALMIC | Status: DC | PRN
  Filled 2016-12-14: qty 15

## 2016-12-14 MED ORDER — ACETAMINOPHEN 325 MG PO TABS: 650.0000 mg | ORAL_TABLET | ORAL | Status: DC | PRN

## 2016-12-14 MED ORDER — STROKE: EARLY STAGES OF RECOVERY BOOK
Freq: Once | Status: DC
Start: 2016-12-14 — End: 2016-12-18
  Filled 2016-12-14: qty 1

## 2016-12-14 MED ORDER — ONDANSETRON HCL 4 MG/2ML IJ SOLN
4.0000 mg | Freq: Four times a day (QID) | INTRAMUSCULAR | Status: DC | PRN
  Filled 2016-12-14: qty 2

## 2016-12-14 MED ORDER — ASPIRIN 81 MG PO CHEW
324.0000 mg | CHEWABLE_TABLET | Freq: Once | ORAL | Status: AC
Start: 2016-12-14 — End: 2016-12-14
  Administered 2016-12-14: 324 mg via ORAL
  Filled 2016-12-14: qty 4

## 2016-12-14 MED ORDER — ENOXAPARIN SODIUM 30 MG/0.3ML ~~LOC~~ SOLN
30.0000 mg | SUBCUTANEOUS | Status: DC
  Administered 2016-12-15 – 2016-12-17 (×2): 30 mg via SUBCUTANEOUS
  Filled 2016-12-14 (×3): qty 0.3

## 2016-12-14 MED ORDER — SENNOSIDES-DOCUSATE SODIUM 8.6-50 MG PO TABS
1.0000 | ORAL_TABLET | Freq: Every evening | ORAL | Status: DC | PRN
  Filled 2016-12-14: qty 1

## 2016-12-14 MED ORDER — ACETAMINOPHEN 160 MG/5ML PO SOLN: 650.0000 mg | ORAL | Status: DC | PRN

## 2016-12-14 MED ORDER — ASPIRIN EC 81 MG PO TBEC
81.0000 mg | DELAYED_RELEASE_TABLET | Freq: Every day | ORAL | Status: DC
  Administered 2016-12-14 – 2016-12-18 (×5): 81 mg via ORAL
  Filled 2016-12-14 (×5): qty 1

## 2016-12-14 MED ORDER — VITAMIN D 1000 UNITS PO TABS
2000.0000 [IU] | ORAL_TABLET | Freq: Every day | ORAL | Status: DC
  Administered 2016-12-15 – 2016-12-18 (×4): 2000 [IU] via ORAL
  Filled 2016-12-14 (×4): qty 2

## 2016-12-14 MED ORDER — POLYETHYL GLYCOL-PROPYL GLYCOL 0.4-0.3 % OP GEL: Freq: Two times a day (BID) | OPHTHALMIC | Status: DC | PRN

## 2016-12-14 NOTE — ED Provider Notes (Signed)
[facility] DEPT Provider Note   CSN: [id number] Arrival date & time: 12/14/16  0907     History   Chief Complaint Chief Complaint  Patient presents with  . Cerebrovascular Accident    HPI Stephanie Cochran is a 83 y.o. female.  She presents for evaluation of sudden onset of weakness, as she was walking to the bathroom this morning.  Her daughter last saw her yesterday, and at that time she was her normal self.  Patient was also seen to have garbled speech, weakness, near fall, while walking with her walker.  The patient is unable to give any history.  Her daughter called EMS and she was transferred here.  Apparently during transport her mentation improved, and she is talking normally however not completely back to baseline according to her daughter.  Level 5 caveat-altered mental status    HPI  Past Medical History:  Diagnosis Date  . Aortic stenosis    a. s/p bioprosthetic AVR 2007 [facility]);  b. Echo (04/2009 - [facility] in [place], [place]):  Mild LVH, normal LVF, anterior WMA, AVR ok (mean 4 mmHg), MAC, mild MR, mild TV stenosis, RVSP 44 mmHg  . Arthritis   . Atrial flutter ([place])    post operative  . Breast cancer ([place])    left mastectomy  . CAD (coronary artery disease)    s/p CABG 2007 at [facility]  . Diverticulosis    history  . HLD (hyperlipidemia)   . HTN (hypertension)   . Hx of echocardiogram    Echo (8/15):  Mild focal basal septal hypertrophy, EF 60-65%, no RWMA, AVR ok (mean 8 mmHg), MAC, mild MS (mean 4 mmHg), mod MR, mild LAE, PASP 54 mmHg  . Mobitz II    a. s/p STJ dual chamber PPM 2015  . Paroxysmal atrial fibrillation (HCC)   . Post-menopausal   . PVD (peripheral vascular disease) [facility])     Patient Active Problem List   Diagnosis Date Noted  . CHF (congestive heart failure) ([place]) 12/09/2015  . Risk for falls 12/09/2015  . History of recent fall 12/09/2015  . Complete heart block ([place]) 05/13/2014  . AV block, 2nd degree 05/13/2014  . Atherosclerosis  of renal artery ([place]) 01/04/2012  . Lower extremity edema 11/11/2011  . Coronary artery disease 11/11/2011  . History of aortic valve replacement with bioprosthetic valve 11/11/2011  . History of renal artery stenosis 11/11/2011  . Hypertension 11/11/2011  . Hyperlipidemia 11/11/2011    Past Surgical History:  Procedure Laterality Date  . AORTIC VALVE REPLACEMENT  2007   [facility]   . ARTERIOVENOUS GRAFT PLACEMENT W/ ENDOSCOPIC VEIN HARVEST Left    left leg  . BREAST SURGERY    . CORONARY ARTERY BYPASS GRAFT Left 2007   x3, left internal mammary artery to the left anterior descending, saphenous vein graft tot he circumflex coronary  . EYE SURGERY    . HEMORROIDECTOMY    . MASTECTOMY Left 2008   left, breast cancer, followed by a hematology oncologist  . [place] 05/14/2014   SJM Assurity DR implanted by Dr ARayann Hemanfor complete heart block  . RENAL ARTERY STENT  2006, 2010  . TONSILLECTOMY  1943    OB History    No data available       Home Medications    Prior to Admission medications   Medication Sig Start Date End Date Taking? Authorizing Provider  amLODipine (NORVASC) 2.5 MG tablet Take 2.5  mg by mouth daily. 02/11/16  Yes Historical Provider, MD  aspirin EC 81 MG tablet Take 81 mg by mouth daily.   Yes Historical Provider, MD  carvedilol (COREG) 6.25 MG tablet Take 1 tablet (6.25 mg total) by mouth daily. 09/03/15  Yes Dorothy Spark, MD  Cholecalciferol (VITAMIN D) 2000 UNITS tablet Take 2,000 Units by mouth daily.   Yes Historical Provider, MD  clopidogrel (PLAVIX) 75 MG tablet Take 75 mg by mouth daily.   Yes Historical Provider, MD  Cyanocobalamin (B-12) 1000 MCG CAPS Take 1,000 mcg by mouth daily.    Yes Historical Provider, MD  diphenhydramine-acetaminophen (TYLENOL PM) 25-500 MG TABS Take 0.5 tablets by mouth at bedtime as needed (sleep).    Yes Historical Provider, MD  lidocaine (LIDODERM) 5 %  Place 1 patch onto the skin daily. Remove & Discard patch within 12 hours or as directed by MD   Yes Historical Provider, MD  Liniments (SALONPAS PAIN RELIEF PATCH EX) Apply 1 patch topically daily as needed.   Yes Historical Provider, MD  ondansetron (ZOFRAN ODT) 4 MG disintegrating tablet Take 1 tablet (4 mg total) by mouth every 8 (eight) hours as needed for nausea or vomiting. 11/30/16  Yes Carmin Muskrat, MD  Polyethyl Glycol-Propyl Glycol (SYSTANE OP) Place 1 drop into both eyes 2 (two) times daily as needed (dry eyes).   Yes Historical Provider, MD  vitamin C (ASCORBIC ACID) 500 MG tablet Take 1,000 mg by mouth daily.   Yes Historical Provider, MD  Vitamin D, Ergocalciferol, (DRISDOL) 50000 UNITS CAPS capsule Take 1 capsule (50,000 Units total) by mouth every 30 (thirty) days. On or about the 1st of each month 10/03/14  Yes Dorothy Spark, MD  vitamin E 400 UNIT capsule Take 400 Units by mouth daily. Reported on 12/12/2015   Yes Historical Provider, MD  rosuvastatin (CRESTOR) 5 MG tablet Take 5 mg by mouth daily.    Historical Provider, MD    Family History Family History  Problem Relation Age of Onset  . Colon cancer Sister   . Diabetes    . Heart disease    . Arthritis      Social History Social History  Substance Use Topics  . Smoking status: Former Smoker    Quit date: 09/27/1960  . Smokeless tobacco: Never Used  . Alcohol use No     Allergies   Codeine; Penicillins; and Vicodin [hydrocodone-acetaminophen]   Review of Systems Review of Systems  Unable to perform ROS: Mental status change     Physical Exam Updated Vital Signs BP (!) 178/98   Pulse (!) 135   Temp (!) 96.9 F (36.1 C) (Rectal)   Resp (!) 26   Ht 5' (1.524 m)   Wt 112 lb (50.8 kg)   SpO2 91%   BMI 21.87 kg/m   Physical Exam  Constitutional: She is oriented to person, place, and time. She appears well-developed and well-nourished.  HENT:  Head: Normocephalic and atraumatic.  Eyes:  Conjunctivae and EOM are normal. Pupils are equal, round, and reactive to light.  Neck: Normal range of motion and phonation normal. Neck supple.  Cardiovascular: Normal rate and regular rhythm.   Pulmonary/Chest: Effort normal and breath sounds normal. She exhibits no tenderness.  Abdominal: Soft. She exhibits no distension. There is no tenderness. There is no guarding.  Musculoskeletal: Normal range of motion.  Neurological: She is alert and oriented to person, place, and time. She exhibits normal muscle tone.  Skin: Skin is warm  and dry.  Psychiatric: She has a normal mood and affect. Her behavior is normal. Judgment and thought content normal.  Nursing note and vitals reviewed.    ED Treatments / Results  Labs (all labs ordered are listed, but only abnormal results are displayed) Labs Reviewed  CBC - Abnormal; Notable for the following:       Result Value   RBC 3.45 (*)    Hemoglobin 11.4 (*)    HCT 33.9 (*)    All other components within normal limits  COMPREHENSIVE METABOLIC PANEL - Abnormal; Notable for the following:    Glucose, Bld 111 (*)    Calcium 8.8 (*)    Total Protein 6.2 (*)    ALT 7 (*)    GFR calc non Af Amer 50 (*)    GFR calc Af Amer 58 (*)    All other components within normal limits  URINALYSIS, ROUTINE W REFLEX MICROSCOPIC - Abnormal; Notable for the following:    Color, Urine STRAW (*)    All other components within normal limits  I-STAT TROPOININ, ED - Abnormal; Notable for the following:    Troponin i, poc 0.18 (*)    All other components within normal limits  I-STAT CHEM 8, ED - Abnormal; Notable for the following:    Glucose, Bld 107 (*)    Hemoglobin 11.2 (*)    HCT 33.0 (*)    All other components within normal limits  PROTIME-INR  APTT  DIFFERENTIAL  CBG MONITORING, ED    EKG  EKG Interpretation  Date/Time:  Tuesday December 14 2016 09:14:17 EDT Ventricular Rate:  63 PR Interval:    QRS Duration: 174 QT Interval:  517 QTC  Calculation: 530 R Axis:   -64 Text Interpretation:  Electronic ventricular pacemaker Confirmed by Eulis Foster  MD, Vira Agar ([id number]) on 12/14/2016 9:56:08 AM       Radiology Ct Head Wo Contrast  Result Date: 12/14/2016 CLINICAL DATA:  Resolved stroke symptoms EXAM: CT HEAD WITHOUT CONTRAST TECHNIQUE: Contiguous axial images were obtained from the base of the skull through the vertex without intravenous contrast. COMPARISON:  None. FINDINGS: Brain: No evidence of acute infarction, hemorrhage, hydrocephalus, extra-axial collection or mass lesion/mass effect. Global cortical atrophy. Right basal ganglia lacunar infarct. Subcortical white matter and periventricular small vessel ischemic changes. Vascular: Intracranial atherosclerosis. Skull: Normal. Negative for fracture or focal lesion. Sinuses/Orbits: The visualized paranasal sinuses are essentially clear. The mastoid air cells are unopacified. Other: None. IMPRESSION: No evidence of acute intracranial abnormality. Atrophy with small vessel ischemic changes. Old right basal ganglia lacunar infarct. Electronically Signed   By: Julian Hy M.D.   On: 12/14/2016 11:07    Procedures Procedures (including critical care time)  Medications Ordered in ED Medications  ondansetron (ZOFRAN) injection 4 mg (4 mg Intravenous Given 12/14/16 1112)  sodium chloride 0.9 % bolus 500 mL (500 mLs Intravenous New Bag/Given 12/14/16 1112)  aspirin chewable tablet 324 mg (324 mg Oral Given 12/14/16 1111)     Initial Impression / Assessment and Plan / ED Course  I have reviewed the triage vital signs and the nursing notes.  Pertinent labs & imaging results that were available during my care of the patient were reviewed by me and considered in my medical decision making (see chart for details).  Clinical Course as of Dec 14 1225  Tue Dec 14, 2016  1038 Patient reportedly had sudden onset of weakness associated with near fall, and garbled speech, and slumped over,  when her daughter  found her in the bathroom this morning.  Since that time she has recovered speech but has not recovered normal mentation.  No other recent problems.  Patient is fairly independent at home typically.  Patient's only complaint is "sick".  [EW]  1204 High Troponin i, poc: (!!) 0.18 [EW]  1214 Case discussed with cardiology regarding her pacemaker, and symptoms today.  They will evaluate the patient, as a Optometrist and agree with admission by medicine  [EW]    Clinical Course User Index [EW] Daleen Bo, MD    Medications  ondansetron [facility]) injection 4 mg (4 mg Intravenous Given 12/14/16 1112)  sodium chloride 0.9 % bolus 500 mL (500 mLs Intravenous New Bag/Given 12/14/16 1112)  aspirin chewable tablet 324 mg (324 mg Oral Given 12/14/16 1111)    Patient Vitals for the past 24 hrs:  BP Temp Temp src Pulse Resp SpO2 Height Weight  12/14/16 1200 (!) 178/98 - - (!) 135 (!) 26 91 % - -  12/14/16 1108 - (!) 96.9 F (36.1 C) Rectal - - - - -  12/14/16 1000 (!) 185/67 - - 65 15 95 % - -  12/14/16 0922 - - - - - - 5' (1.524 m) 112 lb (50.8 kg)  12/14/16 0921 (!) 191/76 (!) 96.7 F (35.9 C) Axillary 66 12 98 % - -  12/14/16 0915 (!) 193/74 - - 66 13 97 % - -    12:19 PM Reevaluation with update and discussion. After initial assessment and treatment, an updated evaluation reveals that this time the patient states that she feels tired but is not "sick". Aadya Kindler L    12:19 PM-Consult complete with hospitalist. Patient case explained and discussed.  She agrees to admit patient for further evaluation and treatment. Call ended at 12: 3 0   Final Clinical Impressions(s) / ED Diagnoses   Final diagnoses:  Weakness  Elevated troponin  Confusion    Transient weakness with garbled speech, and confusion, slowly improving.  Head CT negative for acute CVA.  Patient has a cardiac pacemaker, therefore cannot get MRI immediately.  Patient will be admitted, to medical service with  cardiology consultation because of elevated troponin.  EKG does not show STEMI.  She was treated with aspirin.  She is not having ongoing chest pain.  Etiology of the elevated troponin, is not clear.  Nursing Notes Reviewed/ Care Coordinated Applicable Imaging Reviewed Interpretation of Laboratory Data incorporated into ED treatment  Plan: Admit   New Prescriptions New Prescriptions   No medications on file     Daleen Bo, MD 12/14/16 1232

## 2016-12-14 NOTE — ED Triage Notes (Signed)
Pt. Went to bed last night and was her normal status.  She lives by herself, alert and oriented X4.  She woke up this am and went to the bathroom herself and her daughter is visiting and heard her get up an start walking to the bathroom,  Her daughter went into the bathroom and found her unable to talk and her head hanging down.  She helped her to the toilet and saw a rt. Facial droop and pt. Was moaning.  Skin is warm and dry.   Pt. Is talking at the present time asking what is wrong with her, speech is clear.  She is alert and oriented to self but not time or place.  She takes Plavix. Family is at the bedside.  Airway intact.

## 2016-12-14 NOTE — ED Notes (Signed)
Pt placed on bedpan

## 2016-12-14 NOTE — ED Notes (Signed)
Call to floor to ask if they want to take report over phone or if RN should bring patient up for bedside report. Stephanie Cochran to transport patient to 9M and give bedside report.

## 2016-12-14 NOTE — Consult Note (Signed)
CARDIOLOGY CONSULT NOTE   Patient ID: Stephanie Cochran MRN: [id number] DOB/AGE: 1918-02-13 83 y.o.  Admit date: 12/14/2016  Requesting Physician: Dr. Christ Kick, ER Physician Primary Physician:   Cari Caraway, MD Primary Cardiologist:   Dr. Liane Comber and [name] MD Reason for Consultation:   Elevated Troponin  DNR  HPI: Stephanie Cochran is a 83 y.o. female with a past medical history significant for CAD (s/p CABG 2007 at [facility]), renal artery stenosis (s/p stenting), hyperlipidemia, atrial fibrillation (post CABG), prior AVR, and breast cancer (s/p left mastectomy).Echo 05-13-14 demonstrated EF 60-65%, no RWMA, mild mitral stenosis, moderate MR, LA 45. Aortic stenosis s/p bioprosthetic AVR 2007 [facility]),  Echo (04/2009 - [facility] in [place], [place]):  Mild LVH, normal LVF, anterior WMA, AVR ok (mean 4 mmHg), MAC, mild MR, mild TV stenosis, RVSP 44 mmHg. She was evaluated by Dr Rayann Heman with EP who recommended pacemaker implantation for 2:1 heart block she underwent implantation of a STJ dual chamber pacemaker. Also has hx of Hypertension, CAD, PVD, diverticulosis, HLD.  She presented to the ER after sudden onset of weakness while walking to the bathroom this morning. Last seen normal yesterday.  She had garbled speech, weakness, near fall while walking with walker and EMS brought her in. On arrival to ED mentation had improved, but not back to baseline. As weakness was transient code stroke was not called. Head CT neg for acute CVA, because of dual pacemaker- she is unable to have MRI per [employer] rep Aaron Edelman).  Pt denies having had any chest pain or SOB, EKG shows ventricular paced rhythm, initial Troponin is elevated to 0.18 and therefore cardiology has been asked to consult and medicine is admitting for TIA.  Level V caveat- pt does not remember what happened today and answers all questions with "I was fine and then I wasn't". Daughter and niece who are present denies that she has  been having CP or SOB recently       Past Medical History:  Diagnosis Date  . Aortic stenosis    a. s/p bioprosthetic AVR 2007 [facility]);  b. Echo (04/2009 - [facility] in [place], [place]):  Mild LVH, normal LVF, anterior WMA, AVR ok (mean 4 mmHg), MAC, mild MR, mild TV stenosis, RVSP 44 mmHg  . Arthritis   . Atrial flutter ([place])    post operative  . Breast cancer ([place])    left mastectomy  . CAD (coronary artery disease)    s/p CABG 2007 at [facility]  . Diverticulosis    history  . HLD (hyperlipidemia)   . HTN (hypertension)   . Hx of echocardiogram    Echo (8/15):  Mild focal basal septal hypertrophy, EF 60-65%, no RWMA, AVR ok (mean 8 mmHg), MAC, mild MS (mean 4 mmHg), mod MR, mild LAE, PASP 54 mmHg  . Mobitz II    a. s/p STJ dual chamber PPM 2015  . Paroxysmal atrial fibrillation (HCC)   . Post-menopausal   . PVD (peripheral vascular disease) ([place])      Past Surgical History:  Procedure Laterality Date  . AORTIC VALVE REPLACEMENT  2007   [facility]   . ARTERIOVENOUS GRAFT PLACEMENT W/ ENDOSCOPIC VEIN HARVEST Left    left leg  . BREAST SURGERY    . CORONARY ARTERY BYPASS GRAFT Left 2007   x3, left internal mammary artery to the left anterior descending, saphenous vein graft tot he circumflex coronary  .  EYE SURGERY    . HEMORROIDECTOMY    . MASTECTOMY Left 2008   left, breast cancer, followed by a hematology oncologist  . [place] N/A 05/14/2014   SJM Assurity DR implanted by Dr Rayann Heman for complete heart block  . RENAL ARTERY STENT  2006, 2010  . TONSILLECTOMY  1943    Allergies  Allergen Reactions  . Codeine Nausea And Vomiting  . Penicillins Nausea And Vomiting  . Vicodin [Hydrocodone-Acetaminophen] Nausea And Vomiting    I have reviewed the patient's current medications     Prior to Admission medications   Medication Sig Start Date End Date Taking? Authorizing Provider  amLODipine (NORVASC) 2.5 MG  tablet Take 2.5 mg by mouth daily. 02/11/16  Yes Historical Provider, MD  aspirin EC 81 MG tablet Take 81 mg by mouth daily.   Yes Historical Provider, MD  carvedilol (COREG) 6.25 MG tablet Take 1 tablet (6.25 mg total) by mouth daily. 09/03/15  Yes Dorothy Spark, MD  Cholecalciferol (VITAMIN D) 2000 UNITS tablet Take 2,000 Units by mouth daily.   Yes Historical Provider, MD  clopidogrel (PLAVIX) 75 MG tablet Take 75 mg by mouth daily.   Yes Historical Provider, MD  Cyanocobalamin (B-12) 1000 MCG CAPS Take 1,000 mcg by mouth daily.    Yes Historical Provider, MD  diphenhydramine-acetaminophen (TYLENOL PM) 25-500 MG TABS Take 0.5 tablets by mouth at bedtime as needed (sleep).    Yes Historical Provider, MD  lidocaine (LIDODERM) 5 % Place 1 patch onto the skin daily. Remove & Discard patch within 12 hours or as directed by MD   Yes Historical Provider, MD  Liniments (SALONPAS PAIN RELIEF PATCH EX) Apply 1 patch topically daily as needed.   Yes Historical Provider, MD  ondansetron (ZOFRAN ODT) 4 MG disintegrating tablet Take 1 tablet (4 mg total) by mouth every 8 (eight) hours as needed for nausea or vomiting. 11/30/16  Yes Carmin Muskrat, MD  Polyethyl Glycol-Propyl Glycol (SYSTANE OP) Place 1 drop into both eyes 2 (two) times daily as needed (dry eyes).   Yes Historical Provider, MD  vitamin C (ASCORBIC ACID) 500 MG tablet Take 1,000 mg by mouth daily.   Yes Historical Provider, MD  Vitamin D, Ergocalciferol, (DRISDOL) 50000 UNITS CAPS capsule Take 1 capsule (50,000 Units total) by mouth every 30 (thirty) days. On or about the 1st of each month 10/03/14  Yes Dorothy Spark, MD  vitamin E 400 UNIT capsule Take 400 Units by mouth daily. Reported on 12/12/2015   Yes Historical Provider, MD  rosuvastatin (CRESTOR) 5 MG tablet Take 5 mg by mouth daily.    Historical Provider, MD     Social History   Social History  . Marital status: Widowed    Spouse name: N/A  . Number of children: N/A  . Years of  education: N/A   Occupational History  . Not on file.   Social History Main Topics  . Smoking status: Former Smoker    Quit date: 09/27/1960  . Smokeless tobacco: Never Used  . Alcohol use No  . Drug use: No  . Sexual activity: Not on file   Other Topics Concern  . Not on file   Social History Narrative  . No narrative on file    Family Status  Relation Status  . Mother Deceased  . Father Deceased  . Paternal Grandfather Deceased  . Paternal Grandmother Deceased  . Maternal Grandfather Deceased  . Maternal Grandmother Deceased  . Sister   .    Marland Kitchen    Marland Kitchen  Family History  Problem Relation Age of Onset  . Colon cancer Sister   . Diabetes    . Heart disease    . Arthritis            ROS:  Full 14 point review of systems complete and found to be negative unless listed above.  Physical Exam: Blood pressure (!) 168/67, pulse 65, temperature 97.5 F (36.4 C), temperature source Oral, resp. rate 14, height 5' (1.524 m), weight 112 lb (50.8 kg), SpO2 98 %.  General: Well developed, frail, female that is confused Head:  Normocephalic and atraumatic, Lungs: normal effort and rate of breathing. Heart:: normal rate and rhythm Neck: No carotid bruits. No lymphadenopathy. Abdomen: Bowel sounds present, abdomen soft and non-tender Msk:  Spontaneously moves all 4 extremities, tenderness to left arm Extremities: No clubbing or cyanosis.   Neuro: pt is confused, to person, place and time. Global confusion Psych:  Confused and frusterated Skin: No rashes or lesions noted.     Labs:  Lab Results  Component Value Date   WBC 4.8 12/14/2016   HGB 11.2 (L) 12/14/2016   HCT 33.0 (L) 12/14/2016   MCV 98.3 12/14/2016   PLT 224 12/14/2016    Recent Labs  12/14/16 0932  INR 1.04    Recent Labs Lab 12/14/16 0932 12/14/16 0943  NA 136 137  K 4.3 4.2  CL 102 102  CO2 26  --   BUN 15 17  CREATININE 0.93 1.00  CALCIUM 8.8*  --   PROT 6.2*  --   BILITOT 0.9  --     ALKPHOS 40  --   ALT 7*  --   AST 23  --   GLUCOSE 111* 107*  ALBUMIN 3.5  --     Recent Labs  12/14/16 0941  TROPIPOC 0.18*    Lipase  Date/Time Value Ref Range Status  11/30/2016 01:04 PM 24 11 - 51 U/L Final    Echo   Transthoracic Echocardiography 05/13/2014 Study Conclusions  - Left ventricle: E/e&'>16.2 suggestive of elevated LV filling pressure. The cavity size was normal. There was mild focal basal hypertrophy of the septum. Systolic function was normal. The estimated ejection fraction was in the range of 60% to 65%. Wall motion was normal; there were no regional wall motion abnormalities. - Aortic valve: A bioprosthesis was present. - Mitral valve: Severely calcified annulus. Mildly thickened leaflets . Mild thickening and calcification, with mild involvement of chords. The findings are consistent with mild stenosis. There was moderate regurgitation. - Left atrium: The atrium was mildly dilated. - Pulmonary arteries: PA peak pressure: 54 mm Hg (S).   ECG:     Ventricular paced Rhythm, HR 63- personally reviewed.   Radiology: Ct Head Wo Contrast Result Date: 12/14/2016 No evidence of acute intracranial abnormality. Atrophy with small vessel ischemic changes. Old right basal ganglia lacunar infarct. Electronically Signed   By: [place]:      TIA vs Stroke: Medicine to admit for further evaluation  Elevated Troponin:   --  Troponin mildly elevated, not likely cardiac.We will trend troponins. --  Due to patients age and presentation we do not plan for any further intervention or evaluation at this time.    SignedLinus Mako, PA-C 12/14/2016 1:45 PM   As above, patient seen and examined. Briefly she is a 83 year old female with past medical history of coronary artery disease status post coronary artery bypass graft, renal  artery stenosis status post prior stenting, hyperlipidemia, prior aortic  valve replacement, breast cancer for evaluation of elevated troponin. Last echocardiogram August 2015 showed ejection fraction 60-65%, mild mitral stenosis, moderate mitral regurgitation and prior aortic valve replacement. She has had previous pacemaker placed for 2-1 AV block. Patient has apparently had declining memory for 6 months but otherwise her mental status is good per her daughter. She was walking to the bathroom today and developed altered mental status/confusion. There apparently was no increased chest pain, dyspnea, palpitations and no syncope. In the emergency room the patient is extremely confused. She knows her first name but not last. She is not oriented to place or time. History is obtained through daughter. Head CT shows old right basilar lacunar infarct. Troponin 0.18. Hemoglobin 11.2. Electrocardiogram shows sinus with ventricular pacing.  1 elevated troponin-etiology unclear. She is not having chest pain. I doubt this is significant. We can cycle enzymes further to look for trend. I do not anticipate further ischemia evaluation particularly given patient's age.   2 new onset confusion-this is the predominant issue. Question CVA. I will leave this evaluation to primary care.  3 history of paroxysmal atrial fibrillation-patient in sinus rhythm. Per Dr. Francesca Oman last office note she is not a candidate for anticoagulation given her age and falls. She did fall 3 weeks ago injuring her left shoulder. Continue carvedilol.  4 hypertension-continue preadmission medications. Can advance amlodipine as needed.  5 coronary artery disease-continue aspirin and statin.  6 prior aortic valve replacement  Kirk Ruths, MD

## 2016-12-14 NOTE — H&P (Signed)
[facility] History and Physical  Stephanie Cochran CZY:[id number] DOB: Nov 27, 1917 DOA: 12/14/2016  Referring physician: Dr Eulis Foster, MD PCP: Cari Caraway, MD  Cardiologist: Ena Dawley, MD  Chief Complaint: facial droop, confusion  HPI: Stephanie Cochran is a 83 y.o. female with hx of heart block s/p pacemaker insertion, CAD s/p CABG, renal artery stenosis s/p stenting, hyperlipidemia, hx AVR, breast ca s/p mastectomy. Patient presents to ED with confusion and weakness. Daughter reports gradual decline over the last 3-4 months with visit to ED in early March after a fall at home. Since that time, daughter has been staying in town with pt who lives alone and had been independent of ADLs as early as December. Daughter reports pt awoke at 0800 this am to use the restroom. Daughter walked into bathroom and found pt standing with her walker confused and mumbling. Daughter reports facial droop. She denies any bowel or bladder incontinence. On EMS arrival to home, pt was nonverbal. On arrival to ED, speech has returned to normal and facial droop resolved though pt remains quite confused.  Daughter reports decreased appetite since fall in early March though this was starting to improve. Pt has chronic dyspnea on exertion that may be worse over the last few days. There have been no complaints of headache, dizziness, fever, cough, chest pain, abdominal pain, n/v/d, melena or hematochezia. No new medications. Pt was taken off Crestor by cardiology last month.   ED COURSE Again, facial droop has resolved though pt remains quite confused on exam. Lab works mostly unremarkable aside from mild anemia with Hgb 11.4. Troponin poc is slightly elevated at 0.18. CT head shows old right basal ganglia lacunar infarct and small vessel ischemia changes, but no acute findings.   Review of Systems:  As stated in hpi, otherwise negative   Past Medical History:  Diagnosis Date  . Aortic stenosis    a. s/p  bioprosthetic AVR 2007 [facility]);  b. Echo (04/2009 - [facility] in [place], [place]):  Mild LVH, normal LVF, anterior WMA, AVR ok (mean 4 mmHg), MAC, mild MR, mild TV stenosis, RVSP 44 mmHg  . Arthritis   . Atrial flutter ([place])    post operative  . Breast cancer ([place])    left mastectomy  . CAD (coronary artery disease)    s/p CABG 2007 at [facility]  . Diverticulosis    history  . HLD (hyperlipidemia)   . HTN (hypertension)   . Hx of echocardiogram    Echo (8/15):  Mild focal basal septal hypertrophy, EF 60-65%, no RWMA, AVR ok (mean 8 mmHg), MAC, mild MS (mean 4 mmHg), mod MR, mild LAE, PASP 54 mmHg  . Mobitz II    a. s/p STJ dual chamber PPM 2015  . Paroxysmal atrial fibrillation (HCC)   . Post-menopausal   . PVD (peripheral vascular disease) ([place])     Social History:  reports that she quit smoking about 56 years ago. She has never used smokeless tobacco. She reports that she does not drink alcohol or use drugs.  Allergies  Allergen Reactions  . Codeine Nausea And Vomiting  . Penicillins Nausea And Vomiting  . Vicodin [Hydrocodone-Acetaminophen] Nausea And Vomiting    Family History  Problem Relation Age of Onset  . Colon cancer Sister   . Diabetes    . Heart disease    . Arthritis       Prior to Admission medications   Medication Sig Start Date End Date Taking? Authorizing Provider  amLODipine (NORVASC) 2.5 MG  tablet Take 2.5 mg by mouth daily. 02/11/16  Yes Historical Provider, MD  aspirin EC 81 MG tablet Take 81 mg by mouth daily.   Yes Historical Provider, MD  carvedilol (COREG) 6.25 MG tablet Take 1 tablet (6.25 mg total) by mouth daily. 09/03/15  Yes Dorothy Spark, MD  Cholecalciferol (VITAMIN D) 2000 UNITS tablet Take 2,000 Units by mouth daily.   Yes Historical Provider, MD  clopidogrel (PLAVIX) 75 MG tablet Take 75 mg by mouth daily.   Yes Historical Provider, MD  Cyanocobalamin (B-12) 1000 MCG CAPS Take 1,000 mcg by mouth daily.    Yes Historical Provider, MD    diphenhydramine-acetaminophen (TYLENOL PM) 25-500 MG TABS Take 0.5 tablets by mouth at bedtime as needed (sleep).    Yes Historical Provider, MD  lidocaine (LIDODERM) 5 % Place 1 patch onto the skin daily. Remove & Discard patch within 12 hours or as directed by MD   Yes Historical Provider, MD  Liniments (SALONPAS PAIN RELIEF PATCH EX) Apply 1 patch topically daily as needed.   Yes Historical Provider, MD  ondansetron (ZOFRAN ODT) 4 MG disintegrating tablet Take 1 tablet (4 mg total) by mouth every 8 (eight) hours as needed for nausea or vomiting. 11/30/16  Yes Carmin Muskrat, MD  Polyethyl Glycol-Propyl Glycol (SYSTANE OP) Place 1 drop into both eyes 2 (two) times daily as needed (dry eyes).   Yes Historical Provider, MD  vitamin C (ASCORBIC ACID) 500 MG tablet Take 1,000 mg by mouth daily.   Yes Historical Provider, MD  Vitamin D, Ergocalciferol, (DRISDOL) 50000 UNITS CAPS capsule Take 1 capsule (50,000 Units total) by mouth every 30 (thirty) days. On or about the 1st of each month 10/03/14  Yes Dorothy Spark, MD  vitamin E 400 UNIT capsule Take 400 Units by mouth daily. Reported on 12/12/2015   Yes Historical Provider, MD  rosuvastatin (CRESTOR) 5 MG tablet Take 5 mg by mouth daily.    Historical Provider, MD   Physical Exam: Vitals:   12/14/16 1000 12/14/16 1108 12/14/16 1200 12/14/16 1248  BP: (!) 185/67  (!) 178/98 (!) 168/67  Pulse: 65  (!) 135 65  Resp: 15  (!) 26 14  Temp:  (!) 96.9 F (36.1 C)  97.5 F (36.4 C)  TempSrc:  Rectal  Oral  SpO2: 95%  91% 98%  Weight:      Height:        Wt Readings from Last 3 Encounters:  12/14/16 50.8 kg (112 lb)  12/10/16 50.8 kg (112 lb)  11/30/16 50.8 kg (112 lb)    General: resting comfortably  Eyes: PERRL, EOMI, normal lids, iris ENT: grossly normal hearing, lips & tongue Neck: no LAD, masses or thyromegaly Cardiovascular: RRR c 2/6 SEM. No LE edema.  Respiratory: decreased effort, but CTA bilaterally. No increased wob, no  w/r/c Abdomen: soft, ntnd, BS+ Skin: no rash or induration seen on limited exam Musculoskeletal: grossly normal tone BUE/BLE Psychiatric: alert and oriented to name only, confused but cooperative Neurologic: No facial asymmetry, tongue is midline, limited exam as pt difficulty following commands, grips symmetric with 4/5 strength, DTRs symmetric troughout          Labs on Admission:  Basic Metabolic Panel:  Recent Labs Lab 12/14/16 0932 12/14/16 0943  NA 136 137  K 4.3 4.2  CL 102 102  CO2 26  --   GLUCOSE 111* 107*  BUN 15 17  CREATININE 0.93 1.00  CALCIUM 8.8*  --    Liver  Function Tests:  Recent Labs Lab 12/14/16 0932  AST 23  ALT 7*  ALKPHOS 40  BILITOT 0.9  PROT 6.2*  ALBUMIN 3.5   No results for input(s): LIPASE, AMYLASE in the last 168 hours. No results for input(s): AMMONIA in the last 168 hours. CBC:  Recent Labs Lab 12/14/16 0932 12/14/16 0943  WBC 4.8  --   NEUTROABS 2.1  --   HGB 11.4* 11.2*  HCT 33.9* 33.0*  MCV 98.3  --   PLT 224  --    Cardiac Enzymes: No results for input(s): CKTOTAL, CKMB, CKMBINDEX, TROPONINI in the last 168 hours.  BNP (last 3 results) No results for input(s): BNP in the last 8760 hours.  ProBNP (last 3 results) No results for input(s): PROBNP in the last 8760 hours.   Serum creatinine: 1 mg/dL 12/14/16 0943 Estimated creatinine clearance: 23.1 mL/min  CBG:  Recent Labs Lab 12/14/16 0933  GLUCAP 95    Radiological Exams on Admission: Ct Head Wo Contrast  Result Date: 12/14/2016 CLINICAL DATA:  Resolved stroke symptoms EXAM: CT HEAD WITHOUT CONTRAST TECHNIQUE: Contiguous axial images were obtained from the base of the skull through the vertex without intravenous contrast. COMPARISON:  None. FINDINGS: Brain: No evidence of acute infarction, hemorrhage, hydrocephalus, extra-axial collection or mass lesion/mass effect. Global cortical atrophy. Right basal ganglia lacunar infarct. Subcortical white matter and  periventricular small vessel ischemic changes. Vascular: Intracranial atherosclerosis. Skull: Normal. Negative for fracture or focal lesion. Sinuses/Orbits: The visualized paranasal sinuses are essentially clear. The mastoid air cells are unopacified. Other: None. IMPRESSION: No evidence of acute intracranial abnormality. Atrophy with small vessel ischemic changes. Old right basal ganglia lacunar infarct. Electronically Signed   By: Julian Hy M.D.   On: 12/14/2016 11:07    EKG: Independently reviewed. Paced rhythm 63bpm  Assessment/Plan Active Problems:   Coronary artery disease   Hypertension   Hyperlipidemia   Risk for falls   Near syncope   Acute encephalopathy   Elevated troponin   Anemia   Acute encephalopathy -unclear etiology. Concern for TIA vs CVA based on hx.  -Admit to telemetry, CT angio head and neck. Pt has pacemaker and, regardless, would not cooperate with MRI at this point. Check 2Decho r/o thrombus -Rule out metabolic cause, check TSH, B12. No evidence of infection with normal WBC, afebrile, u/a unremarkable. -Consider repeat CT head in 24-48 hours depending on how pt progresses  Elevated troponin poc with hx CAD -no cardiac complaints -will cycle cardiac enzymes  -pt on ASA pta  Hx of hypertension -will allow for some permissive htn with ? Of CVA -restart home Norvasc in am  Anemia, mild normocytic -chronic dz -Monitor  Old right basal ganglia lacunar infarct  -incidental finding on CT  -on ASA. There was a question as to whether Plavix was d/c'd during recent cardiology visit as pt wanted to simplify med regimen. According to daughter, pt continued to take until she could talk to PCP so will keep on for now  Hx Mobitz II HB s/p pacemaker implantation 04/2014  Hx pafib -sinus paced rhythm on exam -no anticoagulation due to high fall risk and advanced age per cardiology note 10/2016.  Hx renal artery stenosis -previously followed by [facility].  -renal  function WNL -will f/u on Plavix as mentioned above  Advanced age CSW consult as pt will likely need SNF at discharge   Code Status: DNR confirmed with daughter DVT Prophylaxis: SQ Lovenox Family Communication: daughter, [name] at bedside on admission  evaluation Disposition Plan: Pending Improvement    EASTERWOOD, Lynnell Jude, NP [phone] [facility] [email or web address] Password [email or web address]

## 2016-12-14 NOTE — Patient Outreach (Signed)
[place] [facility]) Care Management  12/14/2016  Stephanie Cochran 1918/03/15 [id number]   CSW was scheduled to contact patient and patient's daughter, Stephanie Cochran today to follow-up regarding home health services/private agency sitter services for patient; however, CSW noted that patient presented to the emergency department within the [facility] this morning.  Patient currently lives alone and care for herself independently; however, Stephanie Cochran has been staying with patient since release from previous hospitalization.  Stephanie Cochran found patient in the bathroom this morning, slumped over and unable to talk.  Patient is currently undergoing a series of tests. CSW will continue to follow patient while hospitalized to assess and assist, as needed.  CSW will plan to contact patient and [name] within the next week to proceed with providing social work services and resources. Nat Christen, BSW, MSW, LCSW  Licensed Education officer, [facility]  [address] [place] [address] [place], [place] [address] Physical Address-[address], [place], [place] [address] Toll Free Main # [phone] Fax # [phone] Cell # [phone]  Office # [phone] [email or web address]

## 2016-12-14 NOTE — ED Notes (Signed)
Patient transported to X-ray 

## 2016-12-14 NOTE — ED Notes (Signed)
First Attempt to call report.

## 2016-12-14 NOTE — ED Notes (Signed)
Fixed IV and redressed it. Cardiac PA at bedside

## 2016-12-15 ENCOUNTER — Ambulatory Visit: Payer: Medicare Other | Admitting: *Deleted

## 2016-12-15 ENCOUNTER — Encounter (HOSPITAL_COMMUNITY): Payer: Self-pay | Admitting: Cardiology

## 2016-12-15 ENCOUNTER — Observation Stay (HOSPITAL_BASED_OUTPATIENT_CLINIC_OR_DEPARTMENT_OTHER): Payer: Medicare Other

## 2016-12-15 ENCOUNTER — Observation Stay (HOSPITAL_COMMUNITY): Payer: Medicare Other

## 2016-12-15 ENCOUNTER — Ambulatory Visit: Payer: Self-pay

## 2016-12-15 DIAGNOSIS — M25512 Pain in left shoulder: Secondary | ICD-10-CM | POA: Diagnosis not present

## 2016-12-15 DIAGNOSIS — G459 Transient cerebral ischemic attack, unspecified: Secondary | ICD-10-CM

## 2016-12-15 DIAGNOSIS — F0390 Unspecified dementia without behavioral disturbance: Secondary | ICD-10-CM | POA: Diagnosis present

## 2016-12-15 DIAGNOSIS — Z9012 Acquired absence of left breast and nipple: Secondary | ICD-10-CM | POA: Diagnosis not present

## 2016-12-15 DIAGNOSIS — Z950 Presence of cardiac pacemaker: Secondary | ICD-10-CM | POA: Diagnosis not present

## 2016-12-15 DIAGNOSIS — I10 Essential (primary) hypertension: Secondary | ICD-10-CM | POA: Diagnosis not present

## 2016-12-15 DIAGNOSIS — Z833 Family history of diabetes mellitus: Secondary | ICD-10-CM | POA: Diagnosis not present

## 2016-12-15 DIAGNOSIS — Z951 Presence of aortocoronary bypass graft: Secondary | ICD-10-CM | POA: Diagnosis not present

## 2016-12-15 DIAGNOSIS — G8929 Other chronic pain: Secondary | ICD-10-CM

## 2016-12-15 DIAGNOSIS — I2510 Atherosclerotic heart disease of native coronary artery without angina pectoris: Secondary | ICD-10-CM | POA: Diagnosis present

## 2016-12-15 DIAGNOSIS — Z9181 History of falling: Secondary | ICD-10-CM | POA: Diagnosis not present

## 2016-12-15 DIAGNOSIS — Z955 Presence of coronary angioplasty implant and graft: Secondary | ICD-10-CM | POA: Diagnosis not present

## 2016-12-15 DIAGNOSIS — Z853 Personal history of malignant neoplasm of breast: Secondary | ICD-10-CM | POA: Diagnosis not present

## 2016-12-15 DIAGNOSIS — Z7982 Long term (current) use of aspirin: Secondary | ICD-10-CM | POA: Diagnosis not present

## 2016-12-15 DIAGNOSIS — E785 Hyperlipidemia, unspecified: Secondary | ICD-10-CM | POA: Diagnosis present

## 2016-12-15 DIAGNOSIS — I739 Peripheral vascular disease, unspecified: Secondary | ICD-10-CM | POA: Diagnosis present

## 2016-12-15 DIAGNOSIS — Z7902 Long term (current) use of antithrombotics/antiplatelets: Secondary | ICD-10-CM | POA: Diagnosis not present

## 2016-12-15 DIAGNOSIS — G9340 Encephalopathy, unspecified: Secondary | ICD-10-CM | POA: Diagnosis not present

## 2016-12-15 DIAGNOSIS — R627 Adult failure to thrive: Secondary | ICD-10-CM | POA: Diagnosis present

## 2016-12-15 DIAGNOSIS — E784 Other hyperlipidemia: Secondary | ICD-10-CM | POA: Diagnosis not present

## 2016-12-15 DIAGNOSIS — I480 Paroxysmal atrial fibrillation: Secondary | ICD-10-CM | POA: Diagnosis present

## 2016-12-15 DIAGNOSIS — I639 Cerebral infarction, unspecified: Secondary | ICD-10-CM | POA: Diagnosis present

## 2016-12-15 DIAGNOSIS — R748 Abnormal levels of other serum enzymes: Secondary | ICD-10-CM | POA: Diagnosis not present

## 2016-12-15 DIAGNOSIS — Z87891 Personal history of nicotine dependence: Secondary | ICD-10-CM | POA: Diagnosis not present

## 2016-12-15 DIAGNOSIS — Z66 Do not resuscitate: Secondary | ICD-10-CM | POA: Diagnosis present

## 2016-12-15 DIAGNOSIS — I110 Hypertensive heart disease with heart failure: Secondary | ICD-10-CM | POA: Diagnosis present

## 2016-12-15 DIAGNOSIS — R410 Disorientation, unspecified: Secondary | ICD-10-CM | POA: Diagnosis present

## 2016-12-15 DIAGNOSIS — D649 Anemia, unspecified: Secondary | ICD-10-CM | POA: Diagnosis present

## 2016-12-15 DIAGNOSIS — I509 Heart failure, unspecified: Secondary | ICD-10-CM | POA: Diagnosis present

## 2016-12-15 DIAGNOSIS — Z953 Presence of xenogenic heart valve: Secondary | ICD-10-CM | POA: Diagnosis not present

## 2016-12-15 LAB — ECHOCARDIOGRAM COMPLETE
AO mean calculated velocity dopler: 90.4 cm/s
AOPV: 0.56 m/s
AOVTI: 26 cm
AV Area VTI: 1.13 cm2
AV Peak grad: 9 mmHg
AV vel: 1.36
AVA: 1.36 cm2
AVAREAMEANV: 1.22 cm2
AVAREAMEANVIN: 0.83 cm2/m2
AVAREAVTIIND: 0.92 cm2/m2
AVCELMEANRAT: 0.61
AVG: 4 mmHg
AVPKVEL: 146 cm/s
Area-P 1/2: 4.68 cm2
CHL CUP AV PEAK INDEX: 0.77
CHL CUP AV VALUE AREA INDEX: 0.92
CHL CUP DOP CALC LVOT VTI: 17.6 cm
CHL CUP LVOT MV VTI INDEX: 0.71 cm2/m2
CHL CUP LVOT MV VTI: 1.04
CHL CUP TV REG PEAK VELOCITY: 282 cm/s
FS: 40 % (ref 28–44)
Height: 60 in
IVS/LV PW RATIO, ED: 0.82
LA diam end sys: 33 mm
LA vol A4C: 41.6 ml
LA vol index: 33.7 mL/m2
LADIAMINDEX: 2.24 cm/m2
LASIZE: 33 mm
LAVOL: 49.6 mL
LV PW d: 11 mm — AB (ref 0.6–1.1)
LV e' LATERAL: 8.27 cm/s
LVOT area: 2.01 cm2
LVOT diameter: 16 mm
LVOT peak grad rest: 3 mmHg
LVOTPV: 82 cm/s
LVOTSV: 35 mL
LVOTVTI: 0.68 cm
Lateral S' vel: 9.03 cm/s
MV M vel: 80.1
MVANNULUSVTI: 34 cm
Mean grad: 3 mmHg
P 1/2 time: 47 ms
RV sys press: 35 mmHg
TAPSE: 12.2 mm
TDI e' lateral: 8.27
TR max vel: 282 cm/s
Weight: 1792 oz

## 2016-12-15 LAB — LIPID PANEL
CHOLESTEROL: 187 mg/dL (ref 0–200)
HDL: 35 mg/dL — ABNORMAL LOW (ref >40)
LDL CALC: 133 mg/dL — AB (ref 0–99)
TRIGLYCERIDES: 93 mg/dL (ref <150)
Total CHOL/HDL Ratio: 5.3 RATIO
VLDL: 19 mg/dL (ref 0–40)

## 2016-12-15 LAB — CBC
HEMATOCRIT: 31.1 % — AB (ref 36.0–46.0)
HEMOGLOBIN: 10.4 g/dL — AB (ref 12.0–15.0)
MCH: 32.6 pg (ref 26.0–34.0)
MCHC: 33.4 g/dL (ref 30.0–36.0)
MCV: 97.5 fL (ref 78.0–100.0)
Platelets: 202 10*3/uL (ref 150–400)
RBC: 3.19 MIL/uL — AB (ref 3.87–5.11)
RDW: 14.4 % (ref 11.5–15.5)
WBC: 4.4 10*3/uL (ref 4.0–10.5)

## 2016-12-15 LAB — BASIC METABOLIC PANEL
ANION GAP: 9 (ref 5–15)
BUN: 11 mg/dL (ref 6–20)
BUN: 11 mg/dL (ref 4–21)
CO2: 23 mmol/L (ref 22–32)
Calcium: 8.5 mg/dL — ABNORMAL LOW (ref 8.9–10.3)
Chloride: 104 mmol/L (ref 101–111)
Creatinine, Ser: 0.83 mg/dL (ref 0.44–1.00)
Creatinine: 0.8 mg/dL (ref 0.5–1.1)
GFR calc Af Amer: >60 mL/min (ref >60)
GFR calc non Af Amer: 57 mL/min — ABNORMAL LOW (ref >60)
GLUCOSE: 85 mg/dL (ref 65–99)
Glucose: 85 mg/dL
POTASSIUM: 3.8 mmol/L (ref 3.5–5.1)
Sodium: 136 mmol/L (ref 135–145)
Sodium: 136 mmol/L — AB (ref 137–147)

## 2016-12-15 LAB — TROPONIN I: Troponin I: 0.1 ng/mL (ref <0.03)

## 2016-12-15 LAB — CBC AND DIFFERENTIAL: WBC: 4.4 10^3/mL

## 2016-12-15 MED ORDER — CARVEDILOL 6.25 MG PO TABS
6.2500 mg | ORAL_TABLET | Freq: Every day | ORAL | Status: DC
  Administered 2016-12-15 – 2016-12-17 (×3): 6.25 mg via ORAL
  Filled 2016-12-15 (×2): qty 1

## 2016-12-15 NOTE — Evaluation (Signed)
Occupational Therapy Evaluation Patient Details Name: Stephanie Cochran MRN: [id number] DOB: 07-13-1918 Today's Date: 12/15/2016    History of Present Illness Pt is a 83 y.o. female who presented to the ED with facial droop and confusion concerning for stroke.  She was found to have elevated troponins. Head CT negative for acute CVA. CTA revealed L MCA occlusion. Pt has a PMH significant for: aortic stenosis, arthritis, atrial flutter, breast cancers, CAD, diverticulosis, HLD, HTN, Mobitz II, paroxysmal artiral fibrillation, post-menopausal, peripheral vascular disease.   Clinical Impression   PTA, pt was living alone and utilizing a RW for functional mobility during ADL. Pt confused during evaluation and demonstrated difficulty providing accurate PLOF information. Per daughter, pt with recent falls and was completing ADL independently but with difficulty. Pt currently requires max assist for LB ADL and mod assist for toilet transfers. She presents with decreased safety awareness, short-term memory, disorientation to place, and decreased ability to follow commands along with generalized weakness impacting safety and independence with ADL. Pt demonstrates significant decline from PLOF and would benefit from SNF placement for continued rehabilitation post-acute D/C. OT will continue to follow acutely to improve independence and safety with ADL and functional mobility.    Follow Up Recommendations  SNF    Equipment Recommendations  Other (comment) (TBD at next venue of care)    Recommendations for Other Services       Precautions / Restrictions Precautions Precautions: Fall;ICD/Pacemaker Precaution Comments: Pacemaker Restrictions Weight Bearing Restrictions: No      Mobility Bed Mobility Overal bed mobility: Needs Assistance Bed Mobility: Supine to Sit     Supine to sit: Mod assist        Transfers Overall transfer level: Needs assistance Equipment used: Rolling walker (2  wheeled) Transfers: Sit to/from Stand Sit to Stand: Mod assist         General transfer comment: Lifting assist and VC's for use of RW.    Balance Overall balance assessment: Needs assistance Sitting-balance support: No upper extremity supported;Feet supported Sitting balance-Leahy Scale: Fair     Standing balance support: Bilateral upper extremity supported;During functional activity Standing balance-Leahy Scale: Poor                              ADL Overall ADL's : Needs assistance/impaired Eating/Feeding: Supervision/ safety;Set up;Sitting   Grooming: Supervision/safety;Set up;Sitting   Upper Body Bathing: Minimal assistance;Sitting   Lower Body Bathing: Maximal assistance;Sit to/from stand   Upper Body Dressing : Minimal assistance;Sitting   Lower Body Dressing: Maximal assistance;Sit to/from stand   Toilet Transfer: Moderate assistance;Ambulation;BSC;RW   Toileting- Clothing Manipulation and Hygiene: Moderate assistance;Sit to/from stand       Functional mobility during ADLs: Moderate assistance;Rolling walker General ADL Comments: Pt at significant risk of falling during ADL due to weakness and decreased safety awareness. Limited by cognitive status this session with little awareness of deficits.     Vision Baseline Vision/History: Wears glasses Wears Glasses: Reading only Vision Assessment?: Vision impaired- to be further tested in functional context Additional Comments: Pt able to read items on board. Difficulty assessing vision due to decreased ability to follow commands.     Perception     Praxis      Pertinent Vitals/Pain Pain Assessment: No/denies pain     Hand Dominance Right   Extremity/Trunk Assessment Upper Extremity Assessment Upper Extremity Assessment: Generalized weakness;LUE deficits/detail LUE Deficits / Details: Pain and decreased AROM of shoulder due to previous  injury from a fall years ago.   Lower Extremity  Assessment Lower Extremity Assessment: Generalized weakness       Communication Communication Communication: No difficulties   Cognition Arousal/Alertness: Awake/alert Behavior During Therapy: WFL for tasks assessed/performed Overall Cognitive Status: Impaired/Different from baseline Area of Impairment: Attention;Orientation;Memory;Following commands;Safety/judgement;Awareness;Problem solving Orientation Level: Disoriented to;Time;Situation;Place Current Attention Level: Sustained Memory: Decreased short-term memory Following Commands: Follows one step commands with increased time;Follows multi-step commands inconsistently Safety/Judgement: Decreased awareness of safety;Decreased awareness of deficits Awareness: Intellectual Problem Solving: Slow processing;Decreased initiation;Difficulty sequencing General Comments: Pt confused on evaluation. Reporting that her family assists with    General Comments       Exercises       Shoulder Instructions      Home Living Family/patient expects to be discharged to:: Private residence Living Arrangements: Alone Available Help at Discharge: Friend(s);Available PRN/intermittently Type of Home: House             Bathroom Shower/Tub: Teacher, early years/pre: Standard     Home Equipment: None          Prior Functioning/Environment Level of Independence: Independent with assistive device(s)        Comments: Pt reports independence with RW. Per daughter, pt has had a number of falls.        OT Problem List: Decreased strength;Decreased range of motion;Decreased activity tolerance;Impaired balance (sitting and/or standing);Impaired vision/perception;Decreased coordination;Decreased cognition;Decreased safety awareness;Decreased knowledge of use of DME or AE;Decreased knowledge of precautions;Impaired UE functional use;Pain      OT Treatment/Interventions: Self-care/ADL training;Therapeutic exercise;Energy  conservation;DME and/or AE instruction;Therapeutic activities;Cognitive remediation/compensation;Visual/perceptual remediation/compensation;Patient/family education;Balance training    OT Goals(Current goals can be found in the care plan section) Acute Rehab OT Goals Patient Stated Goal: to take a nap OT Goal Formulation: With patient/family Time For Goal Achievement: 12/29/16 Potential to Achieve Goals: Good ADL Goals Pt Will Perform Grooming: with supervision;standing (3 tasks) Pt Will Transfer to Toilet: with supervision;ambulating;bedside commode (BSC over toilet) Pt Will Perform Toileting - Clothing Manipulation and hygiene: with supervision;sit to/from stand Additional ADL Goal #1: Pt will follow 3/5 two-step commands during ADL task with no more than 1 verbal cue. Additional ADL Goal #2: Pt will demonstrate emergent awareness during morning ADL routine in a minimally distracting environment.  OT Frequency: Min 2X/week   Barriers to D/C:            Co-evaluation              End of Session Equipment Utilized During Treatment: Gait belt;Rolling walker Nurse Communication: Mobility status  Activity Tolerance: Patient tolerated treatment well Patient left: in bed;with call bell/phone within reach;with bed alarm set;with family/visitor present  OT Visit Diagnosis: Unsteadiness on feet (R26.81);Muscle weakness (generalized) (M62.81);Other symptoms and signs involving cognitive function                ADL either performed or assessed with clinical judgement  Time: 0816-0845 OT Time Calculation (min): 29 min Charges:  OT General Charges $OT Visit: 1 Procedure OT Evaluation $OT Eval Moderate Complexity: 1 Procedure OT Treatments $Self Care/Home Management : 8-22 mins G-Codes: OT G-codes **NOT FOR INPATIENT CLASS** Functional Assessment Tool Used: AM-PAC 6 Clicks Daily Activity Functional Limitation: Self care Self Care Current Status (G4010): At least 40 percent but less  than 60 percent impaired, limited or restricted Self Care Goal Status (U7253): At least 1 percent but less than 20 percent impaired, limited or restricted   Norman Herrlich, MS OTR/L  Pager:  [place] Kaiden [place] 12/15/2016, 10:25 AM

## 2016-12-15 NOTE — Progress Notes (Signed)
Progress Note  Patient Name: Stephanie Cochran Date of Encounter: 12/15/2016  Primary Cardiologist: Dr Meda Coffee  Subjective   Awake and alert- no chest pain. She wants to go home.  Inpatient Medications    Scheduled Meds: .  stroke: mapping our early stages of recovery book   Does not apply Once  . aspirin EC  81 mg Oral Daily  . cholecalciferol  2,000 Units Oral Daily  . clopidogrel  75 mg Oral Daily  . enoxaparin (LOVENOX) injection  30 mg Subcutaneous Q24H  . vitamin B-12  1,000 mcg Oral Daily   Continuous Infusions: . sodium chloride     PRN Meds: acetaminophen **OR** acetaminophen (TYLENOL) oral liquid 160 mg/5 mL **OR** acetaminophen, ondansetron **OR** ondansetron (ZOFRAN) IV, polyvinyl alcohol, senna-docusate   Vital Signs    Vitals:   12/15/16 0100 12/15/16 0300 12/15/16 0500 12/15/16 0643  BP: (!) 173/62 (!) 148/58 (!) 155/52 (!) 139/43  Pulse: 60 64 66 60  Resp: 18 18 18 18   Temp:    97.9 F (36.6 C)  TempSrc:    Oral  SpO2: 96% 96% 94% (!) 4%  Weight:      Height:        Intake/Output Summary (Last 24 hours) at 12/15/16 0850 Last data filed at 12/15/16 0830  Gross per 24 hour  Intake              240 ml  Output              200 ml  Net               40 ml   Filed Weights   12/14/16 0922  Weight: 112 lb (50.8 kg)    Telemetry    Paced - Personally Reviewed  ECG    Paced- Personally Reviewed  Physical Exam   GEN: No acute distress.   Neck: No JVD Cardiac: RRR, soft systolic murmur, no rubs, or gallops.  Respiratory: Clear to auscultation bilaterally. GI: Soft, nontender, non-distended  MS: No edema; No deformity. Neuro:  Nonfocal  Psych: Normal affect   Labs    Chemistry Recent Labs Lab 12/14/16 0932 12/14/16 0943 12/14/16 1554 12/15/16 0333  NA 136 137  --  136  K 4.3 4.2  --  3.8  CL 102 102  --  104  CO2 26  --   --  23  GLUCOSE 111* 107*  --  85  BUN 15 17  --  11  CREATININE 0.93 1.00 0.81 0.83  CALCIUM 8.8*  --    --  8.5*  PROT 6.2*  --   --   --   ALBUMIN 3.5  --   --   --   AST 23  --   --   --   ALT 7*  --   --   --   ALKPHOS 40  --   --   --   BILITOT 0.9  --   --   --   GFRNONAA 50*  --  59* 57*  GFRAA 58*  --  >60 >60  ANIONGAP 8  --   --  9     Hematology Recent Labs Lab 12/14/16 0932 12/14/16 0943 12/14/16 1554 12/15/16 0333  WBC 4.8  --  5.0 4.4  RBC 3.45*  --  3.33* 3.19*  HGB 11.4* 11.2* 10.8* 10.4*  HCT 33.9* 33.0* 32.2* 31.1*  MCV 98.3  --  96.7 97.5  MCH 33.0  --  32.4 32.6  MCHC 33.6  --  33.5 33.4  RDW 14.6  --  14.3 14.4  PLT 224  --  211 202    Cardiac Enzymes Recent Labs Lab 12/14/16 1554 12/14/16 1932 12/15/16 0333  TROPONINI 0.13* 0.13* 0.10*    Recent Labs Lab 12/14/16 0941  TROPIPOC 0.18*     Radiology    Ct Angio Head W/cm &/or Wo Cm  Addendum Date: 12/14/2016   ADDENDUM REPORT: 12/14/2016 16:09 ADDENDUM: Study discussed by telephone with Dr. Eulis Foster in the ED on 12/14/2016 at 1604 hours. Electronically Signed   By: Genevie Ann M.D.   On: 12/14/2016 16:09   Result Date: 12/14/2016 CLINICAL DATA:  83 year old female with right facial droop and loss of speech today. Initial encounter. EXAM: CT ANGIOGRAPHY HEAD AND NECK TECHNIQUE: Multidetector CT imaging of the head and neck was performed using the standard protocol during bolus administration of intravenous contrast. Multiplanar CT image reconstructions and MIPs were obtained to evaluate the vascular anatomy. Carotid stenosis measurements (when applicable) are obtained utilizing NASCET criteria, using the distal internal carotid diameter as the denominator. CONTRAST:  50 mL Isovue 370 COMPARISON:  Head CT without contrast 1100 hours today. FINDINGS: CTA NECK Skeleton: Previous median sternotomy. Osteopenia. Degenerative changes. Exaggerated cervical lordosis and thoracic kyphosis with partially visible mild thoracic scoliosis. Visualized paranasal sinuses and mastoids are stable and well pneumatized. Upper  chest: Partially visible left chest cardiac pacemaker or AICD device. Small layering pleural effusions. Mild pulmonary septal thickening. Mild upper lobe mosaic attenuation. No superior mediastinal lymphadenopathy. Other neck: Negative thyroid, larynx, pharynx, parapharyngeal spaces, retropharyngeal space, sublingual space, submandibular glands and parotid glands. No cervical lymphadenopathy. Aortic arch: The entire aortic arch is not included. Three vessel arch configuration suspected. Moderate soft and calcified arch atherosclerosis. Right carotid system: Visible brachiocephalic artery and right CCA origin are without stenosis. Mildly tortuous right CCA. Mild calcified plaque at the right carotid bifurcation with no proximal right ICA stenosis. Tortuous cervical right ICA with a mildly kinked appearance an mildly beaded appearance in the tortuous segment. No beading of the distal cervical right ICA. Left carotid system: Left CCA origin not included. No left CCA stenosis proximal to the bifurcation. Calcified plaque at the left carotid bifurcation extending into the left ICA origin and bulb, but less than 50 % stenosis with respect to the distal vessel results. Tortuous left ICA distal to the bulb with a kinked appearance at the level of the submandibular gland. No definite beading of the cervical left ICA. Vertebral arteries:No proximal right subclavian artery stenosis despite calcified plaque. Mild plaque at the right vertebral artery origin and V1 segment without stenosis. Mildly tortuous but otherwise negative cervical right vertebral artery. No proximal left subclavian artery stenosis despite abundant calcified plaque. Calcified plaque at the left vertebral artery origin with mild to moderate origin stenosis. Tortuous but otherwise negative cervical left vertebral artery. CTA HEAD Posterior circulation: Mildly beaded appearance of the distal left V3 and V4 segments. No distal left vertebral artery stenosis.  Patent left PICA origin. Moderate calcified atherosclerosis of the right vertebral artery V4 segment with mild to moderate stenosis. The right PICA origin remains normal. The distal right vertebral artery is patent to the vertebrobasilar junction. No basilar stenosis. AICA, SCA, and PCA origins are patent. Fetal type left PCA origin. The right posterior communicating artery is diminutive or absent. Normal bilateral PCA branches. Anterior circulation: Both ICA siphons are patent. There is moderate to severe calcified plaque affecting the siphons,  worse on the left. Associated moderate to severe supraclinoid left ICA stenosis. The left ICA terminus remains patent. More moderate appearing right supraclinoid ICA stenosis. The right ICA terminus remains patent. The ophthalmic and left posterior communicating artery origins are normal. Normal MCA and ACA origins. Diminutive or absent anterior communicating artery. Bilateral ACA branches are within normal limits. Right MCA M1 segment, right MCA bifurcation and right MCA branches are within normal limits. Left MCA M1 segment, left MCA bifurcation, and proximal left MCA M2 segments are normal. There is a posterior distal left M3 branch occlusion best demonstrated on series 506, image 32. Other left MCA branches appear within normal limits. Venous sinuses: Patent. Anatomic variants: Fetal type left PCA origin. Delayed phase: No definite acute cortically based infarct is identified. No abnormal enhancement identified. Review of the MIP images confirms the above findings IMPRESSION: 1. Negative for emergent large vessel occlusion but positive for a distal posterior Left MCA distal M3 branch occlusion. 2. Moderate to severe bilateral ICA siphon calcified plaque and siphon stenosis, maximal at the left supraclinoid segment. 3. No hemodynamically significant carotid stenosis in the neck. Possible cervical right ICA fibromuscular dysplasia (FMD). 4. Up to moderate stenosis of the  left vertebral artery origin, and the right vertebral artery V4 segment. Possible distal left vertebral artery FMD. 5. Stable non contrast CT appearance of the brain. No acute cortically based infarct identified at this time. 6. Small layering pleural effusions. Upper lobe septal thickening and mosaic attenuation may represent a combination of mild interstitial edema and gas trapping. Electronically Signed: By: Genevie Ann M.D. On: 12/14/2016 16:01   Dg Chest 2 View  Result Date: 12/14/2016 CLINICAL DATA:  Facial droop and altered mental status. Hypertension. EXAM: CHEST  2 VIEW COMPARISON:  November 30, 2016 FINDINGS: There is mild interstitial edema. No airspace consolidation. Heart is upper normal in size with mild pulmonary venous hypertension. Pacemaker leads are attached to the right atrium and right ventricle. There is calcification in the mitral annulus. Patient has had aortic valve replacement as well as coronary artery bypass grafting. No adenopathy. Bones are osteoporotic. There is degenerative change in each shoulder with chronic rotator cuff tears bilaterally. There is aortic atherosclerosis. IMPRESSION: Findings felt to represent a degree of congestive heart failure, mild by radiography. No airspace consolidation. Aortic atherosclerosis. Electronically Signed   By: Lowella Grip III M.D.   On: 12/14/2016 15:01   Ct Head Wo Contrast  Result Date: 12/14/2016 CLINICAL DATA:  Resolved stroke symptoms EXAM: CT HEAD WITHOUT CONTRAST TECHNIQUE: Contiguous axial images were obtained from the base of the skull through the vertex without intravenous contrast. COMPARISON:  None. FINDINGS: Brain: No evidence of acute infarction, hemorrhage, hydrocephalus, extra-axial collection or mass lesion/mass effect. Global cortical atrophy. Right basal ganglia lacunar infarct. Subcortical white matter and periventricular small vessel ischemic changes. Vascular: Intracranial atherosclerosis. Skull: Normal. Negative for  fracture or focal lesion. Sinuses/Orbits: The visualized paranasal sinuses are essentially clear. The mastoid air cells are unopacified. Other: None. IMPRESSION: No evidence of acute intracranial abnormality. Atrophy with small vessel ischemic changes. Old right basal ganglia lacunar infarct. Electronically Signed   By: Julian Hy M.D.   On: 12/14/2016 11:07   Ct Angio Neck W/cm &/or Wo/cm  Addendum Date: 12/14/2016   ADDENDUM REPORT: 12/14/2016 16:09 ADDENDUM: Study discussed by telephone with Dr. Eulis Foster in the ED on 12/14/2016 at 1604 hours. Electronically Signed   By: Genevie Ann M.D.   On: 12/14/2016 16:09   Result Date:  12/14/2016 CLINICAL DATA:  83 year old female with right facial droop and loss of speech today. Initial encounter. EXAM: CT ANGIOGRAPHY HEAD AND NECK TECHNIQUE: Multidetector CT imaging of the head and neck was performed using the standard protocol during bolus administration of intravenous contrast. Multiplanar CT image reconstructions and MIPs were obtained to evaluate the vascular anatomy. Carotid stenosis measurements (when applicable) are obtained utilizing NASCET criteria, using the distal internal carotid diameter as the denominator. CONTRAST:  50 mL Isovue 370 COMPARISON:  Head CT without contrast 1100 hours today. FINDINGS: CTA NECK Skeleton: Previous median sternotomy. Osteopenia. Degenerative changes. Exaggerated cervical lordosis and thoracic kyphosis with partially visible mild thoracic scoliosis. Visualized paranasal sinuses and mastoids are stable and well pneumatized. Upper chest: Partially visible left chest cardiac pacemaker or AICD device. Small layering pleural effusions. Mild pulmonary septal thickening. Mild upper lobe mosaic attenuation. No superior mediastinal lymphadenopathy. Other neck: Negative thyroid, larynx, pharynx, parapharyngeal spaces, retropharyngeal space, sublingual space, submandibular glands and parotid glands. No cervical lymphadenopathy. Aortic  arch: The entire aortic arch is not included. Three vessel arch configuration suspected. Moderate soft and calcified arch atherosclerosis. Right carotid system: Visible brachiocephalic artery and right CCA origin are without stenosis. Mildly tortuous right CCA. Mild calcified plaque at the right carotid bifurcation with no proximal right ICA stenosis. Tortuous cervical right ICA with a mildly kinked appearance an mildly beaded appearance in the tortuous segment. No beading of the distal cervical right ICA. Left carotid system: Left CCA origin not included. No left CCA stenosis proximal to the bifurcation. Calcified plaque at the left carotid bifurcation extending into the left ICA origin and bulb, but less than 50 % stenosis with respect to the distal vessel results. Tortuous left ICA distal to the bulb with a kinked appearance at the level of the submandibular gland. No definite beading of the cervical left ICA. Vertebral arteries:No proximal right subclavian artery stenosis despite calcified plaque. Mild plaque at the right vertebral artery origin and V1 segment without stenosis. Mildly tortuous but otherwise negative cervical right vertebral artery. No proximal left subclavian artery stenosis despite abundant calcified plaque. Calcified plaque at the left vertebral artery origin with mild to moderate origin stenosis. Tortuous but otherwise negative cervical left vertebral artery. CTA HEAD Posterior circulation: Mildly beaded appearance of the distal left V3 and V4 segments. No distal left vertebral artery stenosis. Patent left PICA origin. Moderate calcified atherosclerosis of the right vertebral artery V4 segment with mild to moderate stenosis. The right PICA origin remains normal. The distal right vertebral artery is patent to the vertebrobasilar junction. No basilar stenosis. AICA, SCA, and PCA origins are patent. Fetal type left PCA origin. The right posterior communicating artery is diminutive or absent.  Normal bilateral PCA branches. Anterior circulation: Both ICA siphons are patent. There is moderate to severe calcified plaque affecting the siphons, worse on the left. Associated moderate to severe supraclinoid left ICA stenosis. The left ICA terminus remains patent. More moderate appearing right supraclinoid ICA stenosis. The right ICA terminus remains patent. The ophthalmic and left posterior communicating artery origins are normal. Normal MCA and ACA origins. Diminutive or absent anterior communicating artery. Bilateral ACA branches are within normal limits. Right MCA M1 segment, right MCA bifurcation and right MCA branches are within normal limits. Left MCA M1 segment, left MCA bifurcation, and proximal left MCA M2 segments are normal. There is a posterior distal left M3 branch occlusion best demonstrated on series 506, image 32. Other left MCA branches appear within normal limits. Venous sinuses:  Patent. Anatomic variants: Fetal type left PCA origin. Delayed phase: No definite acute cortically based infarct is identified. No abnormal enhancement identified. Review of the MIP images confirms the above findings IMPRESSION: 1. Negative for emergent large vessel occlusion but positive for a distal posterior Left MCA distal M3 branch occlusion. 2. Moderate to severe bilateral ICA siphon calcified plaque and siphon stenosis, maximal at the left supraclinoid segment. 3. No hemodynamically significant carotid stenosis in the neck. Possible cervical right ICA fibromuscular dysplasia (FMD). 4. Up to moderate stenosis of the left vertebral artery origin, and the right vertebral artery V4 segment. Possible distal left vertebral artery FMD. 5. Stable non contrast CT appearance of the brain. No acute cortically based infarct identified at this time. 6. Small layering pleural effusions. Upper lobe septal thickening and mosaic attenuation may represent a combination of mild interstitial edema and gas trapping. Electronically  Signed: By: Genevie Ann M.D. On: 12/14/2016 16:01    Cardiac Studies   Echo ordered by primary service  Patient Profile     83 y.o. female with a history of CABG '07, tissue AVR '07, and PTVDP 2015, admitted with confusion and had an elevated Troponin.  Assessment & Plan    1 elevated troponin: Flat trend, doubt this is significant. No chest pain. Do not anticipate further ischemia evaluation particularly given patient's age.   2 new onset confusion: Confusion and recent decline. Pt previously living independently. This is the predominant issue.   3 H/O PAF: SSS and PAF. S/P PTVDP in 2015.  Per Dr. Francesca Oman last office note she is not a candidate for anticoagulation given her age and falls.   4 HTN: Continue Coreg. Can add amlodipine as needed.  5 H/O CABG in '07 No angina-continue aspirin and Plavix.  6 S/P prior tissue AVR in 2007: Last echo Aug 2015-  Plan: MD to see. Will f/u echo. Resume Coreg-(I ordered). Resume Norvasc if needed. No benefit from statin Rx at age 36.   Signed, Kerin Ransom, PA-C  12/15/2016, 8:50 AM    As above; pt seen and examined; no chest pain or dyspnea; confusion mildly improved; troponin minimally elevated with no clear trend; not c/w ACS; no further cardiac eval. Probable new CVA. Difficult situation; would benefit from anticoagulation given h/o PAF but h/o falls (last 3 weeks ago injuring her arm); will therefore continue off. We will sign off; please call with questions. Kirk Ruths, MD

## 2016-12-15 NOTE — Evaluation (Signed)
Physical Therapy Evaluation Patient Details Name: Stephanie Cochran MRN: [id number] DOB: 10/10/1917 Today's Date: 12/15/2016   History of Present Illness  Pt is a 83 y.o. female who presented to the ED with facial droop and confusion concerning for stroke.  She was found to have elevated troponins. Head CT negative for acute CVA. CTA revealed L MCA occlusion. Pt has a PMH significant for: aortic stenosis, arthritis, atrial flutter, breast cancers, CAD, diverticulosis, HLD, HTN, Mobitz II, paroxysmal artiral fibrillation, post-menopausal, peripheral vascular disease.  Clinical Impression  Pt seen for initial evaluation and treatment. At this time the pt was able to ambulate 40 ft with rw, min guard assist needed. Mod assist needed with sit>stand transfers as well. PTA the pt was living alone primarily. Her daughter has recently been staying with her but will be returning to her own home out of town. Based upon the patient's current mobility and decreased safety awareness demonstrated during the session, PT is recommending SNF for further rehabilitation following her acute stay.     Follow Up Recommendations SNF;Supervision/Assistance - 24 hour    Equipment Recommendations  None recommended by PT    Recommendations for Other Services       Precautions / Restrictions Precautions Precautions: Fall;ICD/Pacemaker Precaution Comments: Pacemaker Restrictions Weight Bearing Restrictions: No      Mobility  Bed Mobility Overal bed mobility: Needs Assistance Bed Mobility: Supine to Sit     Supine to sit: Mod assist     General bed mobility comments: tactile cues for sequence and physical assist with trunk to come to sitting .   Transfers Overall transfer level: Needs assistance Equipment used: Rolling walker (2 wheeled) Transfers: Sit to/from Stand Sit to Stand: Mod assist         General transfer comment: cues for hand placement  Ambulation/Gait Ambulation/Gait assistance: Min  guard Ambulation Distance (Feet): 40 Feet Assistive device: Rolling walker (2 wheeled) Gait Pattern/deviations: Step-through pattern;Decreased stride length;Narrow base of support;Trunk flexed Gait velocity: decreased   General Gait Details: pt needing encouragement for ambulation, refusing ambulation in halls. Mild instability but no gross loss of balance. Safety cues provided to avoid objects in room.   Stairs            Wheelchair Mobility    Modified Rankin (Stroke Patients Only) Modified Rankin (Stroke Patients Only) Pre-Morbid Rankin Score: Moderate disability Modified Rankin: Moderately severe disability     Balance Overall balance assessment: Needs assistance Sitting-balance support: No upper extremity supported Sitting balance-Leahy Scale: Fair     Standing balance support: Bilateral upper extremity supported Standing balance-Leahy Scale: Poor Standing balance comment: using rw and physical assist for support                             Pertinent Vitals/Pain Pain Assessment: No/denies pain    Home Living Family/patient expects to be discharged to:: Private residence Living Arrangements: Alone Available Help at Discharge: Friend(s);Available PRN/intermittently Type of Home: House Home Access: Stairs to enter   [employer] of Steps: multiple, unclear number Home Layout: One level Home Equipment: Walker - 2 wheels      Prior Function Level of Independence: Independent with assistive device(s)         Comments: using rw for ambulation     Hand Dominance   Dominant Hand: Right    Extremity/Trunk Assessment   Upper Extremity Assessment Upper Extremity Assessment: Defer to OT evaluation LUE Deficits / Details: Pain and  decreased AROM of shoulder due to previous injury from a fall years ago.    Lower Extremity Assessment Lower Extremity Assessment: Generalized weakness;RLE deficits/detail;LLE deficits/detail RLE Deficits  / Details: able to perform SLR, 4+/5 dorsiflexion RLE Sensation:  (intact to light touch) LLE Deficits / Details: able to perform SLR, 4/5 dorsiflexion LLE Sensation:  (intact to light touch)    Cervical / Trunk Assessment Cervical / Trunk Assessment: Kyphotic  Communication   Communication: No difficulties  Cognition Arousal/Alertness: Awake/alert Behavior During Therapy: WFL for tasks assessed/performed Overall Cognitive Status: Impaired/Different from baseline Area of Impairment: Attention;Safety/judgement Orientation Level: Disoriented to;Time;Situation;Place Current Attention Level: Sustained Memory: Decreased short-term memory Following Commands: Follows one step commands with increased time Safety/Judgement: Decreased awareness of safety;Decreased awareness of deficits Awareness: Intellectual Problem Solving: Slow processing;Decreased initiation;Requires tactile cues General Comments: Pt needing verbal + tactile cues for sequence of activity. Reorientation to task needed.     General Comments      Exercises     Assessment/Plan    PT Assessment Patient needs continued PT services  PT Problem List Decreased strength;Decreased range of motion;Decreased activity tolerance;Decreased balance;Decreased mobility;Decreased safety awareness       PT Treatment Interventions DME instruction;Gait training;Functional mobility training;Therapeutic activities;Therapeutic exercise;Balance training;Stair training;Neuromuscular re-education;Patient/family education    PT Goals (Current goals can be found in the Care Plan section)  Acute Rehab PT Goals Patient Stated Goal: be home PT Goal Formulation: With patient/family Time For Goal Achievement: 12/29/16 Potential to Achieve Goals: Good    Frequency Min 4X/week   Barriers to discharge        Co-evaluation               End of Session Equipment Utilized During Treatment: Gait belt Activity Tolerance: Patient tolerated  treatment well Patient left: in chair;with call bell/phone within reach;with chair alarm set;with family/visitor present Nurse Communication: Mobility status PT Visit Diagnosis: Unsteadiness on feet (R26.81);Muscle weakness (generalized) (M62.81);History of falling (Z91.81)         Time: 8016-5537 PT Time Calculation (min) (ACUTE ONLY): 28 min   Charges:   PT Evaluation $PT Eval Moderate Complexity: 1 Procedure PT Treatments $Gait Training: 8-22 mins   PT G Codes:         Cassell Clement, PT, CSCS Pager [phone] Office [phone]  12/15/2016, 12:18 PM

## 2016-12-15 NOTE — Progress Notes (Signed)
PROGRESS NOTE   CHIHIRO FREY  HCW:[id number]    DOB: [birth year]    DOA: 12/14/2016  PCP: Cari Caraway, MD   I have briefly reviewed patients previous medical records in [facility].  Brief Narrative:  83 year old female, lives alone, ambulates with the help of a cane/walker, PMH of heart block status post PPM, CAD status post CABG, RAS status post stenting, HLD, aVR, breast cancer status post mastectomy, gradual decline over the last 3-4 months, ED visit early March after a fall at home, presented to ED on 12/14/16 with new onset confusion, mumbling, facial droop and EMS noted nonverbal. By the time patient arrived in the ED, speech had normalized, facial droop had resolved but she remained confused. CT head without acute findings. CTA concerning left MCA occlusion-admitting physician discussed with neurology who did not recommend any further evaluation for that. Unable to do MRI due to pacemaker. Admitted for possible CVA.   Assessment & Plan:   Active Problems:   Coronary artery disease   Hypertension   Hyperlipidemia   Risk for falls   Near syncope   Acute encephalopathy   Elevated troponin   Anemia   Acute encephalopathy - Unclear etiology but has clinically improved and mental status probably close to baseline per family. - No significant metabolic derangements or clinical infectious etiology. No new medications per family. - TSH and B12 unremarkable. - Monitor.  TIA versus possible CVA - Based on initial history. Symptoms however resolved within 3-4 hours. - CT head without acute findings - CTA head and neck: No emergent large vessel occlusion. Positive for distal posterior left MCA distal N3 branch occlusion (admitting physician discussed with neurology on call who recommended no further intervention for this). No hemodynamically significant carotid stenosis in the neck moderate stenosis of left vertebral artery origin no acute cortically based infarct. - Unable to do  MRI due to pacemaker. - Follow 2-D echo results. A1c pending. - Continue aspirin 81 MG daily and Plavix. Would benefit from anticoagulation given history of PAF but history of falls and hence continuing off of anticoagulation. - therapies recommend SNF.  Elevated troponin  - As per cardiology not consistent with ACS and no further cardiac evaluation recommended.   PAF - Not anticoagulation candidate secondary to advanced age, frail physical status and fall risk. Continue carvedilol and aspirin.  Essential hypertension - Continue carvedilol. Controlled.  CAD status post CABG - Continue aspirin and Plavix.  Status post tissue aVR  Adult failure to thrive - Multifactorial including advanced age, some cognitive impairment at baseline and multiple comorbidities. May consider palliative care consultation as outpatient.  Left shoulder pain, status post fall - Check x-ray to rule out fractures or acute findings.  Anemia - Stable    DVT prophylaxis: Lovenox Code Status: DO NOT RESUSCITATE  Family Communication: Discussed in detail with patient's daughter at bedside.  Disposition: DC to SNF possibly 3/22.   Consultants:  Cardiology   Procedures:  Echo-results pending   Antimicrobials:  None   Subjective: Patient denies complaints. She does not know why she is in the hospital. Oriented to person, place and partly to time. As per daughter at bedside, mental status has significantly improved and almost approaching baseline.   ROS: Intermittent left shoulder pain since fall that she sustained couple of weeks ago. Having difficulty moving left shoulder.   Objective:  Vitals:   12/15/16 0500 12/15/16 0643 12/15/16 1012 12/15/16 1324  BP: (!) 155/52 (!) 139/43 (!) 123/36 (!) 98/36  Pulse:  66 60 69 70  Resp: 18 18 16 16   Temp:  97.9 F (36.6 C) 97.7 F (36.5 C) 97.9 F (36.6 C)  TempSrc:  Oral Oral Oral  SpO2: 94% (!) 4% 95% 97%  Weight:      Height:         Examination:  General exam: Pleasant elderly female lying comfortably propped up in bed. Respiratory system: Clear to auscultation. Respiratory effort normal. Cardiovascular system: S1 & S2 heard, RRR. No JVD, murmurs, rubs, gallops or clicks. No pedal edema. telemetry: AV paced rhythm.  Gastrointestinal system: Abdomen is nondistended, soft and nontender. No organomegaly or masses felt. Normal bowel sounds heard. Central nervous system: Alert and oriented to person, place and partly to time. No focal neurological deficits. Extremities: Symmetric 5 x 5 power. Left shoulder movements restricted secondary to pain but no external acute exam findings  Skin: No rashes, lesions or ulcers Psychiatry: Judgement and insight appear normal. Mood & affect appropriate.     Data Reviewed: I have personally reviewed following labs and imaging studies  CBC:  Recent Labs Lab 12/14/16 0932 12/14/16 0943 12/14/16 1554 12/15/16 0333  WBC 4.8  --  5.0 4.4  NEUTROABS 2.1  --   --   --   HGB 11.4* 11.2* 10.8* 10.4*  HCT 33.9* 33.0* 32.2* 31.1*  MCV 98.3  --  96.7 97.5  PLT 224  --  211 542   Basic Metabolic Panel:  Recent Labs Lab 12/14/16 0932 12/14/16 0943 12/14/16 1554 12/15/16 0333  NA 136 137  --  136  K 4.3 4.2  --  3.8  CL 102 102  --  104  CO2 26  --   --  23  GLUCOSE 111* 107*  --  85  BUN 15 17  --  11  CREATININE 0.93 1.00 0.81 0.83  CALCIUM 8.8*  --   --  8.5*   Liver Function Tests:  Recent Labs Lab 12/14/16 0932  AST 23  ALT 7*  ALKPHOS 40  BILITOT 0.9  PROT 6.2*  ALBUMIN 3.5   Coagulation Profile:  Recent Labs Lab 12/14/16 0932  INR 1.04   Cardiac Enzymes:  Recent Labs Lab 12/14/16 1554 12/14/16 1932 12/15/16 0333  TROPONINI 0.13* 0.13* 0.10*   HbA1C: No results for input(s): HGBA1C in the last 72 hours. CBG:  Recent Labs Lab 12/14/16 0933  GLUCAP 95    No results found for this or any previous visit (from the past 240 hour(s)).        Radiology Studies: Ct Angio Head W/cm &/or Wo Cm  Addendum Date: 12/14/2016   ADDENDUM REPORT: 12/14/2016 16:09 ADDENDUM: Study discussed by telephone with Dr. Eulis Foster in the ED on 12/14/2016 at 1604 hours. Electronically Signed   By: Genevie Ann M.D.   On: 12/14/2016 16:09   Result Date: 12/14/2016 CLINICAL DATA:  83 year old female with right facial droop and loss of speech today. Initial encounter. EXAM: CT ANGIOGRAPHY HEAD AND NECK TECHNIQUE: Multidetector CT imaging of the head and neck was performed using the standard protocol during bolus administration of intravenous contrast. Multiplanar CT image reconstructions and MIPs were obtained to evaluate the vascular anatomy. Carotid stenosis measurements (when applicable) are obtained utilizing NASCET criteria, using the distal internal carotid diameter as the denominator. CONTRAST:  50 mL Isovue 370 COMPARISON:  Head CT without contrast 1100 hours today. FINDINGS: CTA NECK Skeleton: Previous median sternotomy. Osteopenia. Degenerative changes. Exaggerated cervical lordosis and thoracic kyphosis with partially visible mild thoracic  scoliosis. Visualized paranasal sinuses and mastoids are stable and well pneumatized. Upper chest: Partially visible left chest cardiac pacemaker or AICD device. Small layering pleural effusions. Mild pulmonary septal thickening. Mild upper lobe mosaic attenuation. No superior mediastinal lymphadenopathy. Other neck: Negative thyroid, larynx, pharynx, parapharyngeal spaces, retropharyngeal space, sublingual space, submandibular glands and parotid glands. No cervical lymphadenopathy. Aortic arch: The entire aortic arch is not included. Three vessel arch configuration suspected. Moderate soft and calcified arch atherosclerosis. Right carotid system: Visible brachiocephalic artery and right CCA origin are without stenosis. Mildly tortuous right CCA. Mild calcified plaque at the right carotid bifurcation with no proximal right ICA  stenosis. Tortuous cervical right ICA with a mildly kinked appearance an mildly beaded appearance in the tortuous segment. No beading of the distal cervical right ICA. Left carotid system: Left CCA origin not included. No left CCA stenosis proximal to the bifurcation. Calcified plaque at the left carotid bifurcation extending into the left ICA origin and bulb, but less than 50 % stenosis with respect to the distal vessel results. Tortuous left ICA distal to the bulb with a kinked appearance at the level of the submandibular gland. No definite beading of the cervical left ICA. Vertebral arteries:No proximal right subclavian artery stenosis despite calcified plaque. Mild plaque at the right vertebral artery origin and V1 segment without stenosis. Mildly tortuous but otherwise negative cervical right vertebral artery. No proximal left subclavian artery stenosis despite abundant calcified plaque. Calcified plaque at the left vertebral artery origin with mild to moderate origin stenosis. Tortuous but otherwise negative cervical left vertebral artery. CTA HEAD Posterior circulation: Mildly beaded appearance of the distal left V3 and V4 segments. No distal left vertebral artery stenosis. Patent left PICA origin. Moderate calcified atherosclerosis of the right vertebral artery V4 segment with mild to moderate stenosis. The right PICA origin remains normal. The distal right vertebral artery is patent to the vertebrobasilar junction. No basilar stenosis. AICA, SCA, and PCA origins are patent. Fetal type left PCA origin. The right posterior communicating artery is diminutive or absent. Normal bilateral PCA branches. Anterior circulation: Both ICA siphons are patent. There is moderate to severe calcified plaque affecting the siphons, worse on the left. Associated moderate to severe supraclinoid left ICA stenosis. The left ICA terminus remains patent. More moderate appearing right supraclinoid ICA stenosis. The right ICA  terminus remains patent. The ophthalmic and left posterior communicating artery origins are normal. Normal MCA and ACA origins. Diminutive or absent anterior communicating artery. Bilateral ACA branches are within normal limits. Right MCA M1 segment, right MCA bifurcation and right MCA branches are within normal limits. Left MCA M1 segment, left MCA bifurcation, and proximal left MCA M2 segments are normal. There is a posterior distal left M3 branch occlusion best demonstrated on series 506, image 32. Other left MCA branches appear within normal limits. Venous sinuses: Patent. Anatomic variants: Fetal type left PCA origin. Delayed phase: No definite acute cortically based infarct is identified. No abnormal enhancement identified. Review of the MIP images confirms the above findings IMPRESSION: 1. Negative for emergent large vessel occlusion but positive for a distal posterior Left MCA distal M3 branch occlusion. 2. Moderate to severe bilateral ICA siphon calcified plaque and siphon stenosis, maximal at the left supraclinoid segment. 3. No hemodynamically significant carotid stenosis in the neck. Possible cervical right ICA fibromuscular dysplasia (FMD). 4. Up to moderate stenosis of the left vertebral artery origin, and the right vertebral artery V4 segment. Possible distal left vertebral artery FMD. 5. Stable non  contrast CT appearance of the brain. No acute cortically based infarct identified at this time. 6. Small layering pleural effusions. Upper lobe septal thickening and mosaic attenuation may represent a combination of mild interstitial edema and gas trapping. Electronically Signed: By: Genevie Ann M.D. On: 12/14/2016 16:01   Dg Chest 2 View  Result Date: 12/14/2016 CLINICAL DATA:  Facial droop and altered mental status. Hypertension. EXAM: CHEST  2 VIEW COMPARISON:  November 30, 2016 FINDINGS: There is mild interstitial edema. No airspace consolidation. Heart is upper normal in size with mild pulmonary venous  hypertension. Pacemaker leads are attached to the right atrium and right ventricle. There is calcification in the mitral annulus. Patient has had aortic valve replacement as well as coronary artery bypass grafting. No adenopathy. Bones are osteoporotic. There is degenerative change in each shoulder with chronic rotator cuff tears bilaterally. There is aortic atherosclerosis. IMPRESSION: Findings felt to represent a degree of congestive heart failure, mild by radiography. No airspace consolidation. Aortic atherosclerosis. Electronically Signed   By: Lowella Grip III M.D.   On: 12/14/2016 15:01   Ct Head Wo Contrast  Result Date: 12/14/2016 CLINICAL DATA:  Resolved stroke symptoms EXAM: CT HEAD WITHOUT CONTRAST TECHNIQUE: Contiguous axial images were obtained from the base of the skull through the vertex without intravenous contrast. COMPARISON:  None. FINDINGS: Brain: No evidence of acute infarction, hemorrhage, hydrocephalus, extra-axial collection or mass lesion/mass effect. Global cortical atrophy. Right basal ganglia lacunar infarct. Subcortical white matter and periventricular small vessel ischemic changes. Vascular: Intracranial atherosclerosis. Skull: Normal. Negative for fracture or focal lesion. Sinuses/Orbits: The visualized paranasal sinuses are essentially clear. The mastoid air cells are unopacified. Other: None. IMPRESSION: No evidence of acute intracranial abnormality. Atrophy with small vessel ischemic changes. Old right basal ganglia lacunar infarct. Electronically Signed   By: Julian Hy M.D.   On: 12/14/2016 11:07   Ct Angio Neck W/cm &/or Wo/cm  Addendum Date: 12/14/2016   ADDENDUM REPORT: 12/14/2016 16:09 ADDENDUM: Study discussed by telephone with Dr. Eulis Foster in the ED on 12/14/2016 at 1604 hours. Electronically Signed   By: Genevie Ann M.D.   On: 12/14/2016 16:09   Result Date: 12/14/2016 CLINICAL DATA:  83 year old female with right facial droop and loss of speech today. Initial  encounter. EXAM: CT ANGIOGRAPHY HEAD AND NECK TECHNIQUE: Multidetector CT imaging of the head and neck was performed using the standard protocol during bolus administration of intravenous contrast. Multiplanar CT image reconstructions and MIPs were obtained to evaluate the vascular anatomy. Carotid stenosis measurements (when applicable) are obtained utilizing NASCET criteria, using the distal internal carotid diameter as the denominator. CONTRAST:  50 mL Isovue 370 COMPARISON:  Head CT without contrast 1100 hours today. FINDINGS: CTA NECK Skeleton: Previous median sternotomy. Osteopenia. Degenerative changes. Exaggerated cervical lordosis and thoracic kyphosis with partially visible mild thoracic scoliosis. Visualized paranasal sinuses and mastoids are stable and well pneumatized. Upper chest: Partially visible left chest cardiac pacemaker or AICD device. Small layering pleural effusions. Mild pulmonary septal thickening. Mild upper lobe mosaic attenuation. No superior mediastinal lymphadenopathy. Other neck: Negative thyroid, larynx, pharynx, parapharyngeal spaces, retropharyngeal space, sublingual space, submandibular glands and parotid glands. No cervical lymphadenopathy. Aortic arch: The entire aortic arch is not included. Three vessel arch configuration suspected. Moderate soft and calcified arch atherosclerosis. Right carotid system: Visible brachiocephalic artery and right CCA origin are without stenosis. Mildly tortuous right CCA. Mild calcified plaque at the right carotid bifurcation with no proximal right ICA stenosis. Tortuous cervical right ICA with  a mildly kinked appearance an mildly beaded appearance in the tortuous segment. No beading of the distal cervical right ICA. Left carotid system: Left CCA origin not included. No left CCA stenosis proximal to the bifurcation. Calcified plaque at the left carotid bifurcation extending into the left ICA origin and bulb, but less than 50 % stenosis with respect  to the distal vessel results. Tortuous left ICA distal to the bulb with a kinked appearance at the level of the submandibular gland. No definite beading of the cervical left ICA. Vertebral arteries:No proximal right subclavian artery stenosis despite calcified plaque. Mild plaque at the right vertebral artery origin and V1 segment without stenosis. Mildly tortuous but otherwise negative cervical right vertebral artery. No proximal left subclavian artery stenosis despite abundant calcified plaque. Calcified plaque at the left vertebral artery origin with mild to moderate origin stenosis. Tortuous but otherwise negative cervical left vertebral artery. CTA HEAD Posterior circulation: Mildly beaded appearance of the distal left V3 and V4 segments. No distal left vertebral artery stenosis. Patent left PICA origin. Moderate calcified atherosclerosis of the right vertebral artery V4 segment with mild to moderate stenosis. The right PICA origin remains normal. The distal right vertebral artery is patent to the vertebrobasilar junction. No basilar stenosis. AICA, SCA, and PCA origins are patent. Fetal type left PCA origin. The right posterior communicating artery is diminutive or absent. Normal bilateral PCA branches. Anterior circulation: Both ICA siphons are patent. There is moderate to severe calcified plaque affecting the siphons, worse on the left. Associated moderate to severe supraclinoid left ICA stenosis. The left ICA terminus remains patent. More moderate appearing right supraclinoid ICA stenosis. The right ICA terminus remains patent. The ophthalmic and left posterior communicating artery origins are normal. Normal MCA and ACA origins. Diminutive or absent anterior communicating artery. Bilateral ACA branches are within normal limits. Right MCA M1 segment, right MCA bifurcation and right MCA branches are within normal limits. Left MCA M1 segment, left MCA bifurcation, and proximal left MCA M2 segments are normal.  There is a posterior distal left M3 branch occlusion best demonstrated on series 506, image 32. Other left MCA branches appear within normal limits. Venous sinuses: Patent. Anatomic variants: Fetal type left PCA origin. Delayed phase: No definite acute cortically based infarct is identified. No abnormal enhancement identified. Review of the MIP images confirms the above findings IMPRESSION: 1. Negative for emergent large vessel occlusion but positive for a distal posterior Left MCA distal M3 branch occlusion. 2. Moderate to severe bilateral ICA siphon calcified plaque and siphon stenosis, maximal at the left supraclinoid segment. 3. No hemodynamically significant carotid stenosis in the neck. Possible cervical right ICA fibromuscular dysplasia (FMD). 4. Up to moderate stenosis of the left vertebral artery origin, and the right vertebral artery V4 segment. Possible distal left vertebral artery FMD. 5. Stable non contrast CT appearance of the brain. No acute cortically based infarct identified at this time. 6. Small layering pleural effusions. Upper lobe septal thickening and mosaic attenuation may represent a combination of mild interstitial edema and gas trapping. Electronically Signed: By: Genevie Ann M.D. On: 12/14/2016 16:01        Scheduled Meds: .  stroke: mapping our early stages of recovery book   Does not apply Once  . aspirin EC  81 mg Oral Daily  . carvedilol  6.25 mg Oral Daily  . cholecalciferol  2,000 Units Oral Daily  . clopidogrel  75 mg Oral Daily  . enoxaparin (LOVENOX) injection  30 mg Subcutaneous  Q24H  . vitamin B-12  1,000 mcg Oral Daily   Continuous Infusions: . sodium chloride       LOS: 0 days     Sherral Dirocco, MD, FACP, FHM. [facility] Pager [phone]  If 7PM-7AM, please contact night-coverage [email or web address] Password [facility] 12/15/2016, 4:55 PM

## 2016-12-15 NOTE — Progress Notes (Signed)
  Echocardiogram 2D Echocardiogram has been performed.  Stephanie Cochran 12/15/2016, 5:19 PM

## 2016-12-15 NOTE — Evaluation (Signed)
Speech Language Pathology Evaluation Patient Details Name: Stephanie Cochran MRN: [id number] DOB: 10/28/1917 Today's Date: 12/15/2016 Time: 5701-7793 SLP Time Calculation (min) (ACUTE ONLY): 15 min  Problem List:  Patient Active Problem List   Diagnosis Date Noted  . Near syncope 12/14/2016  . Acute encephalopathy 12/14/2016  . Elevated troponin 12/14/2016  . Anemia 12/14/2016  . Acute ischemic left MCA stroke ([place])   . Encephalopathy   . CHF (congestive heart failure) ([place]) 12/09/2015  . Risk for falls 12/09/2015  . History of recent fall 12/09/2015  . Complete heart block ([place]) 05/13/2014  . AV block, 2nd degree 05/13/2014  . Atherosclerosis of renal artery ([place]) 01/04/2012  . Lower extremity edema 11/11/2011  . Coronary artery disease 11/11/2011  . History of aortic valve replacement with bioprosthetic valve 11/11/2011  . History of renal artery stenosis 11/11/2011  . Hypertension 11/11/2011  . Hyperlipidemia 11/11/2011   Past Medical History:  Past Medical History:  Diagnosis Date  . Aortic stenosis    a. s/p bioprosthetic AVR 2007 [facility]);  b. Echo (04/2009 - [facility] in [place], [place]):  Mild LVH, normal LVF, anterior WMA, AVR ok (mean 4 mmHg), MAC, mild MR, mild TV stenosis, RVSP 44 mmHg  . Arthritis   . Atrial flutter ([place])    post operative  . Breast cancer ([place])    left mastectomy  . CAD (coronary artery disease)    s/p CABG 2007 at [facility]  . Diverticulosis    history  . HLD (hyperlipidemia)   . HTN (hypertension)   . Hx of echocardiogram    Echo (8/15):  Mild focal basal septal hypertrophy, EF 60-65%, no RWMA, AVR ok (mean 8 mmHg), MAC, mild MS (mean 4 mmHg), mod MR, mild LAE, PASP 54 mmHg  . Mobitz II    a. s/p STJ dual chamber PPM 2015  . Paroxysmal atrial fibrillation ([facility])   . Post-menopausal   . PVD (peripheral vascular disease) ([place])    Past Surgical History:  Past Surgical History:  Procedure Laterality Date  . AORTIC VALVE [facility]ACEMENT  2007   [facility]   . ARTERIOVENOUS GRAFT PLACEMENT W/ ENDOSCOPIC VEIN HARVEST Left    left leg  . BREAST SURGERY    . CORONARY ARTERY BYPASS GRAFT Left 2007   x3, left internal mammary artery to the left anterior descending, saphenous vein graft tot he circumflex coronary  . EYE SURGERY    . HEMORROIDECTOMY    . MASTECTOMY Left 2008   left, breast cancer, followed by a hematology oncologist  . [place] 05/14/2014   SJM Assurity DR implanted by Dr ARayann Hemanfor complete heart block  . RENAL ARTERY STENT  2006, 2010  . TONSILLECTOMY  1943   HPI:  Pt is a 83y.o. female who presented to the ED with facial asymmetry and confusion concerning for stroke. She was found to have elevated troponins. Head CT negative for acute CVA, old right basal ganglia lacunar infarct. Pt has a PMH significant for: aortic stenosis, arthritis, atrial flutter, breast cancers, CAD, diverticulosis, HLD, HTN, Mobitz II, paroxysmal atrial fibrillation, peripheral vascular disease.   Assessment / Plan / Recommendation Clinical Impression  Pt presents with significant decline in cognition - disoriented to time/place/situation, with no carryover of information after repetition.  Poor attention for storage of new information; mod impairments working memory and divergent thinking.  Poor recall of task instructions and sustained attention.  Current cognitive function is significant departure from  baseline level of function per pt and daughter.  Pt does show recognition of deficits and this frustrates her.  Recommend SLP f/u for cognition in the next venue of care.  Acute care SLP will sign off.     SLP Assessment  SLP Recommendation/Assessment: All further Speech Lanaguage Pathology  needs can be addressed in the next venue of care SLP Visit Diagnosis: Attention and concentration deficit Attention and concentration deficit following: Cerebral infarction    Follow Up  Recommendations  Skilled Nursing facility    Frequency and Duration           SLP Evaluation Cognition  Overall Cognitive Status: Impaired/Different from baseline Arousal/Alertness: Awake/alert Orientation Level: Oriented to person;Disoriented to place;Disoriented to time;Disoriented to situation Attention: Focused Focused Attention: Impaired Memory: Impaired Memory Impairment: Storage deficit Awareness: Impaired Awareness Impairment: Emergent impairment Problem Solving: Impaired Safety/Judgment: Impaired       Comprehension  Auditory Comprehension Overall Auditory Comprehension: Appears within functional limits for tasks assessed Visual Recognition/Discrimination Discrimination: Not tested    Expression Expression Primary Mode of Expression: Verbal Verbal Expression Overall Verbal Expression: Appears within functional limits for tasks assessed Written Expression Dominant Hand: Right   Oral / Motor  Oral Motor/Sensory Function Overall Oral Motor/Sensory Function: Within functional limits Motor Speech Overall Motor Speech: Appears within functional limits for tasks assessed   GO          Functional Assessment Tool Used: clinical judgment Functional Limitations: Memory Memory Current Status (K1594): At least 40 percent but less than 60 percent impaired, limited or restricted Memory Goal Status (L0761): At least 40 percent but less than 60 percent impaired, limited or restricted Memory Discharge Status [phone]): At least 40 percent but less than 60 percent impaired, limited or restricted         Juan Quam Laurice 12/15/2016, 12:20 PM

## 2016-12-16 ENCOUNTER — Other Ambulatory Visit: Payer: Self-pay | Admitting: *Deleted

## 2016-12-16 LAB — HEMOGLOBIN A1C
HEMOGLOBIN A1C: 5.2 % (ref 4.8–5.6)
Mean Plasma Glucose: 103 mg/dL

## 2016-12-16 NOTE — Progress Notes (Signed)
PROGRESS NOTE   Stephanie Cochran  HYI:[id number]    DOB: 12/09/1917    DOA: 12/14/2016  PCP: Cari Caraway, MD   I have briefly reviewed patients previous medical records in [facility].  Brief Narrative:  83 year old female, lives alone, ambulates with the help of a cane/walker, PMH of heart block status post PPM, CAD status post CABG, RAS status post stenting, HLD, aVR, breast cancer status post mastectomy, gradual decline over the last 3-4 months, ED visit early March after a fall at home, presented to ED on 12/14/16 with new onset confusion, mumbling, facial droop and EMS noted nonverbal. By the time patient arrived in the ED, speech had normalized, facial droop had resolved but she remained confused. CT head without acute findings. CTA concerning left MCA occlusion-admitting physician discussed with neurology who did not recommend any further evaluation for that. Unable to do MRI due to pacemaker. Admitted for possible CVA.   Assessment & Plan:   Active Problems:   Coronary artery disease   Hypertension   Hyperlipidemia   Risk for falls   Near syncope   Acute encephalopathy   Elevated troponin   Anemia   Acute encephalopathy - Unclear etiology - No significant metabolic derangements or clinical infectious etiology. No new medications per family. - TSH and B12 unremarkable. - Mental status had improved but again worsened overnight. Based on history, likely has dementia and sundowning and hospitalization likely contributing. - Urine microscopy: Negative for UTI features.  Possible CVA - Based on initial history. Symptoms however resolved within 3-4 hours. Recurrent confusion. - CT head without acute findings - CTA head and neck: No emergent large vessel occlusion. 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 No hemodynamically significant carotid  stenosis in the neck moderate stenosis of left vertebral artery origin no acute cortically based infarct. - Unable to do MRI due to pacemaker. - Follow 2-D echo: LVEF 60-65 percent. - Hemoglobin A1c: 5.2 - LDL 133 - Continue aspirin 81 MG daily and Plavix. Would benefit from anticoagulation given history of PAF but history of falls and hence continuing off of anticoagulation. - therapies recommend SNF. - Due to worsened/recurrent confusion, would like to monitor overnight to ensure that she doesn't get worse. Also patient not safe to return home.  Elevated troponin  - As per cardiology not consistent with ACS and no further cardiac evaluation recommended.   PAF - Not anticoagulation candidate secondary to advanced age, frail physical status and fall risk. Continue carvedilol and aspirin.  Essential hypertension - Continue carvedilol. Controlled.  CAD status post CABG - Continue aspirin and Plavix.  Status post tissue aVR  Adult failure to thrive - Multifactorial including advanced age, some cognitive impairment at baseline and multiple comorbidities. May consider palliative care consultation as outpatient.  Left shoulder pain, status post fall - Left shoulder x-ray without acute findings.  Anemia - Stable    DVT prophylaxis: Lovenox Code Status: DO NOT RESUSCITATE  Family Communication: Discussed in detail with patient's daughter at bedside.  Disposition: DC to SNF possibly 3/23.   Consultants:  Cardiology   Procedures:  2-D echo 12/15/16: Study Conclusions  - Left ventricle: The cavity size was normal. Wall thickness was   normal. Systolic function was normal. The estimated ejection   fraction was in the range of 60% to 65%. Wall motion was normal;   there were no regional wall motion abnormalities. Features  are   consistent with a pseudonormal left ventricular filling pattern,   with concomitant abnormal relaxation and increased filling   pressure (grade 2  diastolic dysfunction). - Aortic valve: There was a bioprosthetic aortic valve. There was   no stenosis. There was no regurgitation. Mean gradient (S): 4 mm   Hg. - Mitral valve: Moderately to severely calcified annulus. There was   mild regurgitation. - Left atrium: The atrium was mildly dilated. - Right ventricle: The cavity size was normal. Pacer wire or   catheter noted in right ventricle. Systolic function was mildly   reduced. - Tricuspid valve: Peak RV-RA gradient (S): 32 mm Hg. - Pulmonary arteries: PA peak pressure: 35 mm Hg (S). - Inferior vena cava: The vessel was normal in size. The   respirophasic diameter changes were in the normal range (= 50%),   consistent with normal central venous pressure.  Impressions:  - Normal LV size with mild LV hypertrophy. EF 60-65%. Moderate   diastolic dysfunction. Normal RV size with mildly decreased   systolic function. Bioprosthetic aortic valve appeared to   function normally. Mild mitral regurgitation.  Antimicrobials:  None   Subjective: Pleasantly confused. As per daughter at bedside, mental status had improved until last night when again worsened area did  ROS: No shoulder pain reported today.  Objective:  Vitals:   12/15/16 2102 12/16/16 0056 12/16/16 0510 12/16/16 1355  BP: (!) 125/59 (!) 134/57 128/69 (!) 104/46  Pulse: 70 70 70 70  Resp: 16 18 20 20   Temp: 98.4 F (36.9 C) 98 F (36.7 C) 97.5 F (36.4 C) 97.9 F (36.6 C)  TempSrc: Oral Oral  Oral  SpO2: 98% 97% 98% 98%  Weight:      Height:        Examination:  General exam: Pleasant elderly female lying comfortably propped up in bed. Respiratory system: Clear to auscultation. Respiratory effort normal. Cardiovascular system: S1 & S2 heard, RRR. No JVD, murmurs, rubs, gallops or clicks. No pedal edema. telemetry: AV paced rhythm.  Gastrointestinal system: Abdomen is nondistended, soft and nontender. No organomegaly or masses felt. Normal bowel sounds  heard. Central nervous system: Alert and oriented to person, partly to place but not to time. No focal neurological deficits. Extremities: Symmetric 5 x 5 power. Left shoulder movements restricted secondary to pain but no external acute exam findings  Skin: No rashes, lesions or ulcers Psychiatry: Judgement and insight impaired. Mood & affect appropriate.     Data Reviewed: I have personally reviewed following labs and imaging studies  CBC:  Recent Labs Lab 12/14/16 0932 12/14/16 0943 12/14/16 1554 12/15/16 0333  WBC 4.8  --  5.0 4.4  NEUTROABS 2.1  --   --   --   HGB 11.4* 11.2* 10.8* 10.4*  HCT 33.9* 33.0* 32.2* 31.1*  MCV 98.3  --  96.7 97.5  PLT 224  --  211 101   Basic Metabolic Panel:  Recent Labs Lab 12/14/16 0932 12/14/16 0943 12/14/16 1554 12/15/16 0333  NA 136 137  --  136  K 4.3 4.2  --  3.8  CL 102 102  --  104  CO2 26  --   --  23  GLUCOSE 111* 107*  --  85  BUN 15 17  --  11  CREATININE 0.93 1.00 0.81 0.83  CALCIUM 8.8*  --   --  8.5*   Liver Function Tests:  Recent Labs Lab 12/14/16 0932  AST 23  ALT 7*  ALKPHOS  40  BILITOT 0.9  PROT 6.2*  ALBUMIN 3.5   Coagulation Profile:  Recent Labs Lab 12/14/16 0932  INR 1.04   Cardiac Enzymes:  Recent Labs Lab 12/14/16 1554 12/14/16 1932 12/15/16 0333  TROPONINI 0.13* 0.13* 0.10*   HbA1C:  Recent Labs  12/15/16 0333  HGBA1C 5.2   CBG:  Recent Labs Lab 12/14/16 0933  GLUCAP 95    No results found for this or any previous visit (from the past 240 hour(s)).       Radiology Studies: Ct Angio Head W/cm &/or Wo Cm  Addendum Date: 12/14/2016   ADDENDUM REPORT: 12/14/2016 16:09 ADDENDUM: Study discussed by telephone with Dr. Eulis Foster in the ED on 12/14/2016 at 1604 hours. Electronically Signed   By: Genevie Ann M.D.   On: 12/14/2016 16:09   Result Date: 12/14/2016 CLINICAL DATA:  83 year old female with right facial droop and loss of speech today. Initial encounter. EXAM: CT  ANGIOGRAPHY HEAD AND NECK TECHNIQUE: Multidetector CT imaging of the head and neck was performed using the standard protocol during bolus administration of intravenous contrast. Multiplanar CT image reconstructions and MIPs were obtained to evaluate the vascular anatomy. Carotid stenosis measurements (when applicable) are obtained utilizing NASCET criteria, using the distal internal carotid diameter as the denominator. CONTRAST:  50 mL Isovue 370 COMPARISON:  Head CT without contrast 1100 hours today. FINDINGS: CTA NECK Skeleton: Previous median sternotomy. Osteopenia. Degenerative changes. Exaggerated cervical lordosis and thoracic kyphosis with partially visible mild thoracic scoliosis. Visualized paranasal sinuses and mastoids are stable and well pneumatized. Upper chest: Partially visible left chest cardiac pacemaker or AICD device. Small layering pleural effusions. Mild pulmonary septal thickening. Mild upper lobe mosaic attenuation. No superior mediastinal lymphadenopathy. Other neck: Negative thyroid, larynx, pharynx, parapharyngeal spaces, retropharyngeal space, sublingual space, submandibular glands and parotid glands. No cervical lymphadenopathy. Aortic arch: The entire aortic arch is not included. Three vessel arch configuration suspected. Moderate soft and calcified arch atherosclerosis. Right carotid system: Visible brachiocephalic artery and right CCA origin are without stenosis. Mildly tortuous right CCA. Mild calcified plaque at the right carotid bifurcation with no proximal right ICA stenosis. Tortuous cervical right ICA with a mildly kinked appearance an mildly beaded appearance in the tortuous segment. No beading of the distal cervical right ICA. Left carotid system: Left CCA origin not included. No left CCA stenosis proximal to the bifurcation. Calcified plaque at the left carotid bifurcation extending into the left ICA origin and bulb, but less than 50 % stenosis with respect to the distal  vessel results. Tortuous left ICA distal to the bulb with a kinked appearance at the level of the submandibular gland. No definite beading of the cervical left ICA. Vertebral arteries:No proximal right subclavian artery stenosis despite calcified plaque. Mild plaque at the right vertebral artery origin and V1 segment without stenosis. Mildly tortuous but otherwise negative cervical right vertebral artery. No proximal left subclavian artery stenosis despite abundant calcified plaque. Calcified plaque at the left vertebral artery origin with mild to moderate origin stenosis. Tortuous but otherwise negative cervical left vertebral artery. CTA HEAD Posterior circulation: Mildly beaded appearance of the distal left V3 and V4 segments. No distal left vertebral artery stenosis. Patent left PICA origin. Moderate calcified atherosclerosis of the right vertebral artery V4 segment with mild to moderate stenosis. The right PICA origin remains normal. The distal right vertebral artery is patent to the vertebrobasilar junction. No basilar stenosis. AICA, SCA, and PCA origins are patent. Fetal type left PCA origin. The right posterior  communicating artery is diminutive or absent. Normal bilateral PCA branches. Anterior circulation: Both ICA siphons are patent. There is moderate to severe calcified plaque affecting the siphons, worse on the left. Associated moderate to severe supraclinoid left ICA stenosis. The left ICA terminus remains patent. More moderate appearing right supraclinoid ICA stenosis. The right ICA terminus remains patent. The ophthalmic and left posterior communicating artery origins are normal. Normal MCA and ACA origins. Diminutive or absent anterior communicating artery. Bilateral ACA branches are within normal limits. Right MCA M1 segment, right MCA bifurcation and right MCA branches are within normal limits. Left MCA M1 segment, left MCA bifurcation, and proximal left MCA M2 segments are normal. There is a  posterior distal left M3 branch occlusion best demonstrated on series 506, image 32. Other left MCA branches appear within normal limits. Venous sinuses: Patent. Anatomic variants: Fetal type left PCA origin. Delayed phase: No definite acute cortically based infarct is identified. No abnormal enhancement identified. Review of the MIP images confirms the above findings IMPRESSION: 1. Negative for emergent large vessel occlusion but positive for a distal posterior Left MCA distal M3 branch occlusion. 2. Moderate to severe bilateral ICA siphon calcified plaque and siphon stenosis, maximal at the left supraclinoid segment. 3. No hemodynamically significant carotid stenosis in the neck. Possible cervical right ICA fibromuscular dysplasia (FMD). 4. Up to moderate stenosis of the left vertebral artery origin, and the right vertebral artery V4 segment. Possible distal left vertebral artery FMD. 5. Stable non contrast CT appearance of the brain. No acute cortically based infarct identified at this time. 6. Small layering pleural effusions. Upper lobe septal thickening and mosaic attenuation may represent a combination of mild interstitial edema and gas trapping. Electronically Signed: By: Genevie Ann M.D. On: 12/14/2016 16:01   Ct Angio Neck W/cm &/or Wo/cm  Addendum Date: 12/14/2016   ADDENDUM REPORT: 12/14/2016 16:09 ADDENDUM: Study discussed by telephone with Dr. Eulis Foster in the ED on 12/14/2016 at 1604 hours. Electronically Signed   By: Genevie Ann M.D.   On: 12/14/2016 16:09   Result Date: 12/14/2016 CLINICAL DATA:  83 year old female with right facial droop and loss of speech today. Initial encounter. EXAM: CT ANGIOGRAPHY HEAD AND NECK TECHNIQUE: Multidetector CT imaging of the head and neck was performed using the standard protocol during bolus administration of intravenous contrast. Multiplanar CT image reconstructions and MIPs were obtained to evaluate the vascular anatomy. Carotid stenosis measurements (when applicable)  are obtained utilizing NASCET criteria, using the distal internal carotid diameter as the denominator. CONTRAST:  50 mL Isovue 370 COMPARISON:  Head CT without contrast 1100 hours today. FINDINGS: CTA NECK Skeleton: Previous median sternotomy. Osteopenia. Degenerative changes. Exaggerated cervical lordosis and thoracic kyphosis with partially visible mild thoracic scoliosis. Visualized paranasal sinuses and mastoids are stable and well pneumatized. Upper chest: Partially visible left chest cardiac pacemaker or AICD device. Small layering pleural effusions. Mild pulmonary septal thickening. Mild upper lobe mosaic attenuation. No superior mediastinal lymphadenopathy. Other neck: Negative thyroid, larynx, pharynx, parapharyngeal spaces, retropharyngeal space, sublingual space, submandibular glands and parotid glands. No cervical lymphadenopathy. Aortic arch: The entire aortic arch is not included. Three vessel arch configuration suspected. Moderate soft and calcified arch atherosclerosis. Right carotid system: Visible brachiocephalic artery and right CCA origin are without stenosis. Mildly tortuous right CCA. Mild calcified plaque at the right carotid bifurcation with no proximal right ICA stenosis. Tortuous cervical right ICA with a mildly kinked appearance an mildly beaded appearance in the tortuous segment. No beading of the distal  cervical right ICA. Left carotid system: Left CCA origin not included. No left CCA stenosis proximal to the bifurcation. Calcified plaque at the left carotid bifurcation extending into the left ICA origin and bulb, but less than 50 % stenosis with respect to the distal vessel results. Tortuous left ICA distal to the bulb with a kinked appearance at the level of the submandibular gland. No definite beading of the cervical left ICA. Vertebral arteries:No proximal right subclavian artery stenosis despite calcified plaque. Mild plaque at the right vertebral artery origin and V1 segment  without stenosis. Mildly tortuous but otherwise negative cervical right vertebral artery. No proximal left subclavian artery stenosis despite abundant calcified plaque. Calcified plaque at the left vertebral artery origin with mild to moderate origin stenosis. Tortuous but otherwise negative cervical left vertebral artery. CTA HEAD Posterior circulation: Mildly beaded appearance of the distal left V3 and V4 segments. No distal left vertebral artery stenosis. Patent left PICA origin. Moderate calcified atherosclerosis of the right vertebral artery V4 segment with mild to moderate stenosis. The right PICA origin remains normal. The distal right vertebral artery is patent to the vertebrobasilar junction. No basilar stenosis. AICA, SCA, and PCA origins are patent. Fetal type left PCA origin. The right posterior communicating artery is diminutive or absent. Normal bilateral PCA branches. Anterior circulation: Both ICA siphons are patent. There is moderate to severe calcified plaque affecting the siphons, worse on the left. Associated moderate to severe supraclinoid left ICA stenosis. The left ICA terminus remains patent. More moderate appearing right supraclinoid ICA stenosis. The right ICA terminus remains patent. The ophthalmic and left posterior communicating artery origins are normal. Normal MCA and ACA origins. Diminutive or absent anterior communicating artery. Bilateral ACA branches are within normal limits. Right MCA M1 segment, right MCA bifurcation and right MCA branches are within normal limits. Left MCA M1 segment, left MCA bifurcation, and proximal left MCA M2 segments are normal. There is a posterior distal left M3 branch occlusion best demonstrated on series 506, image 32. Other left MCA branches appear within normal limits. Venous sinuses: Patent. Anatomic variants: Fetal type left PCA origin. Delayed phase: No definite acute cortically based infarct is identified. No abnormal enhancement identified.  Review of the MIP images confirms the above findings IMPRESSION: 1. Negative for emergent large vessel occlusion but positive for a distal posterior Left MCA distal M3 branch occlusion. 2. Moderate to severe bilateral ICA siphon calcified plaque and siphon stenosis, maximal at the left supraclinoid segment. 3. No hemodynamically significant carotid stenosis in the neck. Possible cervical right ICA fibromuscular dysplasia (FMD). 4. Up to moderate stenosis of the left vertebral artery origin, and the right vertebral artery V4 segment. Possible distal left vertebral artery FMD. 5. Stable non contrast CT appearance of the brain. No acute cortically based infarct identified at this time. 6. Small layering pleural effusions. Upper lobe septal thickening and mosaic attenuation may represent a combination of mild interstitial edema and gas trapping. Electronically Signed: By: Genevie Ann M.D. On: 12/14/2016 16:01   Dg Shoulder Left  Result Date: 12/15/2016 CLINICAL DATA:  Left shoulder pain. EXAM: LEFT SHOULDER - 2+ VIEW COMPARISON:  None. FINDINGS: The bones are diffusely osteopenic. There is a high-riding left humeral head with remodeling of the undersurface of the left acromion and sub chondral cystic change in the superior aspect of the left humeral head. A amorphous soft tissue calcification between the left acromion and left humeral head is again noted. Mild degenerative changes of the glenohumeral joint. No  acute fracture or subluxation. No dislocation. IMPRESSION: 1. 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 Electronically Signed   By: Kerby Moors M.D.   On: 12/15/2016 18:33        Scheduled Meds: .  stroke: mapping our early stages of recovery book   Does not apply Once  . aspirin EC  81 mg Oral Daily  . carvedilol  6.25 mg Oral Daily  . cholecalciferol  2,000 Units Oral Daily  . clopidogrel  75 mg Oral Daily  .  enoxaparin (LOVENOX) injection  30 mg Subcutaneous Q24H  . vitamin B-12  1,000 mcg Oral Daily   Continuous Infusions: . sodium chloride       LOS: 1 day     Taraann Olthoff, MD, FACP, FHM. [facility] Pager [phone]  If 7PM-7AM, please contact night-coverage [email or web address] Password [facility] 12/16/2016, 3:28 PM

## 2016-12-16 NOTE — [place] FL2 (Signed)
[place] MEDICAID FL2 LEVEL OF CARE SCREENING TOOL     IDENTIFICATION  Patient Name: Stephanie Cochran Birthdate: Dec 02, 1917 Sex: female Admission Date (Current Location): 12/14/2016  [facility] and [place] Number:  Herbalist and Address:  [facility] [place], Stephanie Cochran [address] [place], Stephanie Cochran [address]      Provider Number: [id number]  Attending Physician Name and Address:  Modena Jansky, MD  Relative Name and Phone Number:    Stephanie Cochran (daughter) [phone]    Current Level of Care: Hospital Recommended Level of Care: [place] Prior Approval Number:    Date Approved/Denied:   PASRR Number: [id number]  Discharge Plan: SNF    Current Diagnoses: Patient Active Problem List   Diagnosis Date Noted  . Near syncope 12/14/2016  . Acute encephalopathy 12/14/2016  . Elevated troponin 12/14/2016  . Anemia 12/14/2016  . Acute ischemic left MCA stroke ([place])   . Encephalopathy   . CHF (congestive heart failure) ([place]) 12/09/2015  . Risk for falls 12/09/2015  . History of recent fall 12/09/2015  . Complete heart block ([place]) 05/13/2014  . AV block, 2nd degree 05/13/2014  . Atherosclerosis of renal artery ([place]) 01/04/2012  . Lower extremity edema 11/11/2011  . Coronary artery disease 11/11/2011  . History of aortic valve replacement with bioprosthetic valve 11/11/2011  . History of renal artery stenosis 11/11/2011  . Hypertension 11/11/2011  . Hyperlipidemia 11/11/2011    Orientation RESPIRATION BLADDER Height & Weight     Self, Time, Situation, Place  Normal Continent Weight: 50.8 kg (112 lb) Height:  5' (152.4 cm)  BEHAVIORAL SYMPTOMS/MOOD NEUROLOGICAL BOWEL NUTRITION STATUS      Continent Diet (Soft)  AMBULATORY STATUS COMMUNICATION OF NEEDS Skin   Limited Assist Verbally Other (Comment) (Ecchymosis: Arm, head - anterior left and right)                       Personal Care Assistance Level of Assistance   Bathing, Feeding, Dressing Bathing Assistance:  (Upper Body:  Minimal Assist; Lower Body:  Maximum Assist) Feeding assistance: Independent (Needs setup) Dressing Assistance:  (Upper Body:  Minimal Assist; Lower Body:  Maximum Assist)     Functional Limitations Info  Sight, Hearing, Speech Sight Info: Adequate Hearing Info: Adequate Speech Info: Impaired (Slurred Speech; Dysparthria)    SPECIAL CARE FACTORS FREQUENCY  PT (By licensed PT), OT (By licensed OT)     PT Frequency: PT Eval completed on 12/15/2016 with recommendaiton of 4x/week minimum. OT Frequency: OT Eval completed on 12/15/2016 with recommendAtion of 2x/week minimum.     Speech Therapy Frequency: SLP Eval completed on 12/15/2016 with no followup recommedations.        Contractures Contractures Info: Not present    Additional Factors Info  Allergies, Code Status Code Status Info: DNR Allergies Info: Codeine, Penicillins, Vicodin Hydrocodone-acetaminophen           Current Medications (12/16/2016):  This is the current hospital active medication list Current Facility-Administered Medications  Medication Dose Route Frequency Provider Last Rate Last Dose  .  stroke: mapping our early stages of recovery book   Does not apply Once Dionne Milo, NP      . 0.9 %  sodium chloride infusion   Intravenous Continuous Dionne Milo, NP      . acetaminophen (TYLENOL) tablet 650 mg  650 mg Oral Q4H PRN Dionne Milo, NP       Or  . acetaminophen (  TYLENOL) solution 650 mg  650 mg Per Tube Q4H PRN Dionne Milo, NP       Or  . acetaminophen (TYLENOL) suppository 650 mg  650 mg Rectal Q4H PRN Dionne Milo, NP      . aspirin EC tablet 81 mg  81 mg Oral Daily Dionne Milo, NP   81 mg at 12/16/16 0912  . carvedilol (COREG) tablet 6.25 mg  6.25 mg Oral Daily Erlene Quan, PA-C   6.25 mg at 12/15/16 1718  . cholecalciferol (VITAMIN D) tablet 2,000 Units  2,000 Units Oral Daily Dionne Milo, NP    2,000 Units at 12/16/16 0912  . clopidogrel (PLAVIX) tablet 75 mg  75 mg Oral Daily Dionne Milo, NP   75 mg at 12/16/16 0912  . enoxaparin (LOVENOX) injection 30 mg  30 mg Subcutaneous Q24H Dionne Milo, NP   30 mg at 12/15/16 1724  . ondansetron (ZOFRAN) tablet 4 mg  4 mg Oral Q6H PRN Dionne Milo, NP       Or  . ondansetron [facility]) injection 4 mg  4 mg Intravenous Q6H PRN Dionne Milo, NP      . polyvinyl alcohol (LIQUIFILM TEARS) 1.4 % ophthalmic solution 1 drop  1 drop Both Eyes PRN Daleen Bo, MD      . senna-docusate (Senokot-S) tablet 1 tablet  1 tablet Oral QHS PRN Dionne Milo, NP      . vitamin B-12 (CYANOCOBALAMIN) tablet 1,000 mcg  1,000 mcg Oral Daily Dionne Milo, NP   1,000 mcg at 12/16/16 4503     Discharge Medications: Please see discharge summary for a list of discharge medications.  Relevant Imaging Results:  Relevant Lab Results:   Additional Information SSN:  [id number]  Lajoyce Lauber Work [phone]

## 2016-12-16 NOTE — Clinical Social Work Note (Signed)
Clinical Social Work Assessment  Patient Details  Name: Stephanie Cochran MRN: [id number] Date of Birth: 10-22-1917  Date of referral:  12/16/16               Reason for consult:  Discharge Planning                Permission sought to share information with:  Family Supports Permission granted to share information::  Yes, Verbal Permission Granted  Name::     Donnella Sham  Agency::     Relationship::  Daughter  Contact Information:  [phone]  Housing/Transportation Living arrangements for the past 2 months:  [place] of Information:  Patient, Adult Children Patient Interpreter Needed:  None Criminal Activity/Legal Involvement Pertinent to Current Situation/Hospitalization:  No - Comment as needed Significant Relationships:  Adult Children Lives with:  Self Do you feel safe going back to the place where you live?  No Need for family participation in patient care:  Yes (Comment)  Care giving concerns:  Patient is agreeable to go to short term rehab after hospital discharge.    Social Worker assessment / plan:  CSW Intern spoke with patient and daughter - Donnella Sham about discharge plans.  Patient was alert, lying down in bed and participating in conversation.  Patient stated that she is interested in going to [facility] Rehab after hospital discharge.  CSW Intern spoke with daughter Judson Roch about family involvement and Judson Roch stated that she lives out of town but will be involved in her mother's care.  Patient has a cousin that lives in the [place] area that will assist her if need be.  CSW Intern explained the facility search process with the patient and her daughter and encouraged both to choose a back up facility in the event that [facility] is unable to accept the patient.  Patient will discuss with daughter and follow up with CSW on Friday.    Employment status:  Retired Forensic scientist:  Medicare PT Recommendations:  [place] / Referral to community resources:  [place]  Patient/Family's Response to care:  Patient and daughter expressed no concerns about hospital stay.  Patient/Family's Understanding of and Emotional Response to Diagnosis, Current Treatment, and Prognosis:  Not discussed  Emotional Assessment Appearance:  Appears stated age Attitude/Demeanor/Rapport:   (Appropriate) Affect (typically observed):  Appropriate Orientation:  Oriented to Self, Oriented to Place, Oriented to  Time, Oriented to Situation Alcohol / Substance use:  Not Applicable Psych involvement (Current and /or in the community):  No (Comment)  Discharge Needs  Concerns to be addressed:  No discharge needs identified Readmission within the last 30 days:  No Current discharge risk:  None Barriers to Discharge:  No Barriers Identified   Linward Headland, [place] Work 12/16/2016, 3:30 PM

## 2016-12-16 NOTE — Clinical Social Work Placement (Signed)
   CLINICAL SOCIAL WORK PLACEMENT  NOTE  Date:  12/16/2016  Patient Details  Name: Stephanie Cochran MRN: [id number] Date of Birth: 1918/03/30  Clinical Social Work is seeking post-discharge placement for this patient at the [place] level of care (*CSW will initial, date and re-position this form in  chart as items are completed):  Yes   Patient/family provided with [place] Work Department's list of facilities offering this level of care within the geographic area requested by the patient (or if unable, by the patient's family).  Yes   Patient/family informed of their freedom to choose among providers that offer the needed level of care, that participate in Medicare, Medicaid or managed care program needed by the patient, have an available bed and are willing to accept the patient.  Yes   Patient/family informed of [facility]'s ownership interest in [facility] and [facility], as well as of the fact that they are under no obligation to receive care at these facilities.  PASRR submitted to EDS on 12/16/16     PASRR number received on 12/16/16     Existing PASRR number confirmed on       FL2 transmitted to all facilities in geographic area requested by pt/family on 12/16/16     FL2 transmitted to all facilities within larger geographic area on       Patient informed that his/her managed care company has contracts with or will negotiate with certain facilities, including the following:            Patient/family informed of bed offers received.  Patient chooses bed at       Physician recommends and patient chooses bed at      Patient to be transferred to   on  .  Patient to be transferred to facility by       Patient family notified on   of transfer.  Name of family member notified:        PHYSICIAN       Additional Comment:    _______________________________________________ Sable Feil, LCSW 12/16/2016, 5:42  PM

## 2016-12-16 NOTE — Care Management Note (Signed)
Case Management Note  Patient Details  Name: FRITZIE PRIOLEAU MRN: [id number] Date of Birth: 08-15-1918  Subjective/Objective:  Pt in with near syncope. She is from home alone.                    Action/Plan: PT/OT recommending SNF. CM following for d/c needs.   Expected Discharge Date:                  Expected Discharge Plan:  [place]  In-House Referral:     Discharge planning Services     Post Acute Care Choice:    Choice offered to:     DME Arranged:    DME Agency:     HH Arranged:    [place] Agency:     Status of Service:  In process, will continue to follow  If discussed at Long Length of Stay Meetings, dates discussed:    Additional Comments:  Pollie Friar, RN 12/16/2016, 1:03 PM

## 2016-12-16 NOTE — Progress Notes (Signed)
Physical Therapy Treatment Patient Details Name: Stephanie Cochran MRN: [id number] DOB: 02/08/1918 Today's Date: 12/16/2016    History of Present Illness Pt is a 83 y.o. female who presented to the ED with facial droop and confusion concerning for stroke.  She was found to have elevated troponins. Head CT negative for acute CVA. CTA revealed L MCA occlusion. Pt has a PMH significant for: aortic stenosis, arthritis, atrial flutter, breast cancers, CAD, diverticulosis, HLD, HTN, Mobitz II, paroxysmal artiral fibrillation, post-menopausal, peripheral vascular disease.    PT Comments    Pt progressing towards physical therapy goals. Was able to perform transfers and ambulation with gross min guard up to mod assist at times to complete bed mobility transfers. Continue to feel that SNF is appropriate at d/c. Will continue to follow.    Follow Up Recommendations  SNF;Supervision/Assistance - 24 hour     Equipment Recommendations  None recommended by PT    Recommendations for Other Services       Precautions / Restrictions Precautions Precautions: Fall;ICD/Pacemaker Precaution Comments: Pacemaker Restrictions Weight Bearing Restrictions: No    Mobility  Bed Mobility Overal bed mobility: Needs Assistance Bed Mobility: Supine to Sit     Supine to sit: Mod assist     General bed mobility comments: verbal cues for supine to sit with physical assistance to scoot to EOB  Transfers Overall transfer level: Needs assistance Equipment used: Rolling walker (2 wheeled) Transfers: Sit to/from Stand Sit to Stand: Min guard         General transfer comment: cues for hand placement up/down from chair and commode  Ambulation/Gait Ambulation/Gait assistance: Min guard Ambulation Distance (Feet): 25 Feet Assistive device: Rolling walker (2 wheeled) Gait Pattern/deviations: Trunk flexed;Decreased stride length;Step-through pattern Gait velocity: decreased Gait velocity interpretation:  Below normal speed for age/gender General Gait Details: pt needed VC's for ambulation and refused to walk outside room. pt agreed to get up once told we can take her to BR. pt instructed to lift head and stand straight x 2. Mild instability w/ no LOB. Safety cues to navigate in room.    Stairs            Wheelchair Mobility    Modified Rankin (Stroke Patients Only) Modified Rankin (Stroke Patients Only) Pre-Morbid Rankin Score: Moderate disability Modified Rankin: Moderately severe disability     Balance Overall balance assessment: Needs assistance Sitting-balance support: No upper extremity supported Sitting balance-Leahy Scale: Fair     Standing balance support: Bilateral upper extremity supported Standing balance-Leahy Scale: Poor Standing balance comment: using rw and physical assist for support                    Cognition Arousal/Alertness: Awake/alert Behavior During Therapy: WFL for tasks assessed/performed Overall Cognitive Status: Impaired/Different from baseline Area of Impairment: Attention;Safety/judgement   Current Attention Level: Sustained     Safety/Judgement: Decreased awareness of safety;Decreased awareness of deficits          Exercises      General Comments        Pertinent Vitals/Pain Pain Assessment: No/denies pain    Home Living                      Prior Function            PT Goals (current goals can now be found in the care plan section) Acute Rehab PT Goals PT Goal Formulation: With patient/family Time For Goal Achievement: 12/29/16 Potential to Achieve Goals:  Good Progress towards PT goals: Progressing toward goals    Frequency    Min 4X/week      PT Plan Current plan remains appropriate    Co-evaluation             End of Session Equipment Utilized During Treatment: Gait belt Activity Tolerance: Patient tolerated treatment well Patient left: in chair;with call bell/phone within  reach;with chair alarm set;with family/visitor present Nurse Communication: Mobility status PT Visit Diagnosis: Unsteadiness on feet (R26.81);Muscle weakness (generalized) (M62.81);History of falling (Z91.81)     Time: 8251-8984 PT Time Calculation (min) (ACUTE ONLY): 25 min  Charges:  $Therapeutic Activity: 23-37 mins                    G Codes:       Thelma Comp Jan 02, 2017, 2:41 PM  Rolinda Roan, PT, DPT Acute Rehabilitation Services Pager: [phone]

## 2016-12-16 NOTE — Patient Outreach (Signed)
[facility]) Care Management  12/16/2016  DARRELYN MORRO 09/12/1918 [id number]   CSW noted that patient remains hospitalized for Confusion, Elevated Troponin and Encephalopathy, just to name a few.  Patient underwent a 2D Echocardiogram on Wednesday, March 21st, and results are still pending.  CSW will continue to follow patient while hospitalized to assess and assist with any possible discharge planning needs and services.  CSW will make arrangements to contact patient within the next week to follow-up regarding her medical status. Nat Christen, BSW, MSW, LCSW  Licensed Education officer, [facility]  Mailing [place] [address], [place], [place] [address] Physical Address-[address], [place], [place] [address] Toll Free Main # [phone] Fax # [phone] Cell # [phone]  Office # [email or web address]

## 2016-12-17 ENCOUNTER — Ambulatory Visit: Payer: Medicare Other | Admitting: *Deleted

## 2016-12-17 MED ORDER — BISACODYL 10 MG RE SUPP
10.0000 mg | Freq: Once | RECTAL | Status: AC
  Administered 2016-12-17: 10 mg via RECTAL
  Filled 2016-12-17: qty 1

## 2016-12-17 NOTE — Progress Notes (Addendum)
qPhysical Therapy Treatment Patient Details Name: Stephanie Cochran MRN: [id number] DOB: May 03, 1918 Today's Date: 12/17/2016    History of Present Illness Pt is a 83 y.o. female who presented to the ED with facial droop and confusion concerning for stroke.  She was found to have elevated troponins. Head CT negative for acute CVA. CTA revealed L MCA occlusion. Pt has a PMH significant for: aortic stenosis, arthritis, atrial flutter, breast cancers, CAD, diverticulosis, HLD, HTN, Mobitz II, paroxysmal artiral fibrillation, post-menopausal, peripheral vascular disease.    PT Comments    Pt performed increased gait distance and remains to require +1 external assistance for safety.  LOB noted x2 without UE support in standing.  Pt will continue to require SNF placement post d/c to improve strength and functional mobility.      Follow Up Recommendations  SNF;Supervision/Assistance - 24 hour     Equipment Recommendations  None recommended by PT    Recommendations for Other Services       Precautions / Restrictions Precautions Precautions: Fall;ICD/Pacemaker Precaution Comments: Pacemaker Restrictions Weight Bearing Restrictions: No    Mobility  Bed Mobility Overal bed mobility: Needs Assistance Bed Mobility: Supine to Sit;Sit to Supine     Supine to sit: Min guard Sit to supine: Min guard   General bed mobility comments: verbal cues for supine to sit with physical assistance to scoot to EOB  Transfers Overall transfer level: Needs assistance Equipment used: Rolling walker (2 wheeled) Transfers: Sit to/from Stand Sit to Stand: Min assist         General transfer comment: cues for hand placement up/down from chair and assist to boost into standing.    Ambulation/Gait Ambulation/Gait assistance: Min guard Ambulation Distance (Feet): 50 Feet Assistive device: Rolling walker (2 wheeled)   Gait velocity: decreased Gait velocity interpretation: Below normal speed for  age/gender General Gait Details: Pt agreeable to ambulate in halls this pm.  Pt required cues for upper trunk control, RW safety, increasing stride length and maneuvering RW safely.     Stairs            Wheelchair Mobility    Modified Rankin (Stroke Patients Only) Modified Rankin (Stroke Patients Only) Pre-Morbid Rankin Score: Moderate disability Modified Rankin: Moderately severe disability     Balance Overall balance assessment: Needs assistance   Sitting balance-Leahy Scale: Fair       Standing balance-Leahy Scale: Poor Standing balance comment: Pt with intermittent standing trials without UE support and unilateral support, requires min assist to correct LOB x2.                             Cognition Arousal/Alertness: Awake/alert Behavior During Therapy: WFL for tasks assessed/performed Overall Cognitive Status: Impaired/Different from baseline Area of Impairment: Attention;Safety/judgement                 Orientation Level: Disoriented to;Time;Situation;Place Current Attention Level: Sustained Memory: Decreased short-term memory Following Commands: Follows one step commands with increased time Safety/Judgement: Decreased awareness of safety;Decreased awareness of deficits Awareness: Intellectual Problem Solving: Slow processing;Decreased initiation;Requires tactile cues General Comments: Pt needing verbal + tactile cues for sequence of activity. Reorientation to task needed.       Exercises      General Comments        Pertinent Vitals/Pain Pain Assessment: No/denies pain    Home Living  Prior Function            PT Goals (current goals can now be found in the care plan section) Acute Rehab PT Goals Patient Stated Goal: be home Potential to Achieve Goals: Good Progress towards PT goals: Progressing toward goals    Frequency    Min 4X/week      PT Plan Current plan remains appropriate     Co-evaluation             End of Session Equipment Utilized During Treatment: Gait belt Activity Tolerance: Patient tolerated treatment well Patient left: with call bell/phone within reach;with family/visitor present;in bed;with bed alarm set Nurse Communication: Mobility status PT Visit Diagnosis: Unsteadiness on feet (R26.81);Muscle weakness (generalized) (M62.81);History of falling (Z91.81)     Time: 3614-4315 PT Time Calculation (min) (ACUTE ONLY): 31 min  Charges:  $Gait Training: 8-22 mins $Therapeutic Activity: 8-22 mins                    G Codes:       Governor Rooks, PTA pager [phone]    Cristela Blue 12/17/2016, 6:26 PM

## 2016-12-17 NOTE — Clinical Social Work Note (Signed)
Clinical Social Worker continuing to follow patient and family for support and discharge planning needs.  Patient and family have chosen a bed at [facility].  Patient daughter going to facility today to complete all paperwork with plans to admit on Saturday 03/24.  Patient and family aware of discharge plan and request transport via ambulance.  CSW remains available for support and to facilitate patient discharge needs.  Barbette Or, [place]

## 2016-12-17 NOTE — Progress Notes (Signed)
Cardiology will sign off, will see again prn.  Rosaria Ferries, Hershal Coria 12/17/2016 9:49 AM Beeper [phone]

## 2016-12-17 NOTE — Progress Notes (Signed)
PROGRESS NOTE   Stephanie Cochran  IRS:[id number]    DOB: 10/14/1917    DOA: 12/14/2016  PCP: Cari Caraway, MD   I have briefly reviewed patients previous medical records in [facility].  Brief Narrative:  83 year old female, lives alone, ambulates with the help of a cane/walker, PMH of heart block status post PPM, CAD status post CABG, RAS status post stenting, HLD, aVR, breast cancer status post mastectomy, gradual decline over the last 3-4 months, ED visit early March after a fall at home, presented to ED on 12/14/16 with new onset confusion, mumbling, facial droop and EMS noted nonverbal. By the time patient arrived in the ED, speech had normalized, facial droop had resolved but she remained confused. CT head without acute findings. CTA concerning left MCA occlusion-admitting physician discussed with neurology who did not recommend any further evaluation for that. Unable to do MRI due to pacemaker. Admitted for possible CVA.   Assessment & Plan:   Active Problems:   Coronary artery disease   Hypertension   Hyperlipidemia   Risk for falls   Near syncope   Acute encephalopathy   Elevated troponin   Anemia   Acute encephalopathy - Unclear etiology - No significant metabolic derangements or clinical infectious etiology. No new medications per family. - TSH and B12 unremarkable. - Mental status had improved but again worsened overnight 3/21. Based on history, likely has dementia and sundowning and hospitalization likely contributing. - Urine microscopy: Negative for UTI features. - Mental status has improved but not back to baseline.  Possible CVA - Based on initial history.  - CT head without acute findings - CTA head and neck: No emergent large vessel occlusion. 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 No hemodynamically significant carotid stenosis  in the neck moderate stenosis of left vertebral artery origin no acute cortically based infarct. - Unable to do MRI due to pacemaker. - Follow 2-D echo: LVEF 60-65 percent. - Hemoglobin A1c: 5.2 - LDL 133 - Continue aspirin 81 MG daily and Plavix. Would benefit from anticoagulation given history of PAF but history of falls and hence continuing off of anticoagulation. Discussed with stroke M.D. on call today who recommended no change in antiplatelet therapy. - therapies recommend SNF. - Confusion has improved but mental status not yet at baseline. Also patient not safe to return home. Discussed in detail with patient's daughter, case management and plans are for [facility] 3/24  Elevated troponin  - As per cardiology not consistent with ACS and no further cardiac evaluation recommended.   PAF - Not anticoagulation candidate secondary to advanced age, frail physical status and fall risk. Continue carvedilol and aspirin.  Essential hypertension - Continue carvedilol. Controlled.  CAD status post CABG - Continue aspirin and Plavix.  Status post tissue aVR  Adult failure to thrive - Multifactorial including advanced age, some cognitive impairment at baseline and multiple comorbidities. May consider palliative care consultation as outpatient.  Left shoulder pain, status post fall - Left shoulder x-ray without acute findings.  Anemia - Stable    DVT prophylaxis: Lovenox Code Status: DO NOT RESUSCITATE  Family Communication: Discussed in detail with patient's daughter at bedside.  Disposition: DC to SNF possibly 3/24.   Consultants:  Cardiology   Procedures:  2-D echo 12/15/16: Study Conclusions  - Left ventricle: The cavity size was normal. Wall thickness was   normal. Systolic function was normal. The estimated  ejection   fraction was in the range of 60% to 65%. Wall motion was normal;   there were no regional wall motion abnormalities. Features are   consistent with a  pseudonormal left ventricular filling pattern,   with concomitant abnormal relaxation and increased filling   pressure (grade 2 diastolic dysfunction). - Aortic valve: There was a bioprosthetic aortic valve. There was   no stenosis. There was no regurgitation. Mean gradient (S): 4 mm   Hg. - Mitral valve: Moderately to severely calcified annulus. There was   mild regurgitation. - Left atrium: The atrium was mildly dilated. - Right ventricle: The cavity size was normal. Pacer wire or   catheter noted in right ventricle. Systolic function was mildly   reduced. - Tricuspid valve: Peak RV-RA gradient (S): 32 mm Hg. - Pulmonary arteries: PA peak pressure: 35 mm Hg (S). - Inferior vena cava: The vessel was normal in size. The   respirophasic diameter changes were in the normal range (= 50%),   consistent with normal central venous pressure.  Impressions:  - Normal LV size with mild LV hypertrophy. EF 60-65%. Moderate   diastolic dysfunction. Normal RV size with mildly decreased   systolic function. Bioprosthetic aortic valve appeared to   function normally. Mild mitral regurgitation.  Antimicrobials:  None   Subjective: Seen this morning. Patient was eating her breakfast. States that she feels "okay". Oriented to self and place. As per daughter at bedside, mental status/confusion has improved since last night but not yet at baseline. She is extremely concerned regarding patient's safety if she returns home by herself (patient was living alone).  ROS: No shoulder pain reported today.  Objective:  Vitals:   12/16/16 2227 12/17/16 0158 12/17/16 0550 12/17/16 1038  BP: 136/63 (!) 129/57 (!) 107/49 118/60  Pulse: 70 69 70 69  Resp: 16 18 20 17   Temp: 98 F (36.7 C) 97.5 F (36.4 C) 98 F (36.7 C) 98.2 F (36.8 C)  TempSrc: Oral Oral Oral Oral  SpO2: 98% 99% 100% 100%  Weight:      Height:        Examination:  General exam: Pleasant elderly female lying comfortably propped  up in bed. Respiratory system: Clear to auscultation. Respiratory effort normal. Cardiovascular system: S1 & S2 heard, RRR. No JVD, murmurs, rubs, gallops or clicks. No pedal edema. telemetry: AV paced rhythm.  Gastrointestinal system: Abdomen is nondistended, soft and nontender. No organomegaly or masses felt. Normal bowel sounds heard. Central nervous system: Alert and oriented to person, partly to place but not to time. No focal neurological deficits. Extremities: Symmetric 5 x 5 power. Left shoulder movements restricted secondary to pain but no external acute exam findings  Skin: No rashes, lesions or ulcers Psychiatry: Judgement and insight impaired. Mood & affect appropriate.     Data Reviewed: I have personally reviewed following labs and imaging studies  CBC:  Recent Labs Lab 12/14/16 0932 12/14/16 0943 12/14/16 1554 12/15/16 0333  WBC 4.8  --  5.0 4.4  NEUTROABS 2.1  --   --   --   HGB 11.4* 11.2* 10.8* 10.4*  HCT 33.9* 33.0* 32.2* 31.1*  MCV 98.3  --  96.7 97.5  PLT 224  --  211 299   Basic Metabolic Panel:  Recent Labs Lab 12/14/16 0932 12/14/16 0943 12/14/16 1554 12/15/16 0333  NA 136 137  --  136  K 4.3 4.2  --  3.8  CL 102 102  --  104  CO2 26  --   --  23  GLUCOSE 111* 107*  --  85  BUN 15 17  --  11  CREATININE 0.93 1.00 0.81 0.83  CALCIUM 8.8*  --   --  8.5*   Liver Function Tests:  Recent Labs Lab 12/14/16 0932  AST 23  ALT 7*  ALKPHOS 40  BILITOT 0.9  PROT 6.2*  ALBUMIN 3.5   Coagulation Profile:  Recent Labs Lab 12/14/16 0932  INR 1.04   Cardiac Enzymes:  Recent Labs Lab 12/14/16 1554 12/14/16 1932 12/15/16 0333  TROPONINI 0.13* 0.13* 0.10*   HbA1C:  Recent Labs  12/15/16 0333  HGBA1C 5.2   CBG:  Recent Labs Lab 12/14/16 0933  GLUCAP 95    No results found for this or any previous visit (from the past 240 hour(s)).       Radiology Studies: Dg Shoulder Left  Result Date: 12/15/2016 CLINICAL DATA:  Left  shoulder pain. EXAM: LEFT SHOULDER - 2+ VIEW COMPARISON:  None. FINDINGS: The bones are diffusely osteopenic. There is a high-riding left humeral head with remodeling of the undersurface of the left acromion and sub chondral cystic change in the superior aspect of the left humeral head. A amorphous soft tissue calcification between the left acromion and left humeral head is again noted. Mild degenerative changes of the glenohumeral joint. No acute fracture or subluxation. No dislocation. IMPRESSION: 1. 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 Electronically Signed   By: Kerby Moors M.D.   On: 12/15/2016 18:33        Scheduled Meds: .  stroke: mapping our early stages of recovery book   Does not apply Once  . aspirin EC  81 mg Oral Daily  . bisacodyl  10 mg Rectal Once  . carvedilol  6.25 mg Oral Daily  . cholecalciferol  2,000 Units Oral Daily  . clopidogrel  75 mg Oral Daily  . enoxaparin (LOVENOX) injection  30 mg Subcutaneous Q24H  . vitamin B-12  1,000 mcg Oral Daily   Continuous Infusions: . sodium chloride       LOS: 2 days     Seema Blum, MD, FACP, FHM. [facility] Pager [phone]  If 7PM-7AM, please contact night-coverage [email or web address] Password [email or web address] 12/17/2016, 2:25 PM

## 2016-12-18 DIAGNOSIS — I639 Cerebral infarction, unspecified: Principal | ICD-10-CM

## 2016-12-18 MED ORDER — SENNOSIDES-DOCUSATE SODIUM 8.6-50 MG PO TABS: 1.0000 | ORAL_TABLET | Freq: Every evening | ORAL | Status: DC | PRN

## 2016-12-18 MED ORDER — ROSUVASTATIN CALCIUM 10 MG PO TABS: 10.0000 mg | ORAL_TABLET | Freq: Every day | ORAL | Status: AC

## 2016-12-18 NOTE — Discharge Summary (Signed)
Physician Discharge Summary  Stephanie Cochran XBL:[id number] DOB: Feb 05, 1918  PCP: Cari Caraway, MD  Admit date: 12/14/2016 Discharge date: 12/18/2016  Recommendations for Outpatient Follow-up:  1. M.D. at SNF in 3 days with repeat labs (CBC & BMP). 2. Dr. Cari Caraway, PCP: Upon discharge from SNF.  Home Health: None Equipment/Devices: None    Discharge Condition: Improved and stable  CODE STATUS: DO NOT RESUSCITATE  Diet recommendation: Heart healthy diet.  Discharge Diagnoses:  Active Problems:   Coronary artery disease   Hypertension   Hyperlipidemia   Risk for falls   Near syncope   Acute encephalopathy   Elevated troponin   Anemia   Brief Summary: 83 year old female, lives alone, ambulates with the help of a cane/walker, PMH of heart block status post PPM, CAD status post CABG, RAS status post stenting, HLD, aVR, breast cancer status post mastectomy, gradual decline over the last 3-4 months, ED visit early March after a fall at home, presented to ED on 12/14/16 with new onset confusion, mumbling, facial droop and EMS noted nonverbal. By the time patient arrived in the ED, speech had normalized, facial droop had resolved but she remained confused. CT head without acute findings. CTA concerning left MCA occlusion-admitting physician discussed with neurology who did not recommend any further evaluation for that. Unable to do MRI due to pacemaker. Admitted for possible CVA.   Assessment & Plan:   Acute encephalopathy - Unclear etiology - No significant metabolic derangements or clinical infectious etiology. No new medications per family. - TSH and B12 unremarkable. - Mental status had improved but again worsened overnight 3/21. Based on history, likely has dementia and sundowning and hospitalization likely contributing. - Urine microscopy: Negative for UTI features. - Mental status has improved & back to baseline.  Possible CVA - Based on initial history.  - CT head  without acute findings - CTA head and neck: No emergent large vessel occlusion. 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 No hemodynamically significant carotid stenosis in the neck moderate stenosis of left vertebral artery origin no acute cortically based infarct. - Unable to do MRI due to pacemaker. - 2-D echo: LVEF 60-65 percent. - Hemoglobin A1c: 5.2 - LDL 133 - Continue aspirin 81 MG daily and Plavix. Would benefit from anticoagulation given history of PAF but history of falls and hence continuing off of anticoagulation. Discussed with stroke M.D. on call on 3/23 who recommended no change in antiplatelet therapy. - therapies recommend SNF. - Patient not safe to return home. Patient discharging to SNF today. Daughter asking questions about post SNF discharge plans and advised her to coordinate with SNF and may require long-term care facility.  Hyperlipidemia LDL 133, goal <70. Increased Crestor from 5 to10 mg daily  Elevated troponin  - As per cardiology not consistent with ACS and no further cardiac evaluation recommended.   PAF - Not anticoagulation candidate secondary to advanced age, frail physical status and fall risk. Continue carvedilol , aspirin and Plavix.  Essential hypertension - Continue carvedilol. Controlled. Amlodipine discontinued.  CAD status post CABG - Continue aspirin, Plavix, statins and beta blockers  Status post tissue aVR  Adult failure to thrive - Multifactorial including advanced age, some cognitive impairment at baseline and multiple comorbidities. May consider palliative care consultation as outpatient.  Left shoulder pain, status post fall - Left shoulder x-ray without acute findings. Has not reported of much pain in the  last 2 days.  Anemia - Stable    Consultants:  Cardiology   Procedures:  2-D echo  12/15/16: Study Conclusions  - Left ventricle: The cavity size was normal. Wall thickness was normal. Systolic function was normal. The estimated ejection fraction was in the range of 60% to 65%. Wall motion was normal; there were no regional wall motion abnormalities. Features are consistent with a pseudonormal left ventricular filling pattern, with concomitant abnormal relaxation and increased filling pressure (grade 2 diastolic dysfunction). - Aortic valve: There was a bioprosthetic aortic valve. There was no stenosis. There was no regurgitation. Mean gradient (S): 4 mm Hg. - Mitral valve: Moderately to severely calcified annulus. There was mild regurgitation. - Left atrium: The atrium was mildly dilated. - Right ventricle: The cavity size was normal. Pacer wire or catheter noted in right ventricle. Systolic function was mildly reduced. - Tricuspid valve: Peak RV-RA gradient (S): 32 mm Hg. - Pulmonary arteries: PA peak pressure: 35 mm Hg (S). - Inferior vena cava: The vessel was normal in size. The respirophasic diameter changes were in the normal range (= 50%), consistent with normal central venous pressure.  Impressions:  - Normal LV size with mild LV hypertrophy. EF 60-65%. Moderate diastolic dysfunction. Normal RV size with mildly decreased systolic function. Bioprosthetic aortic valve appeared to function normally. Mild mitral regurgitation.   Discharge Instructions  Discharge Instructions    Call MD for:    Complete by:  As directed    Strokelike symptoms or recurrence/worsening confusion.   Call MD for:  severe uncontrolled pain    Complete by:  As directed    Diet - low sodium heart healthy    Complete by:  As directed    Increase activity slowly    Complete by:  As directed        Medication List    STOP taking these medications   amLODipine 2.5 MG tablet Commonly known as:  NORVASC   lidocaine 5 % Commonly known as:   LIDODERM     TAKE these medications   aspirin EC 81 MG tablet Take 81 mg by mouth daily.   B-12 1000 MCG Caps Take 1,000 mcg by mouth daily.   carvedilol 6.25 MG tablet Commonly known as:  COREG Take 1 tablet (6.25 mg total) by mouth daily.   clopidogrel 75 MG tablet Commonly known as:  PLAVIX Take 75 mg by mouth daily.   diphenhydramine-acetaminophen 25-500 MG Tabs tablet Commonly known as:  TYLENOL PM Take 0.5 tablets by mouth at bedtime as needed (sleep).   ondansetron 4 MG disintegrating tablet Commonly known as:  ZOFRAN ODT Take 1 tablet (4 mg total) by mouth every 8 (eight) hours as needed for nausea or vomiting.   rosuvastatin 10 MG tablet Commonly known as:  CRESTOR Take 1 tablet (10 mg total) by mouth daily. What changed:  medication strength  how much to take   SALONPAS PAIN RELIEF PATCH EX Apply 1 patch topically daily as needed.   senna-docusate 8.6-50 MG tablet Commonly known as:  Senokot-S Take 1 tablet by mouth at bedtime as needed for mild constipation.   SYSTANE OP Place 1 drop into both eyes 2 (two) times daily as needed (dry eyes).   vitamin C 500 MG tablet Commonly known as:  ASCORBIC ACID Take 1,000 mg by mouth daily.   Vitamin D (Ergocalciferol) 50000 units Caps capsule Commonly known as:  DRISDOL Take 1 capsule (50,000 Units total) by mouth every 30 (thirty) days. On or  about the 1st of each month   Vitamin D 2000 units tablet Take 2,000 Units by mouth daily.   vitamin E 400 UNIT capsule Take 400 Units by mouth daily. Reported on 12/12/2015       Contact information for follow-up providers    M.D. at SNF. Schedule an appointment as soon as possible for a visit in 3 day(s).   Why:  To be seen with repeat labs (CBC & BMP).       MCNEILL,WENDY, MD. Schedule an appointment as soon as possible for a visit.   Specialty:  Family Medicine Why:  Upon discharge from SNF. Contact information: [place]  [phone]            Contact information for after-discharge care    Destination    [facility] Follow up.   Specialty:  [place] information: [place] [phone]                 Allergies  Allergen Reactions  . Codeine Nausea And Vomiting  . Penicillins Nausea And Vomiting  . Vicodin [Hydrocodone-Acetaminophen] Nausea And Vomiting      Procedures/Studies: Ct Angio Head W/cm &/or Wo Cm  Addendum Date: 12/14/2016   ADDENDUM REPORT: 12/14/2016 16:09 ADDENDUM: Study discussed by telephone with Dr. Eulis Foster in the ED on 12/14/2016 at 1604 hours. Electronically Signed   By: Genevie Ann M.D.   On: 12/14/2016 16:09   Result Date: 12/14/2016 CLINICAL DATA:  83 year old female with right facial droop and loss of speech today. Initial encounter. EXAM: CT ANGIOGRAPHY HEAD AND NECK TECHNIQUE: Multidetector CT imaging of the head and neck was performed using the standard protocol during bolus administration of intravenous contrast. Multiplanar CT image reconstructions and MIPs were obtained to evaluate the vascular anatomy. Carotid stenosis measurements (when applicable) are obtained utilizing NASCET criteria, using the distal internal carotid diameter as the denominator. CONTRAST:  50 mL Isovue 370 COMPARISON:  Head CT without contrast 1100 hours today. FINDINGS: CTA NECK Skeleton: Previous median sternotomy. Osteopenia. Degenerative changes. Exaggerated cervical lordosis and thoracic kyphosis with partially visible mild thoracic scoliosis. Visualized paranasal sinuses and mastoids are stable and well pneumatized. Upper chest: Partially visible left chest cardiac pacemaker or AICD device. Small layering pleural effusions. Mild pulmonary septal thickening. Mild upper lobe mosaic attenuation. No superior mediastinal lymphadenopathy. Other neck: Negative thyroid, larynx, pharynx, parapharyngeal spaces,  retropharyngeal space, sublingual space, submandibular glands and parotid glands. No cervical lymphadenopathy. Aortic arch: The entire aortic arch is not included. Three vessel arch configuration suspected. Moderate soft and calcified arch atherosclerosis. Right carotid system: Visible brachiocephalic artery and right CCA origin are without stenosis. Mildly tortuous right CCA. Mild calcified plaque at the right carotid bifurcation with no proximal right ICA stenosis. Tortuous cervical right ICA with a mildly kinked appearance an mildly beaded appearance in the tortuous segment. No beading of the distal cervical right ICA. Left carotid system: Left CCA origin not included. No left CCA stenosis proximal to the bifurcation. Calcified plaque at the left carotid bifurcation extending into the left ICA origin and bulb, but less than 50 % stenosis with respect to the distal vessel results. Tortuous left ICA distal to the bulb with a kinked appearance at the level of the submandibular gland. No definite beading of the cervical left ICA. Vertebral arteries:No proximal right subclavian artery stenosis despite calcified plaque. Mild plaque at the right vertebral artery origin and  V1 segment without stenosis. Mildly tortuous but otherwise negative cervical right vertebral artery. No proximal left subclavian artery stenosis despite abundant calcified plaque. Calcified plaque at the left vertebral artery origin with mild to moderate origin stenosis. Tortuous but otherwise negative cervical left vertebral artery. CTA HEAD Posterior circulation: Mildly beaded appearance of the distal left V3 and V4 segments. No distal left vertebral artery stenosis. Patent left PICA origin. Moderate calcified atherosclerosis of the right vertebral artery V4 segment with mild to moderate stenosis. The right PICA origin remains normal. The distal right vertebral artery is patent to the vertebrobasilar junction. No basilar stenosis. AICA, SCA, and PCA  origins are patent. Fetal type left PCA origin. The right posterior communicating artery is diminutive or absent. Normal bilateral PCA branches. Anterior circulation: Both ICA siphons are patent. There is moderate to severe calcified plaque affecting the siphons, worse on the left. Associated moderate to severe supraclinoid left ICA stenosis. The left ICA terminus remains patent. More moderate appearing right supraclinoid ICA stenosis. The right ICA terminus remains patent. The ophthalmic and left posterior communicating artery origins are normal. Normal MCA and ACA origins. Diminutive or absent anterior communicating artery. Bilateral ACA branches are within normal limits. Right MCA M1 segment, right MCA bifurcation and right MCA branches are within normal limits. Left MCA M1 segment, left MCA bifurcation, and proximal left MCA M2 segments are normal. There is a posterior distal left M3 branch occlusion best demonstrated on series 506, image 32. Other left MCA branches appear within normal limits. Venous sinuses: Patent. Anatomic variants: Fetal type left PCA origin. Delayed phase: No definite acute cortically based infarct is identified. No abnormal enhancement identified. Review of the MIP images confirms the above findings IMPRESSION: 1. Negative for emergent large vessel occlusion but positive for a distal posterior Left MCA distal M3 branch occlusion. 2. Moderate to severe bilateral ICA siphon calcified plaque and siphon stenosis, maximal at the left supraclinoid segment. 3. No hemodynamically significant carotid stenosis in the neck. Possible cervical right ICA fibromuscular dysplasia (FMD). 4. Up to moderate stenosis of the left vertebral artery origin, and the right vertebral artery V4 segment. Possible distal left vertebral artery FMD. 5. Stable non contrast CT appearance of the brain. No acute cortically based infarct identified at this time. 6. Small layering pleural effusions. Upper lobe septal  thickening and mosaic attenuation may represent a combination of mild interstitial edema and gas trapping. Electronically Signed: By: Genevie Ann M.D. On: 12/14/2016 16:01   Dg Chest 2 View  Result Date: 12/14/2016 CLINICAL DATA:  Facial droop and altered mental status. Hypertension. EXAM: CHEST  2 VIEW COMPARISON:  November 30, 2016 FINDINGS: There is mild interstitial edema. No airspace consolidation. Heart is upper normal in size with mild pulmonary venous hypertension. Pacemaker leads are attached to the right atrium and right ventricle. There is calcification in the mitral annulus. Patient has had aortic valve replacement as well as coronary artery bypass grafting. No adenopathy. Bones are osteoporotic. There is degenerative change in each shoulder with chronic rotator cuff tears bilaterally. There is aortic atherosclerosis. IMPRESSION: Findings felt to represent a degree of congestive heart failure, mild by radiography. No airspace consolidation. Aortic atherosclerosis. Electronically Signed   By: Lowella Grip III M.D.   On: 12/14/2016 15:01   Ct Head Wo Contrast  Result Date: 12/14/2016 CLINICAL DATA:  Resolved stroke symptoms EXAM: CT HEAD WITHOUT CONTRAST TECHNIQUE: Contiguous axial images were obtained from the base of the skull through the vertex without intravenous contrast.  COMPARISON:  None. FINDINGS: Brain: No evidence of acute infarction, hemorrhage, hydrocephalus, extra-axial collection or mass lesion/mass effect. Global cortical atrophy. Right basal ganglia lacunar infarct. Subcortical white matter and periventricular small vessel ischemic changes. Vascular: Intracranial atherosclerosis. Skull: Normal. Negative for fracture or focal lesion. Sinuses/Orbits: The visualized paranasal sinuses are essentially clear. The mastoid air cells are unopacified. Other: None. IMPRESSION: No evidence of acute intracranial abnormality. Atrophy with small vessel ischemic changes. Old right basal ganglia  lacunar infarct. Electronically Signed   By: Julian Hy M.D.   On: 12/14/2016 11:07   Ct Angio Neck W/cm &/or Wo/cm  Addendum Date: 12/14/2016   ADDENDUM REPORT: 12/14/2016 16:09 ADDENDUM: Study discussed by telephone with Dr. Eulis Foster in the ED on 12/14/2016 at 1604 hours. Electronically Signed   By: Genevie Ann M.D.   On: 12/14/2016 16:09   Result Date: 12/14/2016 CLINICAL DATA:  83 year old female with right facial droop and loss of speech today. Initial encounter. EXAM: CT ANGIOGRAPHY HEAD AND NECK TECHNIQUE: Multidetector CT imaging of the head and neck was performed using the standard protocol during bolus administration of intravenous contrast. Multiplanar CT image reconstructions and MIPs were obtained to evaluate the vascular anatomy. Carotid stenosis measurements (when applicable) are obtained utilizing NASCET criteria, using the distal internal carotid diameter as the denominator. CONTRAST:  50 mL Isovue 370 COMPARISON:  Head CT without contrast 1100 hours today. FINDINGS: CTA NECK Skeleton: Previous median sternotomy. Osteopenia. Degenerative changes. Exaggerated cervical lordosis and thoracic kyphosis with partially visible mild thoracic scoliosis. Visualized paranasal sinuses and mastoids are stable and well pneumatized. Upper chest: Partially visible left chest cardiac pacemaker or AICD device. Small layering pleural effusions. Mild pulmonary septal thickening. Mild upper lobe mosaic attenuation. No superior mediastinal lymphadenopathy. Other neck: Negative thyroid, larynx, pharynx, parapharyngeal spaces, retropharyngeal space, sublingual space, submandibular glands and parotid glands. No cervical lymphadenopathy. Aortic arch: The entire aortic arch is not included. Three vessel arch configuration suspected. Moderate soft and calcified arch atherosclerosis. Right carotid system: Visible brachiocephalic artery and right CCA origin are without stenosis. Mildly tortuous right CCA. Mild calcified  plaque at the right carotid bifurcation with no proximal right ICA stenosis. Tortuous cervical right ICA with a mildly kinked appearance an mildly beaded appearance in the tortuous segment. No beading of the distal cervical right ICA. Left carotid system: Left CCA origin not included. No left CCA stenosis proximal to the bifurcation. Calcified plaque at the left carotid bifurcation extending into the left ICA origin and bulb, but less than 50 % stenosis with respect to the distal vessel results. Tortuous left ICA distal to the bulb with a kinked appearance at the level of the submandibular gland. No definite beading of the cervical left ICA. Vertebral arteries:No proximal right subclavian artery stenosis despite calcified plaque. Mild plaque at the right vertebral artery origin and V1 segment without stenosis. Mildly tortuous but otherwise negative cervical right vertebral artery. No proximal left subclavian artery stenosis despite abundant calcified plaque. Calcified plaque at the left vertebral artery origin with mild to moderate origin stenosis. Tortuous but otherwise negative cervical left vertebral artery. CTA HEAD Posterior circulation: Mildly beaded appearance of the distal left V3 and V4 segments. No distal left vertebral artery stenosis. Patent left PICA origin. Moderate calcified atherosclerosis of the right vertebral artery V4 segment with mild to moderate stenosis. The right PICA origin remains normal. The distal right vertebral artery is patent to the vertebrobasilar junction. No basilar stenosis. AICA, SCA, and PCA origins are patent. Fetal type left  PCA origin. The right posterior communicating artery is diminutive or absent. Normal bilateral PCA branches. Anterior circulation: Both ICA siphons are patent. There is moderate to severe calcified plaque affecting the siphons, worse on the left. Associated moderate to severe supraclinoid left ICA stenosis. The left ICA terminus remains patent. More  moderate appearing right supraclinoid ICA stenosis. The right ICA terminus remains patent. The ophthalmic and left posterior communicating artery origins are normal. Normal MCA and ACA origins. Diminutive or absent anterior communicating artery. Bilateral ACA branches are within normal limits. Right MCA M1 segment, right MCA bifurcation and right MCA branches are within normal limits. Left MCA M1 segment, left MCA bifurcation, and proximal left MCA M2 segments are normal. There is a posterior distal left M3 branch occlusion best demonstrated on series 506, image 32. Other left MCA branches appear within normal limits. Venous sinuses: Patent. Anatomic variants: Fetal type left PCA origin. Delayed phase: No definite acute cortically based infarct is identified. No abnormal enhancement identified. Review of the MIP images confirms the above findings IMPRESSION: 1. Negative for emergent large vessel occlusion but positive for a distal posterior Left MCA distal M3 branch occlusion. 2. Moderate to severe bilateral ICA siphon calcified plaque and siphon stenosis, maximal at the left supraclinoid segment. 3. No hemodynamically significant carotid stenosis in the neck. Possible cervical right ICA fibromuscular dysplasia (FMD). 4. Up to moderate stenosis of the left vertebral artery origin, and the right vertebral artery V4 segment. Possible distal left vertebral artery FMD. 5. Stable non contrast CT appearance of the brain. No acute cortically based infarct identified at this time. 6. Small layering pleural effusions. Upper lobe septal thickening and mosaic attenuation may represent a combination of mild interstitial edema and gas trapping. Electronically Signed: By: Genevie Ann M.D. On: 12/14/2016 16:01   Dg Chest Port 1 View  Result Date: 11/30/2016 CLINICAL DATA:  Dizziness and weakness. EXAM: PORTABLE CHEST 1 VIEW COMPARISON:  October 24, 2015 FINDINGS: The cardiomediastinal silhouette is stable and unremarkable. No  pneumothorax. No pulmonary nodules, mass, or focal infiltrates. Stable pacemaker. IMPRESSION: No active disease. Electronically Signed   By: Dorise Bullion III M.D   On: 11/30/2016 14:44   Dg Shoulder Left  Result Date: 12/15/2016 CLINICAL DATA:  Left shoulder pain. EXAM: LEFT SHOULDER - 2+ VIEW COMPARISON:  None. FINDINGS: The bones are diffusely osteopenic. There is a high-riding left humeral head with remodeling of the undersurface of the left acromion and sub chondral cystic change in the superior aspect of the left humeral head. A amorphous soft tissue calcification between the left acromion and left humeral head is again noted. Mild degenerative changes of the glenohumeral joint. No acute fracture or subluxation. No dislocation. IMPRESSION: 1. 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 Electronically Signed   By: Kerby Moors M.D.   On: 12/15/2016 18:33      Subjective: Seen this morning. No new complaints reported. As per daughter at bedside, mental status back to baseline. After suppository, patient had a large BM yesterday. No pain reported. As per RN, no acute issues.  Discharge Exam:  Vitals:   12/17/16 2224 12/18/16 0027 12/18/16 0658 12/18/16 0949  BP: 135/67 (!) 126/58 138/64 (!) 121/51  Pulse: 70 70 70 70  Resp: 16 16 16 16   Temp: 98.3 F (36.8 C) 97.8 F (36.6 C) 97.9 F (36.6 C) 97.7 F (36.5 C)  TempSrc: Oral Oral Oral Oral  SpO2:  97% 98% 99% 99%  Weight:      Height:        General exam: Pleasant elderly female lying comfortably propped up in bed. Respiratory system: Clear to auscultation. Respiratory effort normal. Cardiovascular system: S1 & S2 heard, RRR. No JVD, murmurs, rubs, gallops or clicks. No pedal edema.  Gastrointestinal system: Abdomen is nondistended, soft and nontender. No organomegaly or masses felt. Normal bowel sounds heard. Central nervous system: Alert and  oriented to person, partly to place but not to time. No focal neurological deficits. Extremities: Symmetric 5 x 5 power. Left shoulder movements restricted secondary to pain but no external acute exam findings  Skin: No rashes, lesions or ulcers Psychiatry: Judgement and insight impaired. Mood & affect appropriate.     The results of significant diagnostics from this hospitalization (including imaging, microbiology, ancillary and laboratory) are listed below for reference.     Microbiology: No results found for this or any previous visit (from the past 240 hour(s)).   Labs: CBC:  Recent Labs Lab 12/14/16 0932 12/14/16 0943 12/14/16 1554 12/15/16 0333  WBC 4.8  --  5.0 4.4  NEUTROABS 2.1  --   --   --   HGB 11.4* 11.2* 10.8* 10.4*  HCT 33.9* 33.0* 32.2* 31.1*  MCV 98.3  --  96.7 97.5  PLT 224  --  211 470   Basic Metabolic Panel:  Recent Labs Lab 12/14/16 0932 12/14/16 0943 12/14/16 1554 12/15/16 0333  NA 136 137  --  136  K 4.3 4.2  --  3.8  CL 102 102  --  104  CO2 26  --   --  25  GLUCOSE 111* 107*  --  85  BUN 15 17  --  11  CREATININE 0.93 1.00 0.81 0.83  CALCIUM 8.8*  --   --  8.5*   Liver Function Tests:  Recent Labs Lab 12/14/16 0932  AST 23  ALT 7*  ALKPHOS 40  BILITOT 0.9  PROT 6.2*  ALBUMIN 3.5   Cardiac Enzymes:  Recent Labs Lab 12/14/16 1554 12/14/16 1932 12/15/16 0333  TROPONINI 0.13* 0.13* 0.10*   CBG:  Recent Labs Lab 12/14/16 0933  GLUCAP 95   Urinalysis    Component Value Date/Time   COLORURINE STRAW (A) 12/14/2016 1109   APPEARANCEUR CLEAR 12/14/2016 1109   LABSPEC 1.008 12/14/2016 1109   PHURINE 7.0 12/14/2016 1109   GLUCOSEU NEGATIVE 12/14/2016 1109   [place] 12/14/2016 1109   BILIRUBINUR NEGATIVE 12/14/2016 1109   KETONESUR NEGATIVE 12/14/2016 1109   PROTEINUR NEGATIVE 12/14/2016 1109   UROBILINOGEN 0.2 05/13/2014 1822   NITRITE NEGATIVE 12/14/2016 1109   LEUKOCYTESUR NEGATIVE 12/14/2016 1109     Discussed in detail with patient's daughter at bedside. Updated care and answered questions.  Time coordinating discharge: Over 30 minutes  SIGNED:  Vernell Leep, MD, FACP, [place]. [facility] Pager [phone]  If 7PM-7AM, please contact night-coverage [email or web address] Password [facility] 12/18/2016, 12:48 PM

## 2016-12-18 NOTE — Progress Notes (Signed)
Report called to [facility] facility. No answer, on hold for second time for to give report.

## 2016-12-18 NOTE — Progress Notes (Signed)
Patient will DC to: [facility] Anticipated DC date: 12/18/16 Family notified: Daughter Transport by: Corey Harold    Per MD patient ready for DC to [facility]. RN, patient, patient's family, and facility notified of DC. Discharge Summary sent to facility. RN given number for report. DC packet on chart. Ambulance transport requested for patient.   CSW signing off.  Cedric Fishman, [place] Social Worker [phone]

## 2016-12-20 ENCOUNTER — Encounter: Payer: Self-pay | Admitting: Internal Medicine

## 2016-12-20 ENCOUNTER — Non-Acute Institutional Stay (SKILLED_NURSING_FACILITY): Payer: Medicare Other | Admitting: Internal Medicine

## 2016-12-20 DIAGNOSIS — I63512 Cerebral infarction due to unspecified occlusion or stenosis of left middle cerebral artery: Secondary | ICD-10-CM | POA: Diagnosis not present

## 2016-12-20 DIAGNOSIS — E784 Other hyperlipidemia: Secondary | ICD-10-CM

## 2016-12-20 DIAGNOSIS — R627 Adult failure to thrive: Secondary | ICD-10-CM

## 2016-12-20 DIAGNOSIS — D649 Anemia, unspecified: Secondary | ICD-10-CM | POA: Diagnosis not present

## 2016-12-20 DIAGNOSIS — I10 Essential (primary) hypertension: Secondary | ICD-10-CM | POA: Diagnosis not present

## 2016-12-20 DIAGNOSIS — Z952 Presence of prosthetic heart valve: Secondary | ICD-10-CM | POA: Diagnosis not present

## 2016-12-20 DIAGNOSIS — G9340 Encephalopathy, unspecified: Secondary | ICD-10-CM

## 2016-12-20 DIAGNOSIS — I2583 Coronary atherosclerosis due to lipid rich plaque: Secondary | ICD-10-CM | POA: Diagnosis not present

## 2016-12-20 DIAGNOSIS — I480 Paroxysmal atrial fibrillation: Secondary | ICD-10-CM | POA: Diagnosis not present

## 2016-12-20 DIAGNOSIS — I2510 Atherosclerotic heart disease of native coronary artery without angina pectoris: Secondary | ICD-10-CM

## 2016-12-20 DIAGNOSIS — E7849 Other hyperlipidemia: Secondary | ICD-10-CM

## 2016-12-20 NOTE — Progress Notes (Signed)
: Provider:  Noah Delaine. Sheppard Coil, MD Location:  [place] Room Number: 174Y Place of Service:  SNF (31)  PCP: Cari Caraway, MD Patient Care Team: Cari Caraway, MD as PCP - General (Family Medicine) Renella Cunas, MD (Inactive) (Cardiology) Francis Gaines, LCSW as [place] [facility] (Licensed Clinical Social Worker)  Extended Emergency Contact Information Primary Emergency Contact: Oppenheim,Sara Address: [address]          [place] [address] [place] Phone: [phone] Relation: Daughter Secondary Emergency Contact: [place] Phone: [phone] Relation: Niece     Allergies: Codeine; Penicillins; and Vicodin [hydrocodone-acetaminophen]  Chief Complaint  Patient presents with  . New Admit To SNF    Admit to Facility    HPI: Patient is 83 y.o. female wholives alone, ambulates with the help of a cane/walker, PMH of heart block status post PPM, CAD status post CABG, RAS status post stenting, HLD, aVR, breast cancer status post mastectomy, gradual decline over the last 3-4 months, ED visit early March after a fall at home, presented to ED on 12/14/16 with new onset confusion, mumbling, facial droop and EMS noted nonverbal. By the time patient arrived in the ED, speech had normalized, facial droop had resolved but she remained confused. CT head without acute findings. CTA concerning left MCA occlusion-admitting physician discussed with neurology who did not recommend any further evaluation for that. Unable to do MRI due to pacemaker. Admitted to [facility] from 3/20-24 for possible CVA. Work -up c/w only advanced dementia. Pt was deemed not able to care for herself so pt is admitted to SNF with generalized weakness for OT/PT. While at SNF pt will be followed for PAF, tx with coreg, ASA and plavix, HTN, tx with coreg and CAD, tx with ASA, plavix , statin and coreg.   Past Medical  History:  Diagnosis Date  . Aortic stenosis    a. s/p bioprosthetic AVR 2007 [facility]);  b. Echo (04/2009 - [facility] in [place], [place]):  Mild LVH, normal LVF, anterior WMA, AVR ok (mean 4 mmHg), MAC, mild MR, mild TV stenosis, RVSP 44 mmHg  . Arthritis   . Atrial flutter ([place])    post operative  . Breast cancer ([place])    left mastectomy  . CAD (coronary artery disease)    s/p CABG 2007 at [facility]  . Diverticulosis    history  . HLD (hyperlipidemia)   . HTN (hypertension)   . Hx of echocardiogram    Echo (8/15):  Mild focal basal septal hypertrophy, EF 60-65%, no RWMA, AVR ok (mean 8 mmHg), MAC, mild MS (mean 4 mmHg), mod MR, mild LAE, PASP 54 mmHg  . Mobitz II    a. s/p STJ dual chamber PPM 2015  . Paroxysmal atrial fibrillation (HCC)   . Post-menopausal   . PVD (peripheral vascular disease) ([place])     Past Surgical History:  Procedure Laterality Date  . AORTIC VALVE REPLACEMENT  2007   [facility]   . ARTERIOVENOUS GRAFT PLACEMENT W/ ENDOSCOPIC VEIN HARVEST Left    left leg  . BREAST SURGERY    . CORONARY ARTERY BYPASS GRAFT Left 2007   x3, left internal mammary artery to the left anterior descending, saphenous vein graft tot he circumflex coronary  . EYE SURGERY    . HEMORROIDECTOMY    . MASTECTOMY Left 2008   left, breast cancer, followed by a hematology  oncologist  . PERMANENT PACEMAKER INSERTION N/A 05/14/2014   SJM Assurity DR implanted by Dr Rayann Heman for complete heart block  . RENAL ARTERY STENT  2006, 2010  . TONSILLECTOMY  1943    Allergies as of 12/20/2016      Reactions   Codeine Nausea And Vomiting   Penicillins Nausea And Vomiting   Vicodin [hydrocodone-acetaminophen] Nausea And Vomiting      Medication List       Accurate as of 12/20/16  8:54 AM. Always use your most recent med list.          aspirin EC 81 MG tablet Take 81 mg by mouth daily.   B-12 1000 MCG Caps Take 1,000 mcg by mouth daily.   carvedilol 6.25  MG tablet Commonly known as:  COREG Take 1 tablet (6.25 mg total) by mouth daily.   clopidogrel 75 MG tablet Commonly known as:  PLAVIX Take 75 mg by mouth daily.   diphenhydramine-acetaminophen 25-500 MG Tabs tablet Commonly known as:  TYLENOL PM Take 0.5 tablets by mouth at bedtime as needed (sleep).   ondansetron 4 MG disintegrating tablet Commonly known as:  ZOFRAN ODT Take 1 tablet (4 mg total) by mouth every 8 (eight) hours as needed for nausea or vomiting.   rosuvastatin 10 MG tablet Commonly known as:  CRESTOR Take 1 tablet (10 mg total) by mouth daily.   SALONPAS PAIN RELIEF PATCH EX Apply 1 patch topically daily as needed.   senna-docusate 8.6-50 MG tablet Commonly known as:  Senokot-S Take 1 tablet by mouth at bedtime as needed for mild constipation.   SYSTANE OP Place 1 drop into both eyes 2 (two) times daily as needed (dry eyes).   vitamin C 500 MG tablet Commonly known as:  ASCORBIC ACID Take 1,000 mg by mouth daily.   Vitamin D (Ergocalciferol) 50000 units Caps capsule Commonly known as:  DRISDOL Take 1 capsule (50,000 Units total) by mouth every 30 (thirty) days. On or about the 1st of each month   Vitamin D 2000 units tablet Take 2,000 Units by mouth daily.   vitamin E 400 UNIT capsule Take 400 Units by mouth daily. Reported on 12/12/2015       No orders of the defined types were placed in this encounter.   Immunization History  Administered Date(s) Administered  . Tdap 11/23/2015    Social History  Substance Use Topics  . Smoking status: Former Smoker    Types: Cigarettes    Quit date: 09/27/1960  . Smokeless tobacco: Never Used     Comment: quit smoking 56 years ago  . Alcohol use No    Family history is   Family History  Problem Relation Age of Onset  . Colon cancer Sister   . Diabetes    . Heart disease    . Arthritis        Review of Systems  DATA OBTAINED: from patient, nurse GENERAL:  no fevers, fatigue, appetite  changes SKIN: No itching, or rash EYES: No eye pain, redness, discharge EARS: No earache, tinnitus, change in hearing NOSE: No congestion, drainage or bleeding  MOUTH/THROAT: No mouth or tooth pain, No sore throat RESPIRATORY: No cough, wheezing, SOB CARDIAC: No chest pain, palpitations, lower extremity edema  GI: No abdominal pain, No N/V/D or constipation, No heartburn or reflux  GU: No dysuria, frequency or urgency, or incontinence  MUSCULOSKELETAL: No unrelieved bone/joint pain NEUROLOGIC: No headache, dizziness or focal weakness PSYCHIATRIC: No c/o anxiety or sadness  Vitals:   12/20/16 0845  BP: (!) 162/78  Pulse: 72  Resp: 20  Temp: 97 F (36.1 C)    SpO2 Readings from Last 1 Encounters:  12/20/16 95%   Body mass index is 21.87 kg/m.     Physical Exam  GENERAL APPEARANCE: Alert, conversant,  No acute distress.  SKIN: No diaphoresis rash HEAD: Normocephalic, atraumatic  EYES: Conjunctiva/lids clear. Pupils round, reactive. EOMs intact.  EARS: External exam WNL, canals clear. Hearing grossly normal.  NOSE: No deformity or discharge.  MOUTH/THROAT: Lips w/o lesions  RESPIRATORY: Breathing is even, unlabored. Lung sounds are clear   CARDIOVASCULAR: Heart RRR no murmurs, rubs or gallops. No peripheral edema.   GASTROINTESTINAL: Abdomen is soft, non-tender, not distended w/ normal bowel sounds. GENITOURINARY: Bladder non tender, not distended  MUSCULOSKELETAL: No abnormal joints or musculature NEUROLOGIC:  Cranial nerves 2-12 grossly intact. Moves all extremities  PSYCHIATRIC: Mood and affect appropriate to situation with dementia, no behavioral issues  Patient Active Problem List   Diagnosis Date Noted  . Near syncope 12/14/2016  . Acute encephalopathy 12/14/2016  . Elevated troponin 12/14/2016  . Anemia 12/14/2016  . Acute ischemic left MCA stroke ([place])   . Encephalopathy   . CHF (congestive heart failure) ([place]) 12/09/2015  . Risk for falls 12/09/2015  .  History of recent fall 12/09/2015  . Complete heart block ([place]) 05/13/2014  . AV block, 2nd degree 05/13/2014  . Atherosclerosis of renal artery ([place]) 01/04/2012  . Lower extremity edema 11/11/2011  . Coronary artery disease 11/11/2011  . History of aortic valve replacement with bioprosthetic valve 11/11/2011  . History of renal artery stenosis 11/11/2011  . Hypertension 11/11/2011  . Hyperlipidemia 11/11/2011      Labs reviewed: Basic Metabolic Panel:    Component Value Date/Time   NA 136 12/15/2016 0333   NA 136 (A) 12/15/2016   K 3.8 12/15/2016 0333   CL 104 12/15/2016 0333   CO2 23 12/15/2016 0333   GLUCOSE 85 12/15/2016 0333   BUN 11 12/15/2016 0333   BUN 11 12/15/2016   CREATININE 0.83 12/15/2016 0333   CALCIUM 8.5 (L) 12/15/2016 0333   PROT 6.2 (L) 12/14/2016 0932   ALBUMIN 3.5 12/14/2016 0932   AST 23 12/14/2016 0932   ALT 7 (L) 12/14/2016 0932   ALKPHOS 40 12/14/2016 0932   BILITOT 0.9 12/14/2016 0932   GFRNONAA 57 (L) 12/15/2016 0333   GFRAA >60 12/15/2016 0333     Recent Labs  11/30/16 1304  12/14/16 0932 12/14/16 0943 12/14/16 1554 12/15/16 12/15/16 0333  NA 136  < > 136 137  137  --  136* 136  K 3.9  < > 4.3 4.2  4.2  --   --  3.8  CL 103  --  102 102  --   --  104  CO2 24  --  26  --   --   --  23  GLUCOSE 141*  --  111* 107*  --   --  85  BUN 24*  < > _0 --  11 11  CREATININE 0.92  < > 0.93 1.00  1.0 0.81  0.8 0.8 0.83  CALCIUM 8.9  --  8.8*  --   --   --  8.5*  < > = values in this interval not displayed. Liver Function Tests:  Recent Labs  11/30/16 1304 12/14/16 0932  AST 24 23  ALT 10* 7*  ALKPHOS 40 40  BILITOT 0.8 0.9  PROT 6.7 6.2*  ALBUMIN 3.7 3.5    Recent Labs  11/30/16 1304  LIPASE 24   No results for input(s): AMMONIA in the last 8760 hours. CBC:  Recent Labs  11/30/16 1304  12/14/16 0932 12/14/16 0943 12/14/16 1554 12/15/16 12/15/16 0333  WBC 4.5  < > 4.8  --  5.0  5.0 4.4 4.4  NEUTROABS 2.9   --  2.1  --   --   --   --   HGB 11.3*  < > 11.4* 11.2*  11.2* 10.8*  10.8*  --  10.4*  HCT 33.8*  < > 33.9* 33.0*  33* 32.2*  32*  --  31.1*  MCV 97.7  --  98.3  --  96.7  --  97.5  PLT 195  < > 224  --  211  211  --  202  < > = values in this interval not displayed. Lipid  Recent Labs  12/15/16 0333  CHOL 187  HDL 35*  LDLCALC 133*  TRIG 93    Cardiac Enzymes:  Recent Labs  12/14/16 1554 12/14/16 1932 12/15/16 0333  TROPONINI 0.13* 0.13* 0.10*   BNP: No results for input(s): BNP in the last 8760 hours. No results found for: [facility] Lab Results  Component Value Date   HGBA1C 5.2 12/15/2016   Lab Results  Component Value Date   TSH 4.386 12/14/2016   Lab Results  Component Value Date   [id number] 962 12/14/2016   No results found for: FOLATE No results found for: IRON, TIBC, FERRITIN  Imaging and Procedures obtained prior to SNF admission: Ct Angio Head W/cm &/or Wo Cm  Addendum Date: 12/14/2016   ADDENDUM REPORT: 12/14/2016 16:09 ADDENDUM: Study discussed by telephone with Dr. Eulis Foster in the ED on 12/14/2016 at 1604 hours. Electronically Signed   By: Genevie Ann M.D.   On: 12/14/2016 16:09   Result Date: 12/14/2016 CLINICAL DATA:  83 year old female with right facial droop and loss of speech today. Initial encounter. EXAM: CT ANGIOGRAPHY HEAD AND NECK TECHNIQUE: Multidetector CT imaging of the head and neck was performed using the standard protocol during bolus administration of intravenous contrast. Multiplanar CT image reconstructions and MIPs were obtained to evaluate the vascular anatomy. Carotid stenosis measurements (when applicable) are obtained utilizing NASCET criteria, using the distal internal carotid diameter as the denominator. CONTRAST:  50 mL Isovue 370 COMPARISON:  Head CT without contrast 1100 hours today. FINDINGS: CTA NECK Skeleton: Previous median sternotomy. Osteopenia. Degenerative changes. Exaggerated cervical lordosis and thoracic kyphosis  with partially visible mild thoracic scoliosis. Visualized paranasal sinuses and mastoids are stable and well pneumatized. Upper chest: Partially visible left chest cardiac pacemaker or AICD device. Small layering pleural effusions. Mild pulmonary septal thickening. Mild upper lobe mosaic attenuation. No superior mediastinal lymphadenopathy. Other neck: Negative thyroid, larynx, pharynx, parapharyngeal spaces, retropharyngeal space, sublingual space, submandibular glands and parotid glands. No cervical lymphadenopathy. Aortic arch: The entire aortic arch is not included. Three vessel arch configuration suspected. Moderate soft and calcified arch atherosclerosis. Right carotid system: Visible brachiocephalic artery and right CCA origin are without stenosis. Mildly tortuous right CCA. Mild calcified plaque at the right carotid bifurcation with no proximal right ICA stenosis. Tortuous cervical right ICA with a mildly kinked appearance an mildly beaded appearance in the tortuous segment. No beading of the distal cervical right ICA. Left carotid system: Left CCA origin not included. No left CCA stenosis proximal to the bifurcation. Calcified plaque at the left carotid bifurcation extending into the  left ICA origin and bulb, but less than 50 % stenosis with respect to the distal vessel results. Tortuous left ICA distal to the bulb with a kinked appearance at the level of the submandibular gland. No definite beading of the cervical left ICA. Vertebral arteries:No proximal right subclavian artery stenosis despite calcified plaque. Mild plaque at the right vertebral artery origin and V1 segment without stenosis. Mildly tortuous but otherwise negative cervical right vertebral artery. No proximal left subclavian artery stenosis despite abundant calcified plaque. Calcified plaque at the left vertebral artery origin with mild to moderate origin stenosis. Tortuous but otherwise negative cervical left vertebral artery. CTA HEAD  Posterior circulation: Mildly beaded appearance of the distal left V3 and V4 segments. No distal left vertebral artery stenosis. Patent left PICA origin. Moderate calcified atherosclerosis of the right vertebral artery V4 segment with mild to moderate stenosis. The right PICA origin remains normal. The distal right vertebral artery is patent to the vertebrobasilar junction. No basilar stenosis. AICA, SCA, and PCA origins are patent. Fetal type left PCA origin. The right posterior communicating artery is diminutive or absent. Normal bilateral PCA branches. Anterior circulation: Both ICA siphons are patent. There is moderate to severe calcified plaque affecting the siphons, worse on the left. Associated moderate to severe supraclinoid left ICA stenosis. The left ICA terminus remains patent. More moderate appearing right supraclinoid ICA stenosis. The right ICA terminus remains patent. The ophthalmic and left posterior communicating artery origins are normal. Normal MCA and ACA origins. Diminutive or absent anterior communicating artery. Bilateral ACA branches are within normal limits. Right MCA M1 segment, right MCA bifurcation and right MCA branches are within normal limits. Left MCA M1 segment, left MCA bifurcation, and proximal left MCA M2 segments are normal. There is a posterior distal left M3 branch occlusion best demonstrated on series 506, image 32. Other left MCA branches appear within normal limits. Venous sinuses: Patent. Anatomic variants: Fetal type left PCA origin. Delayed phase: No definite acute cortically based infarct is identified. No abnormal enhancement identified. Review of the MIP images confirms the above findings IMPRESSION: 1. Negative for emergent large vessel occlusion but positive for a distal posterior Left MCA distal M3 branch occlusion. 2. Moderate to severe bilateral ICA siphon calcified plaque and siphon stenosis, maximal at the left supraclinoid segment. 3. No hemodynamically  significant carotid stenosis in the neck. Possible cervical right ICA fibromuscular dysplasia (FMD). 4. Up to moderate stenosis of the left vertebral artery origin, and the right vertebral artery V4 segment. Possible distal left vertebral artery FMD. 5. Stable non contrast CT appearance of the brain. No acute cortically based infarct identified at this time. 6. Small layering pleural effusions. Upper lobe septal thickening and mosaic attenuation may represent a combination of mild interstitial edema and gas trapping. Electronically Signed: By: Genevie Ann M.D. On: 12/14/2016 16:01   Dg Chest 2 View  Result Date: 12/14/2016 CLINICAL DATA:  Facial droop and altered mental status. Hypertension. EXAM: CHEST  2 VIEW COMPARISON:  November 30, 2016 FINDINGS: There is mild interstitial edema. No airspace consolidation. Heart is upper normal in size with mild pulmonary venous hypertension. Pacemaker leads are attached to the right atrium and right ventricle. There is calcification in the mitral annulus. Patient has had aortic valve replacement as well as coronary artery bypass grafting. No adenopathy. Bones are osteoporotic. There is degenerative change in each shoulder with chronic rotator cuff tears bilaterally. There is aortic atherosclerosis. IMPRESSION: Findings felt to represent a degree of congestive heart  failure, mild by radiography. No airspace consolidation. Aortic atherosclerosis. Electronically Signed   By: Lowella Grip III M.D.   On: 12/14/2016 15:01   Ct Head Wo Contrast  Result Date: 12/14/2016 CLINICAL DATA:  Resolved stroke symptoms EXAM: CT HEAD WITHOUT CONTRAST TECHNIQUE: Contiguous axial images were obtained from the base of the skull through the vertex without intravenous contrast. COMPARISON:  None. FINDINGS: Brain: No evidence of acute infarction, hemorrhage, hydrocephalus, extra-axial collection or mass lesion/mass effect. Global cortical atrophy. Right basal ganglia lacunar infarct. Subcortical  white matter and periventricular small vessel ischemic changes. Vascular: Intracranial atherosclerosis. Skull: Normal. Negative for fracture or focal lesion. Sinuses/Orbits: The visualized paranasal sinuses are essentially clear. The mastoid air cells are unopacified. Other: None. IMPRESSION: No evidence of acute intracranial abnormality. Atrophy with small vessel ischemic changes. Old right basal ganglia lacunar infarct. Electronically Signed   By: Julian Hy M.D.   On: 12/14/2016 11:07   Ct Angio Neck W/cm &/or Wo/cm  Addendum Date: 12/14/2016   ADDENDUM REPORT: 12/14/2016 16:09 ADDENDUM: Study discussed by telephone with Dr. Eulis Foster in the ED on 12/14/2016 at 1604 hours. Electronically Signed   By: Genevie Ann M.D.   On: 12/14/2016 16:09   Result Date: 12/14/2016 CLINICAL DATA:  83 year old female with right facial droop and loss of speech today. Initial encounter. EXAM: CT ANGIOGRAPHY HEAD AND NECK TECHNIQUE: Multidetector CT imaging of the head and neck was performed using the standard protocol during bolus administration of intravenous contrast. Multiplanar CT image reconstructions and MIPs were obtained to evaluate the vascular anatomy. Carotid stenosis measurements (when applicable) are obtained utilizing NASCET criteria, using the distal internal carotid diameter as the denominator. CONTRAST:  50 mL Isovue 370 COMPARISON:  Head CT without contrast 1100 hours today. FINDINGS: CTA NECK Skeleton: Previous median sternotomy. Osteopenia. Degenerative changes. Exaggerated cervical lordosis and thoracic kyphosis with partially visible mild thoracic scoliosis. Visualized paranasal sinuses and mastoids are stable and well pneumatized. Upper chest: Partially visible left chest cardiac pacemaker or AICD device. Small layering pleural effusions. Mild pulmonary septal thickening. Mild upper lobe mosaic attenuation. No superior mediastinal lymphadenopathy. Other neck: Negative thyroid, larynx, pharynx,  parapharyngeal spaces, retropharyngeal space, sublingual space, submandibular glands and parotid glands. No cervical lymphadenopathy. Aortic arch: The entire aortic arch is not included. Three vessel arch configuration suspected. Moderate soft and calcified arch atherosclerosis. Right carotid system: Visible brachiocephalic artery and right CCA origin are without stenosis. Mildly tortuous right CCA. Mild calcified plaque at the right carotid bifurcation with no proximal right ICA stenosis. Tortuous cervical right ICA with a mildly kinked appearance an mildly beaded appearance in the tortuous segment. No beading of the distal cervical right ICA. Left carotid system: Left CCA origin not included. No left CCA stenosis proximal to the bifurcation. Calcified plaque at the left carotid bifurcation extending into the left ICA origin and bulb, but less than 50 % stenosis with respect to the distal vessel results. Tortuous left ICA distal to the bulb with a kinked appearance at the level of the submandibular gland. No definite beading of the cervical left ICA. Vertebral arteries:No proximal right subclavian artery stenosis despite calcified plaque. Mild plaque at the right vertebral artery origin and V1 segment without stenosis. Mildly tortuous but otherwise negative cervical right vertebral artery. No proximal left subclavian artery stenosis despite abundant calcified plaque. Calcified plaque at the left vertebral artery origin with mild to moderate origin stenosis. Tortuous but otherwise negative cervical left vertebral artery. CTA HEAD Posterior circulation: Mildly beaded appearance  of the distal left V3 and V4 segments. No distal left vertebral artery stenosis. Patent left PICA origin. Moderate calcified atherosclerosis of the right vertebral artery V4 segment with mild to moderate stenosis. The right PICA origin remains normal. The distal right vertebral artery is patent to the vertebrobasilar junction. No basilar  stenosis. AICA, SCA, and PCA origins are patent. Fetal type left PCA origin. The right posterior communicating artery is diminutive or absent. Normal bilateral PCA branches. Anterior circulation: Both ICA siphons are patent. There is moderate to severe calcified plaque affecting the siphons, worse on the left. Associated moderate to severe supraclinoid left ICA stenosis. The left ICA terminus remains patent. More moderate appearing right supraclinoid ICA stenosis. The right ICA terminus remains patent. The ophthalmic and left posterior communicating artery origins are normal. Normal MCA and ACA origins. Diminutive or absent anterior communicating artery. Bilateral ACA branches are within normal limits. Right MCA M1 segment, right MCA bifurcation and right MCA branches are within normal limits. Left MCA M1 segment, left MCA bifurcation, and proximal left MCA M2 segments are normal. There is a posterior distal left M3 branch occlusion best demonstrated on series 506, image 32. Other left MCA branches appear within normal limits. Venous sinuses: Patent. Anatomic variants: Fetal type left PCA origin. Delayed phase: No definite acute cortically based infarct is identified. No abnormal enhancement identified. Review of the MIP images confirms the above findings IMPRESSION: 1. Negative for emergent large vessel occlusion but positive for a distal posterior Left MCA distal M3 branch occlusion. 2. Moderate to severe bilateral ICA siphon calcified plaque and siphon stenosis, maximal at the left supraclinoid segment. 3. No hemodynamically significant carotid stenosis in the neck. Possible cervical right ICA fibromuscular dysplasia (FMD). 4. Up to moderate stenosis of the left vertebral artery origin, and the right vertebral artery V4 segment. Possible distal left vertebral artery FMD. 5. Stable non contrast CT appearance of the brain. No acute cortically based infarct identified at this time. 6. Small layering pleural  effusions. Upper lobe septal thickening and mosaic attenuation may represent a combination of mild interstitial edema and gas trapping. Electronically Signed: By: Genevie Ann M.D. On: 12/14/2016 16:01   Dg Shoulder Left  Result Date: 12/15/2016 CLINICAL DATA:  Left shoulder pain. EXAM: LEFT SHOULDER - 2+ VIEW COMPARISON:  None. FINDINGS: The bones are diffusely osteopenic. There is a high-riding left humeral head with remodeling of the undersurface of the left acromion and sub chondral cystic change in the superior aspect of the left humeral head. A amorphous soft tissue calcification between the left acromion and left humeral head is again noted. Mild degenerative changes of the glenohumeral joint. No acute fracture or subluxation. No dislocation. IMPRESSION: 1. 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 Electronically Signed   By: Kerby Moors M.D.   On: 12/15/2016 18:33     Not all labs, radiology exams or other studies done during hospitalization come through on my EPIC note; however they are reviewed by me.    Assessment and Plan  ACUTE ENCEPHALOPATHY-.Unclear etiology - No significant metabolic derangements or clinical infectious etiology. No new medications per family. - TSH and B12 unremarkable. - Mental status had improved but again worsened overnight 3/21. Based on history, likely has dementia and sundowning and hospitalization likely contributing. - Urine microscopy: Negative for UTI features. - Mental status has improved & back to baseline. SNF - admitted for genralized weakness for OT/PT  POSSIBLE CVA-CT head without acute findings - CTA head and neck: No emergent large vessel occlusion. 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 No hemodynamically significant carotid stenosis  in the neck moderate stenosis of left vertebral artery origin no acute cortically based infarct. - Unable to do MRI due to pacemaker. - 2-D echo: LVEF 60-65 percent. - Hemoglobin A1c: 5.2 - LDL 133 - Continue aspirin 81 MG daily and Plavix. Would benefit from anticoagulation given history of PAF but history of falls and hence continuing off of anticoagulation.Discussed with stroke M.D. on call on 3/23 who recommended no change in antiplatelet therapy. - therapies recommend SNF. SNF - ADMITTED FOR ot/pt; cont ASA 81 mg and plavix 75 mg daily, already using for PAF  HLD - LDL 133 SNF - goal <70, will cont crestor 10 mg , was inc in hospital  PAF SNF - stable; conr coreg 6.25 mg daily, and ASA 81 mg and plavix 75 mg; not candidate for anti coag 2/2 age and falls  HTN SNF - controlled; cont coreg 6.25 mg daily  CAD - s/p CABG SNF - cont ASA 81 mg, plavix 75 mg daily, crestor 10 mg daily and coerg 6.25 mg daily  S/P TISSUE AVR  [facility] SNF - advanced age, dementia; supportive care  [facility] SNF - d/c Hb 10.4; will f/u CBC    Time spent > 45 min;> 50% of time with patient was spent reviewing records, labs, tests and studies, counseling and developing plan of care  Webb Silversmith D. Sheppard Coil, MD

## 2016-12-22 ENCOUNTER — Other Ambulatory Visit: Payer: Self-pay | Admitting: *Deleted

## 2016-12-23 ENCOUNTER — Encounter: Payer: Self-pay | Admitting: Internal Medicine

## 2016-12-23 DIAGNOSIS — R627 Adult failure to thrive: Secondary | ICD-10-CM | POA: Insufficient documentation

## 2016-12-23 DIAGNOSIS — Z952 Presence of prosthetic heart valve: Secondary | ICD-10-CM | POA: Insufficient documentation

## 2016-12-23 DIAGNOSIS — I4891 Unspecified atrial fibrillation: Secondary | ICD-10-CM | POA: Insufficient documentation

## 2016-12-23 NOTE — Patient Outreach (Signed)
[facility]) Care Management  12/23/2016  NIKOLETTE REINDL [birth year] [id number]   CSW was able to meet with patient today at [facility] [place] where patient currently resides to receive short-term rehabilitative services.  CSW introduced self, explained role and types of services provided through [facility].  CSW further explained to patient that CSW has been corresponding with patient's daughter, [name] with regards to arranging additional home care services for patient.  Patient admitted that she was aware that Mrs. [name] was working on this, reporting that she believes the arrangements have already been made.  Patient further reported that she currently lives alone and will definitely need help when she is released from [facility]. CSW will make contact with [name] to ensure that all discharge planning arrangements are in place prior to patient returning home.  CSW will assist patient and [name] with arranging home care services and durable medical equipment, if recommended by therapists (both physical and occupational) at the skilled facility.  CSW will also converse with the social worker on staff at [facility] to coordinate patient's care.  CSW will make arrangements to follow-up with patient in two weeks to assess and assist with discharge planning needs and services. Nat Christen, BSW, MSW, LCSW  Licensed Education officer, [facility]  Mailing [place] [address], [place], [place] [address] Physical Address-[address], [place], [place] [address] Toll Free Main # [phone] Fax # [phone] Cell # [phone]  Office # [phone] [email or web address]

## 2016-12-24 LAB — CBC AND DIFFERENTIAL
HEMATOCRIT: 33 % — AB (ref 36–46)
Hemoglobin: 11.3 g/dL — AB (ref 12.0–16.0)
PLATELETS: 212 10*3/uL (ref 150–399)
WBC: 4.6 10^3/mL

## 2016-12-24 LAB — BASIC METABOLIC PANEL
BUN: 20 mg/dL (ref 4–21)
Creatinine: 0.7 mg/dL (ref 0.5–1.1)
Glucose: 87 mg/dL
Potassium: 4.2 mmol/L (ref 3.4–5.3)
Sodium: 135 mmol/L — AB (ref 137–147)

## 2017-01-04 ENCOUNTER — Encounter: Payer: Self-pay | Admitting: Internal Medicine

## 2017-01-04 ENCOUNTER — Non-Acute Institutional Stay (SKILLED_NURSING_FACILITY): Payer: Medicare Other | Admitting: Internal Medicine

## 2017-01-04 DIAGNOSIS — E784 Other hyperlipidemia: Secondary | ICD-10-CM

## 2017-01-04 DIAGNOSIS — I2510 Atherosclerotic heart disease of native coronary artery without angina pectoris: Secondary | ICD-10-CM

## 2017-01-04 DIAGNOSIS — E7849 Other hyperlipidemia: Secondary | ICD-10-CM

## 2017-01-04 DIAGNOSIS — R627 Adult failure to thrive: Secondary | ICD-10-CM

## 2017-01-04 DIAGNOSIS — G9340 Encephalopathy, unspecified: Secondary | ICD-10-CM | POA: Diagnosis not present

## 2017-01-04 DIAGNOSIS — I480 Paroxysmal atrial fibrillation: Secondary | ICD-10-CM | POA: Diagnosis not present

## 2017-01-04 DIAGNOSIS — I2583 Coronary atherosclerosis due to lipid rich plaque: Secondary | ICD-10-CM | POA: Diagnosis not present

## 2017-01-04 DIAGNOSIS — Z953 Presence of xenogenic heart valve: Secondary | ICD-10-CM

## 2017-01-04 DIAGNOSIS — D649 Anemia, unspecified: Secondary | ICD-10-CM | POA: Diagnosis not present

## 2017-01-04 DIAGNOSIS — I63512 Cerebral infarction due to unspecified occlusion or stenosis of left middle cerebral artery: Secondary | ICD-10-CM

## 2017-01-04 NOTE — Progress Notes (Signed)
Location:  Hall of Service:   SNF  Stephanie Duos MD  PCP: Stephanie Caraway, MD Patient Care Team: Stephanie Caraway, MD as PCP - General (Family Medicine) Stephanie Cunas, MD (Inactive) (Cardiology) Stephanie Gaines, LCSW as [employer] (Licensed Clinical Social Worker)  Extended Emergency Contact Information Primary Emergency Contact: Stephanie Cochran,Stephanie Cochran Address: [address]          [place] [address] [place] Phone: [phone] Relation: Daughter Secondary Emergency Contact: Stephanie Cochran [place] Phone: [phone] Relation: Niece  Allergies  Allergen Reactions  . Codeine Nausea And Vomiting  . Penicillins Nausea And Vomiting  . Vicodin [Hydrocodone-Acetaminophen] Nausea And Vomiting    Chief Complaint  Patient presents with  . Discharge Note    HPI:  83 y.o. female  Who lives alone, ambulates with the help of a cane/walker, PMH of heart block status post PPM, CAD status post CABG, RAS status post stenting, HLD, aVR, breast cancer status post mastectomy, gradual decline over the last 3-4 months, ED visit early March after a fall at home, presented to ED on 12/14/16 with new onset confusion, mumbling, facial droop and EMS noted nonverbal. By the time patient arrived in the ED, speech had normalized, facial droop had resolved but she remained confused. CT head without acute findings. CTA concerning left MCA occlusion-admitting physician discussed with neurology who did not recommend any further evaluation for that. Unable to do MRI due to pacemaker. Admitted to [facility] from 3/20-24 for possible CVA. Work -up c/w only advanced dementia. Pt was deemed not able to care for herself so pt was admitted to SNF with generalized weakness for OT/PT and is now ready to be d/c to home.    Past Medical History:  Diagnosis Date  . Aortic stenosis    a. s/p bioprosthetic AVR 2007  [facility]);  b. Echo (04/2009 - [facility] in [place], [place]):  Mild LVH, normal LVF, anterior WMA, AVR ok (mean 4 mmHg), MAC, mild MR, mild TV stenosis, RVSP 44 mmHg  . Arthritis   . Atrial flutter ([facility])    post operative  . Breast cancer ([place])    left mastectomy  . CAD (coronary artery disease)    s/p CABG 2007 at [facility]  . Diverticulosis    history  . HLD (hyperlipidemia)   . HTN (hypertension)   . Hx of echocardiogram    Echo (8/15):  Mild focal basal septal hypertrophy, EF 60-65%, no RWMA, AVR ok (mean 8 mmHg), MAC, mild MS (mean 4 mmHg), mod MR, mild LAE, PASP 54 mmHg  . Mobitz II    a. s/p STJ dual chamber PPM 2015  . Paroxysmal atrial fibrillation (HCC)   . Post-menopausal   . PVD (peripheral vascular disease) ([place])     Past Surgical History:  Procedure Laterality Date  . AORTIC VALVE REPLACEMENT  2007   [facility]   . ARTERIOVENOUS GRAFT PLACEMENT W/ ENDOSCOPIC VEIN HARVEST Left    left leg  . BREAST SURGERY    . CORONARY ARTERY BYPASS GRAFT Left 2007   x3, left internal mammary artery to the left anterior descending, saphenous vein graft tot he circumflex coronary  . EYE SURGERY    . HEMORROIDECTOMY    . MASTECTOMY Left 2008   left, breast cancer, followed by a hematology oncologist  [place]   SJM Assurity DR implanted  by Dr Rayann Heman for complete heart block  . RENAL ARTERY STENT  2006, 2010  . TONSILLECTOMY  1943     reports that she quit smoking about 56 years ago. Her smoking use included Cigarettes. She has never used smokeless tobacco. She reports that she does not drink alcohol or use drugs. Social History   Social History  . Marital status: Widowed    Spouse name: N/A  . Number of children: N/A  . Years of education: N/A   Occupational History  . Not on file.   Social History Main Topics  . Smoking status: Former Smoker    Types: Cigarettes    Quit date: 09/27/1960  . Smokeless  tobacco: Never Used     Comment: quit smoking 56 years ago  . Alcohol use No  . Drug use: No  . Sexual activity: Not on file   Other Topics Concern  . Not on file   Social History Narrative  . No narrative on file    Pertinent  Health Maintenance Due  Topic Date Due  . DEXA SCAN  10/06/1984  . PNA vac Low Risk Adult (1 of 2 - PCV13) 10/06/1984  . INFLUENZA VACCINE  04/27/2017    Medications: Allergies as of 01/04/2017      Reactions   Codeine Nausea And Vomiting   Penicillins Nausea And Vomiting   Vicodin [hydrocodone-acetaminophen] Nausea And Vomiting      Medication List       Accurate as of 01/04/17 12:21 PM. Always use your most recent med list.          aspirin EC 81 MG tablet Take 81 mg by mouth daily.   B-12 1000 MCG Caps Take 1,000 mcg by mouth daily.   carvedilol 6.25 MG tablet Commonly known as:  COREG Take 1 tablet (6.25 mg total) by mouth daily.   clopidogrel 75 MG tablet Commonly known as:  PLAVIX Take 75 mg by mouth daily.   diphenhydramine-acetaminophen 25-500 MG Tabs tablet Commonly known as:  TYLENOL PM Take 0.5 tablets by mouth at bedtime as needed (sleep).   ENSURE ENLIVE PO Take 237 mLs by mouth 2 (two) times daily.   ondansetron 4 MG disintegrating tablet Commonly known as:  ZOFRAN ODT Take 1 tablet (4 mg total) by mouth every 8 (eight) hours as needed for nausea or vomiting.   rosuvastatin 10 MG tablet Commonly known as:  CRESTOR Take 1 tablet (10 mg total) by mouth daily.   SALONPAS PAIN RELIEF PATCH EX Apply 1 patch topically daily as needed.   senna-docusate 8.6-50 MG tablet Commonly known as:  Senokot-S Take 1 tablet by mouth at bedtime as needed for mild constipation.   SYSTANE OP Place 1 drop into both eyes 2 (two) times daily as needed (dry eyes).   vitamin C 500 MG tablet Commonly known as:  ASCORBIC ACID Take 1,000 mg by mouth daily.   Vitamin D (Ergocalciferol) 50000 units Caps capsule Commonly known as:   DRISDOL Take 1 capsule (50,000 Units total) by mouth every 30 (thirty) days. On or about the 1st of each month   Vitamin D 2000 units tablet Take 2,000 Units by mouth daily.   vitamin E 400 UNIT capsule Take 400 Units by mouth daily. Reported on 12/12/2015        Vitals:   01/04/17 1219  BP: 134/62  Pulse: 68  Resp: 18  Temp: 97 F (36.1 C)  SpO2: 94%   There is no height or weight  on file to calculate BMI.  Physical Exam  GENERAL APPEARANCE: Alert, conversant. No acute distress.  HEENT: Unremarkable. RESPIRATORY: Breathing is even, unlabored. Lung sounds are clear   CARDIOVASCULAR: Heart RRR no murmurs, rubs or gallops. No peripheral edema.  GASTROINTESTINAL: Abdomen is soft, non-tender, not distended w/ normal bowel sounds.  NEUROLOGIC: Cranial nerves 2-12 grossly intact. Moves all extremities   Labs reviewed: Basic Metabolic Panel:  Recent Labs  11/30/16 1304  12/14/16 0932 12/14/16 0943 12/14/16 1554 12/15/16 12/15/16 0333  NA 136  < > 136 137  137  --  136* 136  K 3.9  < > 4.3 4.2  4.2  --   --  3.8  CL 103  --  102 102  --   --  104  CO2 24  --  26  --   --   --  23  GLUCOSE 141*  --  111* 107*  --   --  85  BUN 24*  < > _0 --  11 11  CREATININE 0.92  < > 0.93 1.00  1.0 0.81  0.8 0.8 0.83  CALCIUM 8.9  --  8.8*  --   --   --  8.5*  < > = values in this interval not displayed. No results found for: [facility] Liver Function Tests:  Recent Labs  11/30/16 1304 12/14/16 0932  AST 24 23  ALT 10* 7*  ALKPHOS 40 40  BILITOT 0.8 0.9  PROT 6.7 6.2*  ALBUMIN 3.7 3.5    Recent Labs  11/30/16 1304  LIPASE 24   No results for input(s): AMMONIA in the last 8760 hours. CBC:  Recent Labs  11/30/16 1304  12/14/16 0932 12/14/16 0943 12/14/16 1554 12/15/16 12/15/16 0333  WBC 4.5  < > 4.8  --  5.0  5.0 4.4 4.4  NEUTROABS 2.9  --  2.1  --   --   --   --   HGB 11.3*  < > 11.4* 11.2*  11.2* 10.8*  10.8*  --  10.4*  HCT 33.8*  < >  33.9* 33.0*  33* 32.2*  32*  --  31.1*  MCV 97.7  --  98.3  --  96.7  --  97.5  PLT 195  < > 224  --  211  211  --  202  < > = values in this interval not displayed. Lipid  Recent Labs  12/15/16 0333  CHOL 187  HDL 35*  LDLCALC 133*  TRIG 93   Cardiac Enzymes:  Recent Labs  12/14/16 1554 12/14/16 1932 12/15/16 0333  TROPONINI 0.13* 0.13* 0.10*   BNP: No results for input(s): BNP in the last 8760 hours. CBG:  Recent Labs  12/14/16 0933  GLUCAP 95    Procedures and Imaging Studies During Stay: Ct Angio Head W/cm &/or Wo Cm  Addendum Date: 12/14/2016   ADDENDUM REPORT: 12/14/2016 16:09 ADDENDUM: Study discussed by telephone with Dr. Eulis Foster in the ED on 12/14/2016 at 1604 hours. Electronically Signed   By: Genevie Ann M.D.   On: 12/14/2016 16:09   Result Date: 12/14/2016 CLINICAL DATA:  83 year old female with right facial droop and loss of speech today. Initial encounter. EXAM: CT ANGIOGRAPHY HEAD AND NECK TECHNIQUE: Multidetector CT imaging of the head and neck was performed using the standard protocol during bolus administration of intravenous contrast. Multiplanar CT image reconstructions and MIPs were obtained to evaluate the vascular anatomy. Carotid stenosis measurements (when applicable) are obtained utilizing NASCET  criteria, using the distal internal carotid diameter as the denominator. CONTRAST:  50 mL Isovue 370 COMPARISON:  Head CT without contrast 1100 hours today. FINDINGS: CTA NECK Skeleton: Previous median sternotomy. Osteopenia. Degenerative changes. Exaggerated cervical lordosis and thoracic kyphosis with partially visible mild thoracic scoliosis. Visualized paranasal sinuses and mastoids are stable and well pneumatized. Upper chest: Partially visible left chest cardiac pacemaker or AICD device. Small layering pleural effusions. Mild pulmonary septal thickening. Mild upper lobe mosaic attenuation. No superior mediastinal lymphadenopathy. Other neck: Negative thyroid,  larynx, pharynx, parapharyngeal spaces, retropharyngeal space, sublingual space, submandibular glands and parotid glands. No cervical lymphadenopathy. Aortic arch: The entire aortic arch is not included. Three vessel arch configuration suspected. Moderate soft and calcified arch atherosclerosis. Right carotid system: Visible brachiocephalic artery and right CCA origin are without stenosis. Mildly tortuous right CCA. Mild calcified plaque at the right carotid bifurcation with no proximal right ICA stenosis. Tortuous cervical right ICA with a mildly kinked appearance an mildly beaded appearance in the tortuous segment. No beading of the distal cervical right ICA. Left carotid system: Left CCA origin not included. No left CCA stenosis proximal to the bifurcation. Calcified plaque at the left carotid bifurcation extending into the left ICA origin and bulb, but less than 50 % stenosis with respect to the distal vessel results. Tortuous left ICA distal to the bulb with a kinked appearance at the level of the submandibular gland. No definite beading of the cervical left ICA. Vertebral arteries:No proximal right subclavian artery stenosis despite calcified plaque. Mild plaque at the right vertebral artery origin and V1 segment without stenosis. Mildly tortuous but otherwise negative cervical right vertebral artery. No proximal left subclavian artery stenosis despite abundant calcified plaque. Calcified plaque at the left vertebral artery origin with mild to moderate origin stenosis. Tortuous but otherwise negative cervical left vertebral artery. CTA HEAD Posterior circulation: Mildly beaded appearance of the distal left V3 and V4 segments. No distal left vertebral artery stenosis. Patent left PICA origin. Moderate calcified atherosclerosis of the right vertebral artery V4 segment with mild to moderate stenosis. The right PICA origin remains normal. The distal right vertebral artery is patent to the vertebrobasilar junction.  No basilar stenosis. AICA, SCA, and PCA origins are patent. Fetal type left PCA origin. The right posterior communicating artery is diminutive or absent. Normal bilateral PCA branches. Anterior circulation: Both ICA siphons are patent. There is moderate to severe calcified plaque affecting the siphons, worse on the left. Associated moderate to severe supraclinoid left ICA stenosis. The left ICA terminus remains patent. More moderate appearing right supraclinoid ICA stenosis. The right ICA terminus remains patent. The ophthalmic and left posterior communicating artery origins are normal. Normal MCA and ACA origins. Diminutive or absent anterior communicating artery. Bilateral ACA branches are within normal limits. Right MCA M1 segment, right MCA bifurcation and right MCA branches are within normal limits. Left MCA M1 segment, left MCA bifurcation, and proximal left MCA M2 segments are normal. There is a posterior distal left M3 branch occlusion best demonstrated on series 506, image 32. Other left MCA branches appear within normal limits. Venous sinuses: Patent. Anatomic variants: Fetal type left PCA origin. Delayed phase: No definite acute cortically based infarct is identified. No abnormal enhancement identified. Review of the MIP images confirms the above findings IMPRESSION: 1. Negative for emergent large vessel occlusion but positive for a distal posterior Left MCA distal M3 branch occlusion. 2. Moderate to severe bilateral ICA siphon calcified plaque and siphon stenosis, maximal at the  left supraclinoid segment. 3. No hemodynamically significant carotid stenosis in the neck. Possible cervical right ICA fibromuscular dysplasia (FMD). 4. Up to moderate stenosis of the left vertebral artery origin, and the right vertebral artery V4 segment. Possible distal left vertebral artery FMD. 5. Stable non contrast CT appearance of the brain. No acute cortically based infarct identified at this time. 6. Small layering  pleural effusions. Upper lobe septal thickening and mosaic attenuation may represent a combination of mild interstitial edema and gas trapping. Electronically Signed: By: Genevie Ann M.D. On: 12/14/2016 16:01   Dg Chest 2 View  Result Date: 12/14/2016 CLINICAL DATA:  Facial droop and altered mental status. Hypertension. EXAM: CHEST  2 VIEW COMPARISON:  November 30, 2016 FINDINGS: There is mild interstitial edema. No airspace consolidation. Heart is upper normal in size with mild pulmonary venous hypertension. Pacemaker leads are attached to the right atrium and right ventricle. There is calcification in the mitral annulus. Patient has had aortic valve replacement as well as coronary artery bypass grafting. No adenopathy. Bones are osteoporotic. There is degenerative change in each shoulder with chronic rotator cuff tears bilaterally. There is aortic atherosclerosis. IMPRESSION: Findings felt to represent a degree of congestive heart failure, mild by radiography. No airspace consolidation. Aortic atherosclerosis. Electronically Signed   By: Lowella Grip III M.D.   On: 12/14/2016 15:01   Ct Head Wo Contrast  Result Date: 12/14/2016 CLINICAL DATA:  Resolved stroke symptoms EXAM: CT HEAD WITHOUT CONTRAST TECHNIQUE: Contiguous axial images were obtained from the base of the skull through the vertex without intravenous contrast. COMPARISON:  None. FINDINGS: Brain: No evidence of acute infarction, hemorrhage, hydrocephalus, extra-axial collection or mass lesion/mass effect. Global cortical atrophy. Right basal ganglia lacunar infarct. Subcortical white matter and periventricular small vessel ischemic changes. Vascular: Intracranial atherosclerosis. Skull: Normal. Negative for fracture or focal lesion. Sinuses/Orbits: The visualized paranasal sinuses are essentially clear. The mastoid air cells are unopacified. Other: None. IMPRESSION: No evidence of acute intracranial abnormality. Atrophy with small vessel ischemic  changes. Old right basal ganglia lacunar infarct. Electronically Signed   By: Julian Hy M.D.   On: 12/14/2016 11:07   Ct Angio Neck W/cm &/or Wo/cm  Addendum Date: 12/14/2016   ADDENDUM REPORT: 12/14/2016 16:09 ADDENDUM: Study discussed by telephone with Dr. Eulis Foster in the ED on 12/14/2016 at 1604 hours. Electronically Signed   By: Genevie Ann M.D.   On: 12/14/2016 16:09   Result Date: 12/14/2016 CLINICAL DATA:  83 year old female with right facial droop and loss of speech today. Initial encounter. EXAM: CT ANGIOGRAPHY HEAD AND NECK TECHNIQUE: Multidetector CT imaging of the head and neck was performed using the standard protocol during bolus administration of intravenous contrast. Multiplanar CT image reconstructions and MIPs were obtained to evaluate the vascular anatomy. Carotid stenosis measurements (when applicable) are obtained utilizing NASCET criteria, using the distal internal carotid diameter as the denominator. CONTRAST:  50 mL Isovue 370 COMPARISON:  Head CT without contrast 1100 hours today. FINDINGS: CTA NECK Skeleton: Previous median sternotomy. Osteopenia. Degenerative changes. Exaggerated cervical lordosis and thoracic kyphosis with partially visible mild thoracic scoliosis. Visualized paranasal sinuses and mastoids are stable and well pneumatized. Upper chest: Partially visible left chest cardiac pacemaker or AICD device. Small layering pleural effusions. Mild pulmonary septal thickening. Mild upper lobe mosaic attenuation. No superior mediastinal lymphadenopathy. Other neck: Negative thyroid, larynx, pharynx, parapharyngeal spaces, retropharyngeal space, sublingual space, submandibular glands and parotid glands. No cervical lymphadenopathy. Aortic arch: The entire aortic arch is not included. Three  vessel arch configuration suspected. Moderate soft and calcified arch atherosclerosis. Right carotid system: Visible brachiocephalic artery and right CCA origin are without stenosis. Mildly  tortuous right CCA. Mild calcified plaque at the right carotid bifurcation with no proximal right ICA stenosis. Tortuous cervical right ICA with a mildly kinked appearance an mildly beaded appearance in the tortuous segment. No beading of the distal cervical right ICA. Left carotid system: Left CCA origin not included. No left CCA stenosis proximal to the bifurcation. Calcified plaque at the left carotid bifurcation extending into the left ICA origin and bulb, but less than 50 % stenosis with respect to the distal vessel results. Tortuous left ICA distal to the bulb with a kinked appearance at the level of the submandibular gland. No definite beading of the cervical left ICA. Vertebral arteries:No proximal right subclavian artery stenosis despite calcified plaque. Mild plaque at the right vertebral artery origin and V1 segment without stenosis. Mildly tortuous but otherwise negative cervical right vertebral artery. No proximal left subclavian artery stenosis despite abundant calcified plaque. Calcified plaque at the left vertebral artery origin with mild to moderate origin stenosis. Tortuous but otherwise negative cervical left vertebral artery. CTA HEAD Posterior circulation: Mildly beaded appearance of the distal left V3 and V4 segments. No distal left vertebral artery stenosis. Patent left PICA origin. Moderate calcified atherosclerosis of the right vertebral artery V4 segment with mild to moderate stenosis. The right PICA origin remains normal. The distal right vertebral artery is patent to the vertebrobasilar junction. No basilar stenosis. AICA, SCA, and PCA origins are patent. Fetal type left PCA origin. The right posterior communicating artery is diminutive or absent. Normal bilateral PCA branches. Anterior circulation: Both ICA siphons are patent. There is moderate to severe calcified plaque affecting the siphons, worse on the left. Associated moderate to severe supraclinoid left ICA stenosis. The left ICA  terminus remains patent. More moderate appearing right supraclinoid ICA stenosis. The right ICA terminus remains patent. The ophthalmic and left posterior communicating artery origins are normal. Normal MCA and ACA origins. Diminutive or absent anterior communicating artery. Bilateral ACA branches are within normal limits. Right MCA M1 segment, right MCA bifurcation and right MCA branches are within normal limits. Left MCA M1 segment, left MCA bifurcation, and proximal left MCA M2 segments are normal. There is a posterior distal left M3 branch occlusion best demonstrated on series 506, image 32. Other left MCA branches appear within normal limits. Venous sinuses: Patent. Anatomic variants: Fetal type left PCA origin. Delayed phase: No definite acute cortically based infarct is identified. No abnormal enhancement identified. Review of the MIP images confirms the above findings IMPRESSION: 1. Negative for emergent large vessel occlusion but positive for a distal posterior Left MCA distal M3 branch occlusion. 2. Moderate to severe bilateral ICA siphon calcified plaque and siphon stenosis, maximal at the left supraclinoid segment. 3. No hemodynamically significant carotid stenosis in the neck. Possible cervical right ICA fibromuscular dysplasia (FMD). 4. Up to moderate stenosis of the left vertebral artery origin, and the right vertebral artery V4 segment. Possible distal left vertebral artery FMD. 5. Stable non contrast CT appearance of the brain. No acute cortically based infarct identified at this time. 6. Small layering pleural effusions. Upper lobe septal thickening and mosaic attenuation may represent a combination of mild interstitial edema and gas trapping. Electronically Signed: By: Genevie Ann M.D. On: 12/14/2016 16:01   Dg Shoulder Left  Result Date: 12/15/2016 CLINICAL DATA:  Left shoulder pain. EXAM: LEFT SHOULDER - 2+ VIEW  COMPARISON:  None. FINDINGS: The bones are diffusely osteopenic. There is a  high-riding left humeral head with remodeling of the undersurface of the left acromion and sub chondral cystic change in the superior aspect of the left humeral head. A amorphous soft tissue calcification between the left acromion and left humeral head is again noted. Mild degenerative changes of the glenohumeral joint. No acute fracture or subluxation. No dislocation. IMPRESSION: 1. 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 Electronically Signed   By: Kerby Moors M.D.   On: 12/15/2016 18:33    Assessment/Plan:   No diagnosis found.   Patient is being discharged with the following home health services:  OT/PT/Nursing  Patient is being discharged with the following durable medical equipment: standard WC   Patient has been advised to f/u with their PCP in 1-2 weeks to bring them up to date on their rehab stay.  Social services at facility was responsible for arranging this appointment.  Pt was provided with a 30 day supply of prescriptions for medications and refills must be obtained from their PCP.  For controlled substances, a more limited supply may be provided adequate until PCP appointment only.    Time spent > 30 min;> 50% of time with patient was spent reviewing records, labs, tests and studies, counseling and developing plan of care  Inocencio Homes, MD

## 2017-01-05 ENCOUNTER — Ambulatory Visit: Payer: Medicare Other | Admitting: *Deleted

## 2017-01-07 ENCOUNTER — Other Ambulatory Visit: Payer: Self-pay | Admitting: *Deleted

## 2017-01-07 NOTE — Patient Outreach (Signed)
[facility]) Care Management  01/07/2017  YOLANDA HUFFSTETLER 25-Aug-1918 [id number]   CSW drove out to [place] where patient was residing to receive short-term rehabilitative services, only to find that patient was discharged on April 11th.  CSW was able to make contact with patient's daughter, [name], who confirmed that patient was transferred from [facility], to [facility], [place], on Wednesday.  [name] admitted that patient was not safe to return home to live alone so placement was required in a long-term care assisted living facility.  Mrs. [name] admits that she is pleased with the care that patient is receiving there and that patient appears to be adjusting well to her new environment. CSW will perform a case closure on patient, as all goals of treatment have been met from social work standpoint and no additional social work needs have been identified at this time.  CSW will notify patient's RNCM with [facility], Wilfred Curtis of CSW's plans to close patient's case.  CSW will fax an update to patient's Primary Care Physician, Dr. Cari Caraway to ensure that they are aware of CSW's involvement with patient's plan of care.  CSW will submit a case closure request to Verlon Setting, Care Management Assistant with [facility], in the form of an [employer].  CSW will ensure that Mrs. Comer is aware of [name]'s, RNCM with [facility], continued involvement with patient's care. Nat Christen, BSW, MSW, LCSW  Licensed Education officer, [facility]  Mailing [place] [address] [place], [place] [address] Physical Address-[address], [place], [place] [address] Toll Free Main # [phone] Fax # [phone] Cell #  [phone]  Office # [phone] [email or web address]

## 2017-02-02 ENCOUNTER — Ambulatory Visit (INDEPENDENT_AMBULATORY_CARE_PROVIDER_SITE_OTHER): Payer: Medicare Other | Admitting: *Deleted

## 2017-02-02 DIAGNOSIS — I441 Atrioventricular block, second degree: Secondary | ICD-10-CM

## 2017-02-02 LAB — CUP PACEART REMOTE DEVICE CHECK
Battery Voltage: 3.01 V
Brady Statistic AP VP Percent: 89 %
Brady Statistic RA Percent Paced: 60 %
Brady Statistic RV Percent Paced: 99 %
Implantable Lead Implant Date: 20150818
Implantable Lead Location: 753859
Implantable Pulse Generator Implant Date: 20150818
Lead Channel Impedance Value: 600 Ohm
Lead Channel Pacing Threshold Amplitude: 1 V
Lead Channel Pacing Threshold Pulse Width: 0.5 ms
Lead Channel Setting Pacing Amplitude: 2 V
Lead Channel Setting Sensing Sensitivity: 3 mV
MDC IDC LEAD IMPLANT DT: 20150818
MDC IDC LEAD LOCATION: 753860
MDC IDC MSMT BATTERY REMAINING LONGEVITY: 113 mo
MDC IDC MSMT BATTERY REMAINING PERCENTAGE: 95.5 %
MDC IDC MSMT LEADCHNL RA IMPEDANCE VALUE: 360 Ohm
MDC IDC MSMT LEADCHNL RA SENSING INTR AMPL: 0.6 mV
MDC IDC MSMT LEADCHNL RV PACING THRESHOLD AMPLITUDE: 0.75 V
MDC IDC MSMT LEADCHNL RV PACING THRESHOLD PULSEWIDTH: 0.5 ms
MDC IDC MSMT LEADCHNL RV SENSING INTR AMPL: 12 mV
MDC IDC SESS DTM: 20180508074629
MDC IDC SET LEADCHNL RV PACING AMPLITUDE: 1 V
MDC IDC SET LEADCHNL RV PACING PULSEWIDTH: 0.5 ms
MDC IDC STAT BRADY AP VS PERCENT: 1 %
MDC IDC STAT BRADY AS VP PERCENT: 9.7 %
MDC IDC STAT BRADY AS VS PERCENT: 1 %
Pulse Gen Serial Number: 7663995

## 2017-02-02 NOTE — Progress Notes (Signed)
Remote pacemaker transmission.   

## 2017-02-04 ENCOUNTER — Encounter: Payer: Self-pay | Admitting: Cardiology

## 2017-02-04 NOTE — Progress Notes (Signed)
Letter  

## 2017-03-15 ENCOUNTER — Other Ambulatory Visit: Payer: Self-pay | Admitting: Internal Medicine

## 2017-05-04 ENCOUNTER — Ambulatory Visit (INDEPENDENT_AMBULATORY_CARE_PROVIDER_SITE_OTHER): Payer: Medicare Other | Admitting: *Deleted

## 2017-05-04 DIAGNOSIS — I441 Atrioventricular block, second degree: Secondary | ICD-10-CM | POA: Diagnosis not present

## 2017-05-04 NOTE — Progress Notes (Signed)
Remote pacemaker transmission.   

## 2017-05-05 ENCOUNTER — Encounter: Payer: Self-pay | Admitting: Cardiology

## 2017-05-10 LAB — CUP PACEART REMOTE DEVICE CHECK
Battery Remaining Longevity: 115 mo
Battery Remaining Percentage: 95.5 %
Battery Voltage: 2.99 V
Brady Statistic RA Percent Paced: 41 %
Date Time Interrogation Session: 20180808202913
Implantable Lead Implant Date: 20150818
Implantable Lead Location: 753859
Implantable Pulse Generator Implant Date: 20150818
Lead Channel Impedance Value: 350 Ohm
Lead Channel Pacing Threshold Amplitude: 1 V
Lead Channel Pacing Threshold Pulse Width: 0.5 ms
Lead Channel Sensing Intrinsic Amplitude: 0.5 mV
MDC IDC LEAD IMPLANT DT: 20150818
MDC IDC LEAD LOCATION: 753860
MDC IDC MSMT LEADCHNL RV IMPEDANCE VALUE: 560 Ohm
MDC IDC MSMT LEADCHNL RV PACING THRESHOLD AMPLITUDE: 0.75 V
MDC IDC MSMT LEADCHNL RV PACING THRESHOLD PULSEWIDTH: 0.5 ms
MDC IDC MSMT LEADCHNL RV SENSING INTR AMPL: 12 mV
MDC IDC PG SERIAL: 7663995
MDC IDC SET LEADCHNL RA PACING AMPLITUDE: 2 V
MDC IDC SET LEADCHNL RV PACING AMPLITUDE: 1 V
MDC IDC SET LEADCHNL RV PACING PULSEWIDTH: 0.5 ms
MDC IDC SET LEADCHNL RV SENSING SENSITIVITY: 3 mV
MDC IDC STAT BRADY AP VP PERCENT: 89 %
MDC IDC STAT BRADY AP VS PERCENT: 1 %
MDC IDC STAT BRADY AS VP PERCENT: 9.8 %
MDC IDC STAT BRADY AS VS PERCENT: 1 %
MDC IDC STAT BRADY RV PERCENT PACED: 99 %

## 2017-08-03 ENCOUNTER — Ambulatory Visit (INDEPENDENT_AMBULATORY_CARE_PROVIDER_SITE_OTHER): Payer: Medicare Other | Admitting: *Deleted

## 2017-08-03 DIAGNOSIS — I441 Atrioventricular block, second degree: Secondary | ICD-10-CM | POA: Diagnosis not present

## 2017-08-03 NOTE — Progress Notes (Signed)
Remote pacemaker transmission.   

## 2017-08-04 ENCOUNTER — Encounter: Payer: Self-pay | Admitting: Cardiology

## 2017-08-10 ENCOUNTER — Ambulatory Visit (INDEPENDENT_AMBULATORY_CARE_PROVIDER_SITE_OTHER): Payer: Medicare Other | Admitting: Internal Medicine

## 2017-08-10 ENCOUNTER — Encounter: Payer: Self-pay | Admitting: Internal Medicine

## 2017-08-10 VITALS — BP 134/80 | HR 71 | Ht 63.0 in | Wt 115.4 lb

## 2017-08-10 DIAGNOSIS — I10 Essential (primary) hypertension: Secondary | ICD-10-CM

## 2017-08-10 DIAGNOSIS — I4819 Other persistent atrial fibrillation: Secondary | ICD-10-CM

## 2017-08-10 DIAGNOSIS — I442 Atrioventricular block, complete: Secondary | ICD-10-CM

## 2017-08-10 DIAGNOSIS — I481 Persistent atrial fibrillation: Secondary | ICD-10-CM | POA: Diagnosis not present

## 2017-08-10 DIAGNOSIS — I441 Atrioventricular block, second degree: Secondary | ICD-10-CM | POA: Diagnosis not present

## 2017-08-10 DIAGNOSIS — I63512 Cerebral infarction due to unspecified occlusion or stenosis of left middle cerebral artery: Secondary | ICD-10-CM

## 2017-08-10 DIAGNOSIS — I2510 Atherosclerotic heart disease of native coronary artery without angina pectoris: Secondary | ICD-10-CM

## 2017-08-10 LAB — CUP PACEART INCLINIC DEVICE CHECK
Brady Statistic RV Percent Paced: 99 %
Date Time Interrogation Session: 20181114124358
Implantable Lead Implant Date: 20150818
Implantable Lead Location: 753859
Implantable Lead Model: 1948
Implantable Pulse Generator Implant Date: 20150818
Lead Channel Impedance Value: 625 Ohm
Lead Channel Pacing Threshold Pulse Width: 0.5 ms
Lead Channel Pacing Threshold Pulse Width: 0.5 ms
Lead Channel Setting Pacing Amplitude: 2 V
Lead Channel Setting Pacing Pulse Width: 0.5 ms
MDC IDC LEAD IMPLANT DT: 20150818
MDC IDC LEAD LOCATION: 753860
MDC IDC MSMT BATTERY REMAINING LONGEVITY: 122 mo
MDC IDC MSMT BATTERY VOLTAGE: 2.99 V
MDC IDC MSMT LEADCHNL RA IMPEDANCE VALUE: 387.5 Ohm
MDC IDC MSMT LEADCHNL RA SENSING INTR AMPL: 0.6 mV
MDC IDC MSMT LEADCHNL RV PACING THRESHOLD AMPLITUDE: 0.75 V
MDC IDC MSMT LEADCHNL RV PACING THRESHOLD AMPLITUDE: 0.75 V
MDC IDC MSMT LEADCHNL RV SENSING INTR AMPL: 5.8 mV
MDC IDC SET LEADCHNL RV PACING AMPLITUDE: 1.125
MDC IDC SET LEADCHNL RV SENSING SENSITIVITY: 3 mV
MDC IDC STAT BRADY RA PERCENT PACED: 31 %
Pulse Gen Model: 2240
Pulse Gen Serial Number: 7663995

## 2017-08-10 NOTE — Patient Instructions (Signed)
Medication Instructions:  Your physician recommends that you continue on your current medications as directed. Please refer to the Current Medication list given to you today.  -- If you need a refill on your cardiac medications before your next appointment, please call your pharmacy. --  Labwork: None ordered  Testing/Procedures: None ordered  Follow-Up:  Your physician wants you to follow-up in: 1 year with Chanetta Marshall NP.  Remote monitoring is used to monitor your Pacemaker  from home. This monitoring reduces the number of office visits required to check your device to one time per year. It allows [place] to keep an eye on the functioning of your device to ensure it is working properly. You are scheduled for a device check from home on 11/02/2017. You may send your transmission at any time that day. If you have a wireless device, the transmission will be sent automatically. After your physician reviews your transmission, you will receive a postcard with your next transmission date.      Thank you for choosing [facility]!!   Frederik Schmidt, RN [phone]  Any Other Special Instructions Will Be Listed Below (If Applicable).

## 2017-08-10 NOTE — Progress Notes (Signed)
PCP: Cari Caraway, MD Primary Cardiologist:  Dr Meda Coffee Primary EP:  Dr Rayann Heman  Stephanie Cochran is a 83 y.o. female who presents today for routine electrophysiology followup.  Since last being seen in our clinic, the patient reports doing very well.  Today, she denies symptoms of palpitations, chest pain, shortness of breath,  lower extremity edema, dizziness, presyncope, or syncope.  The patient is otherwise without complaint today.   Past Medical History:  Diagnosis Date  . Aortic stenosis    a. s/p bioprosthetic AVR 2007 [facility]);  b. Echo (04/2009 - [facility] in [place], [place]):  Mild LVH, normal LVF, anterior WMA, AVR ok (mean 4 mmHg), MAC, mild MR, mild TV stenosis, RVSP 44 mmHg  . Arthritis   . Atrial flutter ([place])    post operative  . Breast cancer ([place])    left mastectomy  . CAD (coronary artery disease)    s/p CABG 2007 at [facility]  . Diverticulosis    history  . HLD (hyperlipidemia)   . HTN (hypertension)   . Hx of echocardiogram    Echo (8/15):  Mild focal basal septal hypertrophy, EF 60-65%, no RWMA, AVR ok (mean 8 mmHg), MAC, mild MS (mean 4 mmHg), mod MR, mild LAE, PASP 54 mmHg  . Mobitz II    a. s/p STJ dual chamber PPM 2015  . Paroxysmal atrial fibrillation (H[facility])   . Post-menopausal   . PVD (peripheral vascular disease) ([place])    Past Surgical History:  Procedure Laterality Date  . AORTIC VALVE REPLACEMENT  2007   [facility]   . ARTERIOVENOUS GRAFT PLACEMENT W/ ENDOSCOPIC VEIN HARVEST Left    left leg  . BREAST SURGERY    . CORONARY ARTERY BYPASS GRAFT Left 2007   x3, left internal mammary artery to the left anterior descending, saphenous vein graft tot he circumflex coronary  . EYE SURGERY    . HEMORROIDECTOMY    . MASTECTOMY Left 2008   left, breast cancer, followed by a hematology oncologist  . RENAL ARTERY STENT  2006, 2010  . TONSILLECTOMY  1943    ROS- all systems are reviewed and negative except as per HPI  above  Current Outpatient Medications  Medication Sig Dispense Refill  . aspirin EC 81 MG tablet Take 81 mg by mouth daily.    . carvedilol (COREG) 6.25 MG tablet Take 1 tablet (6.25 mg total) by mouth daily. 90 tablet 3  . Cholecalciferol (VITAMIN D) 2000 UNITS tablet Take 2,000 Units by mouth daily.    . clopidogrel (PLAVIX) 75 MG tablet Take 75 mg by mouth daily.    . Cyanocobalamin (B-12) 1000 MCG CAPS Take 1,000 mcg by mouth daily.     . diphenhydramine-acetaminophen (TYLENOL PM) 25-500 MG TABS Take 0.5 tablets by mouth at bedtime as needed (sleep).     . Liniments (SALONPAS PAIN RELIEF PATCH EX) Apply 1 patch topically daily as needed.    . Nutritional Supplements (ENSURE ENLIVE PO) Take 237 mLs by mouth 2 (two) times daily.    . ondansetron (ZOFRAN ODT) 4 MG disintegrating tablet Take 1 tablet (4 mg total) by mouth every 8 (eight) hours as needed for nausea or vomiting. 20 tablet 0  . Polyethyl Glycol-Propyl Glycol (SYSTANE OP) Place 1 drop into both eyes 2 (two) times daily as needed (dry eyes).    . rosuvastatin (CRESTOR) 10 MG tablet Take 1 tablet (10 mg total) by mouth daily.    .Marland Kitchen  senna-docusate (SENOKOT-S) 8.6-50 MG tablet Take 1 tablet by mouth at bedtime as needed for mild constipation.    . vitamin C (ASCORBIC ACID) 500 MG tablet Take 1,000 mg by mouth daily.    . Vitamin D, Ergocalciferol, (DRISDOL) 50000 UNITS CAPS capsule Take 1 capsule (50,000 Units total) by mouth every 30 (thirty) days. On or about the 1st of each month 6 capsule 0  . vitamin E 400 UNIT capsule Take 400 Units by mouth daily. Reported on 12/12/2015     No current facility-administered medications for this visit.     Physical Exam: Vitals:   08/10/17 1205  BP: 134/80  Pulse: 71  SpO2: 98%  Weight: 115 lb 6.4 oz (52.3 kg)  Height: 5' 3"  (1.6 m)    GEN- The patient is well appearing, alert and oriented x 3 today.   Head- normocephalic, atraumatic Eyes-  Sclera clear, conjunctiva pink Ears- hearing  intact Oropharynx- clear Lungs- Clear to ausculation bilaterally, normal work of breathing Chest- pacemaker pocket is well healed Heart- Regular rate and rhythm (paced) GI- soft, NT, ND, + BS Extremities- no clubbing, cyanosis, or edema  Pacemaker interrogation- reviewed in detail today,  See PACEART report   Assessment and Plan:  1. Symptomatic complete heart block Normal pacemaker function See Pace Art report No changes today Consider reprogramming VVIR upon return  2. afib She has been in persistent afib since march chads2vasc score is at least 5 She is on ASA and plavix for CAD.  I think that she may be better served at this time to stop ASA/plavix and start [facility] therapy.  We discussed this at length today.  She is clear in her decision to make no changes today.  She understands risks of stroke with AF.  She will discuss this further with Dr Meda Coffee when she sees her again.  3. CAD No ischemic changes, no changes  4. HTN Stable No change required today  [name] Return to see EP NP every year  [name] MD, [facility] 08/10/2017 12:23 PM

## 2017-08-21 LAB — CUP PACEART REMOTE DEVICE CHECK
Battery Remaining Percentage: 95.5 %
Brady Statistic AP VS Percent: 1 %
Brady Statistic AS VP Percent: 9.8 %
Brady Statistic RV Percent Paced: 99 %
Date Time Interrogation Session: 20181106070012
Implantable Lead Location: 753860
Implantable Lead Model: 1948
Lead Channel Pacing Threshold Amplitude: 0.875 V
Lead Channel Sensing Intrinsic Amplitude: 5.1 mV
Lead Channel Setting Pacing Amplitude: 2 V
Lead Channel Setting Pacing Pulse Width: 0.5 ms
Lead Channel Setting Sensing Sensitivity: 3 mV
MDC IDC LEAD IMPLANT DT: 20150818
MDC IDC LEAD IMPLANT DT: 20150818
MDC IDC LEAD LOCATION: 753859
MDC IDC MSMT BATTERY REMAINING LONGEVITY: 119 mo
MDC IDC MSMT BATTERY VOLTAGE: 2.99 V
MDC IDC MSMT LEADCHNL RA IMPEDANCE VALUE: 360 Ohm
MDC IDC MSMT LEADCHNL RA PACING THRESHOLD AMPLITUDE: 1 V
MDC IDC MSMT LEADCHNL RA PACING THRESHOLD PULSEWIDTH: 0.5 ms
MDC IDC MSMT LEADCHNL RA SENSING INTR AMPL: 0.6 mV
MDC IDC MSMT LEADCHNL RV IMPEDANCE VALUE: 590 Ohm
MDC IDC MSMT LEADCHNL RV PACING THRESHOLD PULSEWIDTH: 0.5 ms
MDC IDC PG IMPLANT DT: 20150818
MDC IDC SET LEADCHNL RV PACING AMPLITUDE: 1.125
MDC IDC STAT BRADY AP VP PERCENT: 89 %
MDC IDC STAT BRADY AS VS PERCENT: 1 %
MDC IDC STAT BRADY RA PERCENT PACED: 32 %
Pulse Gen Model: 2240
Pulse Gen Serial Number: 7663995

## 2017-08-31 ENCOUNTER — Telehealth: Payer: Self-pay | Admitting: Cardiology

## 2017-08-31 NOTE — Telephone Encounter (Signed)
LMOVM requesting that pt send manual transmission b/c home monitor has not updated in at least 7 days.    

## 2017-11-02 ENCOUNTER — Ambulatory Visit (INDEPENDENT_AMBULATORY_CARE_PROVIDER_SITE_OTHER): Payer: Medicare Other | Admitting: *Deleted

## 2017-11-02 DIAGNOSIS — I442 Atrioventricular block, complete: Secondary | ICD-10-CM | POA: Diagnosis not present

## 2017-11-02 NOTE — Progress Notes (Signed)
Remote pacemaker transmission.   

## 2017-11-03 ENCOUNTER — Encounter: Payer: Self-pay | Admitting: Cardiology

## 2017-11-03 NOTE — Progress Notes (Signed)
Letter  

## 2017-12-05 LAB — CUP PACEART REMOTE DEVICE CHECK
Brady Statistic AP VP Percent: 50 %
Brady Statistic AS VP Percent: 48 %
Brady Statistic AS VS Percent: 0 %
Brady Statistic RV Percent Paced: 98 %
Implantable Lead Implant Date: 20150818
Implantable Lead Location: 753860
Implantable Lead Model: 1948
Lead Channel Impedance Value: 610 Ohm
Lead Channel Pacing Threshold Pulse Width: 0.5 ms
Lead Channel Sensing Intrinsic Amplitude: 12 mV
Lead Channel Setting Pacing Amplitude: 1.125
Lead Channel Setting Pacing Amplitude: 2 V
Lead Channel Setting Pacing Pulse Width: 0.5 ms
MDC IDC LEAD IMPLANT DT: 20150818
MDC IDC LEAD LOCATION: 753859
MDC IDC MSMT BATTERY REMAINING LONGEVITY: 125 mo
MDC IDC MSMT BATTERY REMAINING PERCENTAGE: 95.5 %
MDC IDC MSMT BATTERY VOLTAGE: 2.99 V
MDC IDC MSMT LEADCHNL RA IMPEDANCE VALUE: 380 Ohm
MDC IDC MSMT LEADCHNL RA PACING THRESHOLD AMPLITUDE: 1 V
MDC IDC MSMT LEADCHNL RA PACING THRESHOLD PULSEWIDTH: 0.5 ms
MDC IDC MSMT LEADCHNL RA SENSING INTR AMPL: 0.6 mV
MDC IDC MSMT LEADCHNL RV PACING THRESHOLD AMPLITUDE: 0.875 V
MDC IDC PG IMPLANT DT: 20150818
MDC IDC SESS DTM: 20190206094905
MDC IDC SET LEADCHNL RV SENSING SENSITIVITY: 3 mV
MDC IDC STAT BRADY AP VS PERCENT: 1.8 %
MDC IDC STAT BRADY RA PERCENT PACED: 1 %
Pulse Gen Serial Number: 7663995

## 2018-02-01 ENCOUNTER — Ambulatory Visit (INDEPENDENT_AMBULATORY_CARE_PROVIDER_SITE_OTHER): Payer: Medicare Other | Admitting: *Deleted

## 2018-02-01 DIAGNOSIS — I442 Atrioventricular block, complete: Secondary | ICD-10-CM

## 2018-02-01 NOTE — Progress Notes (Signed)
Remote pacemaker transmission.   

## 2018-02-02 ENCOUNTER — Encounter: Payer: Self-pay | Admitting: Cardiology

## 2018-02-22 LAB — CUP PACEART REMOTE DEVICE CHECK
Battery Voltage: 2.99 V
Brady Statistic AP VP Percent: 49 %
Brady Statistic AS VP Percent: 50 %
Brady Statistic AS VS Percent: 0 %
Brady Statistic RV Percent Paced: 98 %
Implantable Lead Implant Date: 20150818
Implantable Lead Location: 753859
Implantable Lead Location: 753860
Implantable Pulse Generator Implant Date: 20150818
Lead Channel Impedance Value: 580 Ohm
Lead Channel Pacing Threshold Amplitude: 1 V
Lead Channel Pacing Threshold Pulse Width: 0.5 ms
Lead Channel Sensing Intrinsic Amplitude: 5.7 mV
Lead Channel Setting Pacing Amplitude: 1 V
Lead Channel Setting Pacing Amplitude: 2 V
Lead Channel Setting Pacing Pulse Width: 0.5 ms
Lead Channel Setting Sensing Sensitivity: 3 mV
MDC IDC LEAD IMPLANT DT: 20150818
MDC IDC MSMT BATTERY REMAINING LONGEVITY: 125 mo
MDC IDC MSMT BATTERY REMAINING PERCENTAGE: 95.5 %
MDC IDC MSMT LEADCHNL RA IMPEDANCE VALUE: 390 Ohm
MDC IDC MSMT LEADCHNL RA PACING THRESHOLD PULSEWIDTH: 0.5 ms
MDC IDC MSMT LEADCHNL RA SENSING INTR AMPL: 0.6 mV
MDC IDC MSMT LEADCHNL RV PACING THRESHOLD AMPLITUDE: 0.75 V
MDC IDC PG SERIAL: 7663995
MDC IDC SESS DTM: 20190508080029
MDC IDC STAT BRADY AP VS PERCENT: 1.7 %
MDC IDC STAT BRADY RA PERCENT PACED: 1 %

## 2018-05-03 ENCOUNTER — Ambulatory Visit (INDEPENDENT_AMBULATORY_CARE_PROVIDER_SITE_OTHER): Payer: Medicare Other | Admitting: *Deleted

## 2018-05-03 DIAGNOSIS — I441 Atrioventricular block, second degree: Secondary | ICD-10-CM

## 2018-05-04 NOTE — Progress Notes (Signed)
Remote pacemaker transmission.   

## 2018-06-01 LAB — CUP PACEART REMOTE DEVICE CHECK
Brady Statistic AP VP Percent: 54 %
Brady Statistic AP VS Percent: 1 %
Brady Statistic AS VP Percent: 44 %
Brady Statistic AS VS Percent: 1 %
Brady Statistic RV Percent Paced: 98 %
Implantable Lead Implant Date: 20150818
Implantable Lead Implant Date: 20150818
Implantable Lead Location: 753860
Implantable Pulse Generator Implant Date: 20150818
Lead Channel Impedance Value: 560 Ohm
Lead Channel Pacing Threshold Amplitude: 0.875 V
Lead Channel Pacing Threshold Amplitude: 1 V
Lead Channel Pacing Threshold Pulse Width: 0.5 ms
Lead Channel Setting Pacing Amplitude: 1.125
Lead Channel Setting Pacing Pulse Width: 0.5 ms
Lead Channel Setting Sensing Sensitivity: 3 mV
MDC IDC LEAD LOCATION: 753859
MDC IDC MSMT BATTERY REMAINING LONGEVITY: 122 mo
MDC IDC MSMT BATTERY REMAINING PERCENTAGE: 95.5 %
MDC IDC MSMT BATTERY VOLTAGE: 2.99 V
MDC IDC MSMT LEADCHNL RA IMPEDANCE VALUE: 350 Ohm
MDC IDC MSMT LEADCHNL RA PACING THRESHOLD PULSEWIDTH: 0.5 ms
MDC IDC MSMT LEADCHNL RA SENSING INTR AMPL: 0.6 mV
MDC IDC MSMT LEADCHNL RV SENSING INTR AMPL: 6.3 mV
MDC IDC SESS DTM: 20190807080014
MDC IDC SET LEADCHNL RA PACING AMPLITUDE: 2 V
MDC IDC STAT BRADY RA PERCENT PACED: 1 %
Pulse Gen Serial Number: 7663995

## 2018-08-02 ENCOUNTER — Ambulatory Visit: Payer: Medicare Other | Admitting: *Deleted

## 2018-08-02 ENCOUNTER — Telehealth: Payer: Self-pay

## 2018-08-02 NOTE — Telephone Encounter (Signed)
Confirmed remote transmission w/ pt niece.   

## 2018-08-03 ENCOUNTER — Ambulatory Visit (INDEPENDENT_AMBULATORY_CARE_PROVIDER_SITE_OTHER): Payer: Medicare Other | Admitting: *Deleted

## 2018-08-03 DIAGNOSIS — I441 Atrioventricular block, second degree: Secondary | ICD-10-CM | POA: Diagnosis not present

## 2018-08-03 DIAGNOSIS — I442 Atrioventricular block, complete: Secondary | ICD-10-CM

## 2018-08-06 ENCOUNTER — Encounter: Payer: Self-pay | Admitting: Cardiology

## 2018-08-06 NOTE — Progress Notes (Signed)
Remote pacemaker transmission.   

## 2018-10-05 LAB — CUP PACEART REMOTE DEVICE CHECK
Battery Voltage: 2.98 V
Brady Statistic AP VP Percent: 54 %
Brady Statistic AS VP Percent: 44 %
Brady Statistic RA Percent Paced: 1 %
Brady Statistic RV Percent Paced: 98 %
Implantable Lead Implant Date: 20150818
Implantable Lead Location: 753859
Implantable Lead Model: 1948
Lead Channel Impedance Value: 360 Ohm
Lead Channel Impedance Value: 560 Ohm
Lead Channel Pacing Threshold Amplitude: 1 V
Lead Channel Pacing Threshold Pulse Width: 0.5 ms
Lead Channel Sensing Intrinsic Amplitude: 5.2 mV
Lead Channel Setting Pacing Amplitude: 2 V
MDC IDC LEAD IMPLANT DT: 20150818
MDC IDC LEAD LOCATION: 753860
MDC IDC MSMT BATTERY REMAINING LONGEVITY: 123 mo
MDC IDC MSMT BATTERY REMAINING PERCENTAGE: 95.5 %
MDC IDC MSMT LEADCHNL RA SENSING INTR AMPL: 0.6 mV
MDC IDC MSMT LEADCHNL RV PACING THRESHOLD AMPLITUDE: 0.75 V
MDC IDC MSMT LEADCHNL RV PACING THRESHOLD PULSEWIDTH: 0.5 ms
MDC IDC PG IMPLANT DT: 20150818
MDC IDC SESS DTM: 20191108015543
MDC IDC SET LEADCHNL RV PACING AMPLITUDE: 1 V
MDC IDC SET LEADCHNL RV PACING PULSEWIDTH: 0.5 ms
MDC IDC SET LEADCHNL RV SENSING SENSITIVITY: 3 mV
MDC IDC STAT BRADY AP VS PERCENT: 1 %
MDC IDC STAT BRADY AS VS PERCENT: 1 %
Pulse Gen Model: 2240
Pulse Gen Serial Number: 7663995

## 2018-11-02 ENCOUNTER — Ambulatory Visit (INDEPENDENT_AMBULATORY_CARE_PROVIDER_SITE_OTHER): Payer: Medicare Other

## 2018-11-02 DIAGNOSIS — I441 Atrioventricular block, second degree: Secondary | ICD-10-CM

## 2018-11-02 DIAGNOSIS — I5032 Chronic diastolic (congestive) heart failure: Secondary | ICD-10-CM

## 2018-11-05 LAB — CUP PACEART REMOTE DEVICE CHECK
Battery Remaining Longevity: 124 mo
Battery Remaining Percentage: 95.5 %
Battery Voltage: 2.98 V
Brady Statistic AS VS Percent: 1 %
Brady Statistic RA Percent Paced: 1 %
Brady Statistic RV Percent Paced: 99 %
Date Time Interrogation Session: 20200205090013
Implantable Lead Implant Date: 20150818
Implantable Lead Location: 753859
Implantable Lead Location: 753860
Implantable Pulse Generator Implant Date: 20150818
Lead Channel Impedance Value: 590 Ohm
Lead Channel Pacing Threshold Amplitude: 0.75 V
Lead Channel Pacing Threshold Amplitude: 1 V
Lead Channel Pacing Threshold Pulse Width: 0.5 ms
Lead Channel Sensing Intrinsic Amplitude: 0.6 mV
Lead Channel Setting Pacing Pulse Width: 0.5 ms
Lead Channel Setting Sensing Sensitivity: 3 mV
MDC IDC LEAD IMPLANT DT: 20150818
MDC IDC MSMT LEADCHNL RA IMPEDANCE VALUE: 350 Ohm
MDC IDC MSMT LEADCHNL RV PACING THRESHOLD PULSEWIDTH: 0.5 ms
MDC IDC MSMT LEADCHNL RV SENSING INTR AMPL: 3.4 mV
MDC IDC SET LEADCHNL RA PACING AMPLITUDE: 2 V
MDC IDC SET LEADCHNL RV PACING AMPLITUDE: 1 V
MDC IDC STAT BRADY AP VP PERCENT: 54 %
MDC IDC STAT BRADY AP VS PERCENT: 1 %
MDC IDC STAT BRADY AS VP PERCENT: 44 %
Pulse Gen Model: 2240
Pulse Gen Serial Number: 7663995

## 2018-11-13 NOTE — Progress Notes (Signed)
Remote pacemaker transmission.   

## 2019-02-08 ENCOUNTER — Other Ambulatory Visit: Payer: Self-pay

## 2019-02-08 ENCOUNTER — Ambulatory Visit (INDEPENDENT_AMBULATORY_CARE_PROVIDER_SITE_OTHER): Payer: Medicare Other | Admitting: *Deleted

## 2019-02-08 DIAGNOSIS — I442 Atrioventricular block, complete: Secondary | ICD-10-CM

## 2019-02-10 LAB — CUP PACEART REMOTE DEVICE CHECK
Date Time Interrogation Session: 20200516091039
Implantable Lead Implant Date: 20150818
Implantable Lead Implant Date: 20150818
Implantable Lead Location: 753859
Implantable Lead Location: 753860
Implantable Lead Model: 1948
Implantable Pulse Generator Implant Date: 20150818
Pulse Gen Model: 2240
Pulse Gen Serial Number: 7663995

## 2019-02-15 ENCOUNTER — Encounter: Payer: Self-pay | Admitting: Cardiology

## 2019-02-15 NOTE — Progress Notes (Signed)
Remote pacemaker transmission.   

## 2019-05-10 ENCOUNTER — Ambulatory Visit (INDEPENDENT_AMBULATORY_CARE_PROVIDER_SITE_OTHER): Payer: Medicare Other | Admitting: *Deleted

## 2019-05-10 DIAGNOSIS — I442 Atrioventricular block, complete: Secondary | ICD-10-CM | POA: Diagnosis not present

## 2019-05-10 LAB — CUP PACEART REMOTE DEVICE CHECK
Battery Remaining Longevity: 116 mo
Battery Remaining Percentage: 95.5 %
Battery Voltage: 2.96 V
Brady Statistic AP VP Percent: 55 %
Brady Statistic AP VS Percent: 1 %
Brady Statistic AS VP Percent: 44 %
Brady Statistic AS VS Percent: 1 %
Brady Statistic RA Percent Paced: 1 %
Brady Statistic RV Percent Paced: 99 %
Date Time Interrogation Session: 20200813060524
Implantable Lead Implant Date: 20150818
Implantable Lead Implant Date: 20150818
Implantable Lead Location: 753859
Implantable Lead Location: 753860
Implantable Lead Model: 1948
Implantable Pulse Generator Implant Date: 20150818
Lead Channel Impedance Value: 350 Ohm
Lead Channel Impedance Value: 550 Ohm
Lead Channel Pacing Threshold Amplitude: 0.75 V
Lead Channel Pacing Threshold Pulse Width: 0.5 ms
Lead Channel Setting Pacing Amplitude: 1 V
Lead Channel Setting Pacing Amplitude: 2 V
Lead Channel Setting Pacing Pulse Width: 0.5 ms
Lead Channel Setting Sensing Sensitivity: 3 mV
Pulse Gen Model: 2240
Pulse Gen Serial Number: 7663995

## 2019-05-21 ENCOUNTER — Encounter: Payer: Self-pay | Admitting: Cardiology

## 2019-05-21 NOTE — Progress Notes (Signed)
Remote pacemaker transmission.   

## 2019-06-07 ENCOUNTER — Other Ambulatory Visit: Payer: Self-pay | Admitting: Physician Assistant

## 2019-06-07 ENCOUNTER — Ambulatory Visit
Admission: RE | Admit: 2019-06-07 | Discharge: 2019-06-07 | Disposition: A | Discharge status: Home / Self Care | Payer: Medicare Other | Source: Ambulatory Visit | Attending: Physician Assistant | Admitting: Physician Assistant

## 2019-06-07 DIAGNOSIS — M25512 Pain in left shoulder: Secondary | ICD-10-CM

## 2019-06-07 DIAGNOSIS — M25522 Pain in left elbow: Secondary | ICD-10-CM

## 2019-08-09 ENCOUNTER — Encounter: Payer: Medicare Other | Admitting: *Deleted

## 2019-08-13 ENCOUNTER — Telehealth: Payer: Self-pay

## 2019-08-13 NOTE — Telephone Encounter (Signed)
Left message for patient to remind of missed remote transmission.  

## 2019-08-14 ENCOUNTER — Ambulatory Visit (INDEPENDENT_AMBULATORY_CARE_PROVIDER_SITE_OTHER): Payer: Medicare Other | Admitting: *Deleted

## 2019-08-14 DIAGNOSIS — I480 Paroxysmal atrial fibrillation: Secondary | ICD-10-CM | POA: Diagnosis not present

## 2019-08-14 DIAGNOSIS — I442 Atrioventricular block, complete: Secondary | ICD-10-CM

## 2019-08-15 LAB — CUP PACEART REMOTE DEVICE CHECK
Battery Remaining Longevity: 115 mo
Battery Remaining Percentage: 95.5 %
Battery Voltage: 2.96 V
Brady Statistic AP VP Percent: 59 %
Brady Statistic AP VS Percent: 1 %
Brady Statistic AS VP Percent: 40 %
Brady Statistic AS VS Percent: 1 %
Brady Statistic RA Percent Paced: 1 %
Brady Statistic RV Percent Paced: 99 %
Date Time Interrogation Session: 20201117184824
Implantable Lead Implant Date: 20150818
Implantable Lead Implant Date: 20150818
Implantable Lead Location: 753859
Implantable Lead Location: 753860
Implantable Lead Model: 1948
Implantable Pulse Generator Implant Date: 20150818
Lead Channel Impedance Value: 340 Ohm
Lead Channel Impedance Value: 540 Ohm
Lead Channel Pacing Threshold Amplitude: 0.75 V
Lead Channel Pacing Threshold Amplitude: 1 V
Lead Channel Pacing Threshold Pulse Width: 0.5 ms
Lead Channel Pacing Threshold Pulse Width: 0.5 ms
Lead Channel Sensing Intrinsic Amplitude: 0.6 mV
Lead Channel Sensing Intrinsic Amplitude: 3.4 mV
Lead Channel Setting Pacing Amplitude: 1 V
Lead Channel Setting Pacing Amplitude: 2 V
Lead Channel Setting Pacing Pulse Width: 0.5 ms
Lead Channel Setting Sensing Sensitivity: 3 mV
Pulse Gen Model: 2240
Pulse Gen Serial Number: 7663995

## 2019-09-11 NOTE — Progress Notes (Signed)
Remote pacemaker transmission.   

## 2019-11-13 ENCOUNTER — Ambulatory Visit (INDEPENDENT_AMBULATORY_CARE_PROVIDER_SITE_OTHER): Payer: Medicare Other | Admitting: *Deleted

## 2019-11-13 DIAGNOSIS — I480 Paroxysmal atrial fibrillation: Secondary | ICD-10-CM | POA: Diagnosis not present

## 2019-11-14 LAB — CUP PACEART REMOTE DEVICE CHECK
Battery Remaining Longevity: 110 mo
Battery Remaining Percentage: 89 %
Battery Voltage: 2.95 V
Brady Statistic AP VP Percent: 59 %
Brady Statistic AP VS Percent: 1 %
Brady Statistic AS VP Percent: 40 %
Brady Statistic AS VS Percent: 1 %
Brady Statistic RA Percent Paced: 1 %
Brady Statistic RV Percent Paced: 99 %
Date Time Interrogation Session: 20210216223033
Implantable Lead Implant Date: 20150818
Implantable Lead Implant Date: 20150818
Implantable Lead Location: 753859
Implantable Lead Location: 753860
Implantable Lead Model: 1948
Implantable Pulse Generator Implant Date: 20150818
Lead Channel Impedance Value: 380 Ohm
Lead Channel Impedance Value: 600 Ohm
Lead Channel Pacing Threshold Amplitude: 0.625 V
Lead Channel Pacing Threshold Amplitude: 1 V
Lead Channel Pacing Threshold Pulse Width: 0.5 ms
Lead Channel Pacing Threshold Pulse Width: 0.5 ms
Lead Channel Sensing Intrinsic Amplitude: 0.6 mV
Lead Channel Sensing Intrinsic Amplitude: 3.4 mV
Lead Channel Setting Pacing Amplitude: 0.875
Lead Channel Setting Pacing Amplitude: 2 V
Lead Channel Setting Pacing Pulse Width: 0.5 ms
Lead Channel Setting Sensing Sensitivity: 3 mV
Pulse Gen Model: 2240
Pulse Gen Serial Number: 7663995

## 2019-11-14 NOTE — Progress Notes (Signed)
PPM Remote  

## 2020-02-12 ENCOUNTER — Ambulatory Visit (INDEPENDENT_AMBULATORY_CARE_PROVIDER_SITE_OTHER): Payer: Medicare Other | Admitting: *Deleted

## 2020-02-12 DIAGNOSIS — I509 Heart failure, unspecified: Secondary | ICD-10-CM

## 2020-02-12 DIAGNOSIS — I441 Atrioventricular block, second degree: Secondary | ICD-10-CM | POA: Diagnosis not present

## 2020-02-12 LAB — CUP PACEART REMOTE DEVICE CHECK
Battery Remaining Longevity: 107 mo
Battery Remaining Percentage: 89 %
Battery Voltage: 2.95 V
Brady Statistic AP VP Percent: 55 %
Brady Statistic AP VS Percent: 1 %
Brady Statistic AS VP Percent: 44 %
Brady Statistic AS VS Percent: 1 %
Brady Statistic RA Percent Paced: 1 %
Brady Statistic RV Percent Paced: 99 %
Date Time Interrogation Session: 20210518080911
Implantable Lead Implant Date: 20150818
Implantable Lead Implant Date: 20150818
Implantable Lead Location: 753859
Implantable Lead Location: 753860
Implantable Lead Model: 1948
Implantable Pulse Generator Implant Date: 20150818
Lead Channel Impedance Value: 360 Ohm
Lead Channel Impedance Value: 540 Ohm
Lead Channel Pacing Threshold Amplitude: 0.875 V
Lead Channel Pacing Threshold Amplitude: 1 V
Lead Channel Pacing Threshold Pulse Width: 0.5 ms
Lead Channel Pacing Threshold Pulse Width: 0.5 ms
Lead Channel Sensing Intrinsic Amplitude: 0.6 mV
Lead Channel Sensing Intrinsic Amplitude: 3.4 mV
Lead Channel Setting Pacing Amplitude: 1.125
Lead Channel Setting Pacing Amplitude: 2 V
Lead Channel Setting Pacing Pulse Width: 0.5 ms
Lead Channel Setting Sensing Sensitivity: 3 mV
Pulse Gen Model: 2240
Pulse Gen Serial Number: 7663995

## 2020-02-14 NOTE — Progress Notes (Signed)
Remote pacemaker transmission.   

## 2020-03-03 ENCOUNTER — Inpatient Hospital Stay (HOSPITAL_COMMUNITY)
Admission: EM | Admit: 2020-03-03 | Discharge: 2020-03-10 | DRG: 292 | Disposition: A | Discharge status: Skilled Nursing Facility | Payer: Medicare Other | Source: Skilled Nursing Facility | Attending: Internal Medicine | Admitting: Internal Medicine

## 2020-03-03 ENCOUNTER — Emergency Department (HOSPITAL_COMMUNITY): Payer: Medicare Other

## 2020-03-03 ENCOUNTER — Other Ambulatory Visit: Payer: Self-pay

## 2020-03-03 ENCOUNTER — Encounter (HOSPITAL_COMMUNITY): Payer: Self-pay | Admitting: Emergency Medicine

## 2020-03-03 DIAGNOSIS — I442 Atrioventricular block, complete: Secondary | ICD-10-CM | POA: Diagnosis present

## 2020-03-03 DIAGNOSIS — I739 Peripheral vascular disease, unspecified: Secondary | ICD-10-CM | POA: Diagnosis present

## 2020-03-03 DIAGNOSIS — I501 Left ventricular failure: Secondary | ICD-10-CM | POA: Diagnosis not present

## 2020-03-03 DIAGNOSIS — Z886 Allergy status to analgesic agent status: Secondary | ICD-10-CM

## 2020-03-03 DIAGNOSIS — Z953 Presence of xenogenic heart valve: Secondary | ICD-10-CM | POA: Diagnosis not present

## 2020-03-03 DIAGNOSIS — Z800 Family history of malignant neoplasm of digestive organs: Secondary | ICD-10-CM

## 2020-03-03 DIAGNOSIS — Z951 Presence of aortocoronary bypass graft: Secondary | ICD-10-CM

## 2020-03-03 DIAGNOSIS — J189 Pneumonia, unspecified organism: Secondary | ICD-10-CM

## 2020-03-03 DIAGNOSIS — F0390 Unspecified dementia without behavioral disturbance: Secondary | ICD-10-CM | POA: Diagnosis present

## 2020-03-03 DIAGNOSIS — I110 Hypertensive heart disease with heart failure: Secondary | ICD-10-CM | POA: Diagnosis not present

## 2020-03-03 DIAGNOSIS — I5033 Acute on chronic diastolic (congestive) heart failure: Secondary | ICD-10-CM | POA: Diagnosis present

## 2020-03-03 DIAGNOSIS — Z853 Personal history of malignant neoplasm of breast: Secondary | ICD-10-CM | POA: Diagnosis not present

## 2020-03-03 DIAGNOSIS — R531 Weakness: Secondary | ICD-10-CM | POA: Diagnosis not present

## 2020-03-03 DIAGNOSIS — Z9012 Acquired absence of left breast and nipple: Secondary | ICD-10-CM

## 2020-03-03 DIAGNOSIS — Z7902 Long term (current) use of antithrombotics/antiplatelets: Secondary | ICD-10-CM

## 2020-03-03 DIAGNOSIS — Z880 Allergy status to penicillin: Secondary | ICD-10-CM

## 2020-03-03 DIAGNOSIS — R1011 Right upper quadrant pain: Secondary | ICD-10-CM | POA: Diagnosis present

## 2020-03-03 DIAGNOSIS — Z885 Allergy status to narcotic agent status: Secondary | ICD-10-CM | POA: Diagnosis not present

## 2020-03-03 DIAGNOSIS — J9600 Acute respiratory failure, unspecified whether with hypoxia or hypercapnia: Secondary | ICD-10-CM | POA: Insufficient documentation

## 2020-03-03 DIAGNOSIS — Z950 Presence of cardiac pacemaker: Secondary | ICD-10-CM

## 2020-03-03 DIAGNOSIS — I4891 Unspecified atrial fibrillation: Secondary | ICD-10-CM | POA: Diagnosis present

## 2020-03-03 DIAGNOSIS — M1990 Unspecified osteoarthritis, unspecified site: Secondary | ICD-10-CM | POA: Diagnosis present

## 2020-03-03 DIAGNOSIS — I2510 Atherosclerotic heart disease of native coronary artery without angina pectoris: Secondary | ICD-10-CM | POA: Diagnosis present

## 2020-03-03 DIAGNOSIS — Z79899 Other long term (current) drug therapy: Secondary | ICD-10-CM

## 2020-03-03 DIAGNOSIS — I10 Essential (primary) hypertension: Secondary | ICD-10-CM | POA: Diagnosis present

## 2020-03-03 DIAGNOSIS — Z66 Do not resuscitate: Secondary | ICD-10-CM | POA: Diagnosis present

## 2020-03-03 DIAGNOSIS — I509 Heart failure, unspecified: Secondary | ICD-10-CM

## 2020-03-03 DIAGNOSIS — Z7982 Long term (current) use of aspirin: Secondary | ICD-10-CM

## 2020-03-03 DIAGNOSIS — I350 Nonrheumatic aortic (valve) stenosis: Secondary | ICD-10-CM | POA: Diagnosis present

## 2020-03-03 DIAGNOSIS — Z952 Presence of prosthetic heart valve: Secondary | ICD-10-CM

## 2020-03-03 DIAGNOSIS — D539 Nutritional anemia, unspecified: Secondary | ICD-10-CM | POA: Diagnosis present

## 2020-03-03 DIAGNOSIS — E876 Hypokalemia: Secondary | ICD-10-CM | POA: Diagnosis present

## 2020-03-03 DIAGNOSIS — Z515 Encounter for palliative care: Secondary | ICD-10-CM | POA: Diagnosis not present

## 2020-03-03 DIAGNOSIS — Z87891 Personal history of nicotine dependence: Secondary | ICD-10-CM

## 2020-03-03 DIAGNOSIS — J90 Pleural effusion, not elsewhere classified: Secondary | ICD-10-CM

## 2020-03-03 DIAGNOSIS — I480 Paroxysmal atrial fibrillation: Secondary | ICD-10-CM | POA: Diagnosis present

## 2020-03-03 DIAGNOSIS — E785 Hyperlipidemia, unspecified: Secondary | ICD-10-CM | POA: Diagnosis present

## 2020-03-03 DIAGNOSIS — R52 Pain, unspecified: Secondary | ICD-10-CM | POA: Diagnosis not present

## 2020-03-03 DIAGNOSIS — Z20822 Contact with and (suspected) exposure to covid-19: Secondary | ICD-10-CM | POA: Diagnosis present

## 2020-03-03 DIAGNOSIS — I340 Nonrheumatic mitral (valve) insufficiency: Secondary | ICD-10-CM | POA: Diagnosis not present

## 2020-03-03 DIAGNOSIS — R0603 Acute respiratory distress: Secondary | ICD-10-CM | POA: Diagnosis not present

## 2020-03-03 DIAGNOSIS — R0600 Dyspnea, unspecified: Secondary | ICD-10-CM

## 2020-03-03 DIAGNOSIS — J9811 Atelectasis: Secondary | ICD-10-CM | POA: Diagnosis present

## 2020-03-03 DIAGNOSIS — I4892 Unspecified atrial flutter: Secondary | ICD-10-CM | POA: Diagnosis present

## 2020-03-03 DIAGNOSIS — Z7401 Bed confinement status: Secondary | ICD-10-CM

## 2020-03-03 DIAGNOSIS — D638 Anemia in other chronic diseases classified elsewhere: Secondary | ICD-10-CM | POA: Diagnosis present

## 2020-03-03 DIAGNOSIS — I361 Nonrheumatic tricuspid (valve) insufficiency: Secondary | ICD-10-CM | POA: Diagnosis not present

## 2020-03-03 DIAGNOSIS — Z7189 Other specified counseling: Secondary | ICD-10-CM | POA: Diagnosis not present

## 2020-03-03 LAB — BRAIN NATRIURETIC PEPTIDE: B Natriuretic Peptide: 171.8 pg/mL — ABNORMAL HIGH (ref 0.0–100.0)

## 2020-03-03 LAB — CBC WITH DIFFERENTIAL/PLATELET
Abs Immature Granulocytes: 0.02 10*3/uL (ref 0.00–0.07)
Basophils Absolute: 0 10*3/uL (ref 0.0–0.1)
Basophils Relative: 1 %
Eosinophils Absolute: 0.1 10*3/uL (ref 0.0–0.5)
Eosinophils Relative: 3 %
HCT: 34.5 % — ABNORMAL LOW (ref 36.0–46.0)
Hemoglobin: 11.2 g/dL — ABNORMAL LOW (ref 12.0–15.0)
Immature Granulocytes: 0 %
Lymphocytes Relative: 24 %
Lymphs Abs: 1.2 10*3/uL (ref 0.7–4.0)
MCH: 33.6 pg (ref 26.0–34.0)
MCHC: 32.5 g/dL (ref 30.0–36.0)
MCV: 103.6 fL — ABNORMAL HIGH (ref 80.0–100.0)
Monocytes Absolute: 0.6 10*3/uL (ref 0.1–1.0)
Monocytes Relative: 12 %
Neutro Abs: 2.9 10*3/uL (ref 1.7–7.7)
Neutrophils Relative %: 60 %
Platelets: 216 10*3/uL (ref 150–400)
RBC: 3.33 MIL/uL — ABNORMAL LOW (ref 3.87–5.11)
RDW: 14.6 % (ref 11.5–15.5)
WBC: 4.9 10*3/uL (ref 4.0–10.5)
nRBC: 0 % (ref 0.0–0.2)

## 2020-03-03 LAB — BASIC METABOLIC PANEL
Anion gap: 8 (ref 5–15)
BUN: 12 mg/dL (ref 8–23)
CO2: 24 mmol/L (ref 22–32)
Calcium: 8.3 mg/dL — ABNORMAL LOW (ref 8.9–10.3)
Chloride: 105 mmol/L (ref 98–111)
Creatinine, Ser: 0.65 mg/dL (ref 0.44–1.00)
GFR calc Af Amer: >60 mL/min (ref >60)
GFR calc non Af Amer: >60 mL/min (ref >60)
Glucose, Bld: 101 mg/dL — ABNORMAL HIGH (ref 70–99)
Potassium: 4 mmol/L (ref 3.5–5.1)
Sodium: 137 mmol/L (ref 135–145)

## 2020-03-03 LAB — PROCALCITONIN: Procalcitonin: <0.1 ng/mL

## 2020-03-03 LAB — SARS CORONAVIRUS 2 BY RT PCR (HOSPITAL ORDER, PERFORMED IN ~~LOC~~ HOSPITAL LAB): SARS Coronavirus 2: NEGATIVE

## 2020-03-03 MED ORDER — SENNOSIDES-DOCUSATE SODIUM 8.6-50 MG PO TABS
1.0000 | ORAL_TABLET | Freq: Every evening | ORAL | Status: DC | PRN
  Administered 2020-03-09: 1 via ORAL
  Filled 2020-03-03: qty 1

## 2020-03-03 MED ORDER — ROSUVASTATIN CALCIUM 10 MG PO TABS
10.0000 mg | ORAL_TABLET | Freq: Every day | ORAL | Status: DC
  Administered 2020-03-03 – 2020-03-09 (×7): 10 mg via ORAL
  Filled 2020-03-03 (×7): qty 1

## 2020-03-03 MED ORDER — AZITHROMYCIN 250 MG PO TABS
500.0000 mg | ORAL_TABLET | Freq: Once | ORAL | Status: AC
  Administered 2020-03-03: 500 mg via ORAL
  Filled 2020-03-03: qty 2

## 2020-03-03 MED ORDER — ASPIRIN EC 81 MG PO TBEC
81.0000 mg | DELAYED_RELEASE_TABLET | Freq: Every day | ORAL | Status: DC
  Administered 2020-03-04 – 2020-03-10 (×7): 81 mg via ORAL
  Filled 2020-03-03 (×7): qty 1

## 2020-03-03 MED ORDER — FUROSEMIDE 10 MG/ML IJ SOLN
20.0000 mg | Freq: Two times a day (BID) | INTRAMUSCULAR | Status: DC
  Administered 2020-03-03 – 2020-03-06 (×6): 20 mg via INTRAVENOUS
  Filled 2020-03-03 (×6): qty 2

## 2020-03-03 MED ORDER — SODIUM CHLORIDE 0.9 % IV SOLN
2.0000 g | Freq: Two times a day (BID) | INTRAVENOUS | Status: DC
  Administered 2020-03-03 – 2020-03-04 (×2): 2 g via INTRAVENOUS
  Filled 2020-03-03 (×3): qty 2

## 2020-03-03 MED ORDER — IBUPROFEN 200 MG PO TABS: 400.0000 mg | ORAL_TABLET | Freq: Four times a day (QID) | ORAL | Status: DC | PRN

## 2020-03-03 MED ORDER — CARVEDILOL 6.25 MG PO TABS
6.2500 mg | ORAL_TABLET | Freq: Every day | ORAL | Status: DC
  Administered 2020-03-04 – 2020-03-10 (×7): 6.25 mg via ORAL
  Filled 2020-03-03 (×8): qty 1

## 2020-03-03 MED ORDER — VITAMIN D 25 MCG (1000 UNIT) PO TABS
2000.0000 [IU] | ORAL_TABLET | Freq: Every day | ORAL | Status: DC
  Administered 2020-03-04 – 2020-03-10 (×7): 2000 [IU] via ORAL
  Filled 2020-03-03 (×12): qty 2

## 2020-03-03 MED ORDER — SODIUM CHLORIDE 0.9 % IV SOLN
500.0000 mg | INTRAVENOUS | Status: DC
  Administered 2020-03-04: 500 mg via INTRAVENOUS
  Filled 2020-03-03: qty 500

## 2020-03-03 MED ORDER — SALONPAS PAIN RELIEF PATCH EX PTCH: MEDICATED_PATCH | Freq: Every day | CUTANEOUS | Status: DC | PRN

## 2020-03-03 MED ORDER — CLOPIDOGREL BISULFATE 75 MG PO TABS
75.0000 mg | ORAL_TABLET | Freq: Every day | ORAL | Status: DC
  Administered 2020-03-04 – 2020-03-10 (×7): 75 mg via ORAL
  Filled 2020-03-03 (×8): qty 1

## 2020-03-03 MED ORDER — SODIUM CHLORIDE 0.9 % IV SOLN
1.0000 g | Freq: Once | INTRAVENOUS | Status: AC
  Administered 2020-03-03: 1 g via INTRAVENOUS
  Filled 2020-03-03: qty 10

## 2020-03-03 MED ORDER — ONDANSETRON HCL 4 MG/2ML IJ SOLN
4.0000 mg | Freq: Four times a day (QID) | INTRAMUSCULAR | Status: DC | PRN
  Administered 2020-03-10: 4 mg via INTRAVENOUS
  Filled 2020-03-03: qty 2

## 2020-03-03 MED ORDER — POLYETHYL GLYCOL-PROPYL GLYCOL 0.4-0.3 % OP GEL: Freq: Two times a day (BID) | OPHTHALMIC | Status: DC | PRN

## 2020-03-03 MED ORDER — ONDANSETRON HCL 4 MG PO TABS
4.0000 mg | ORAL_TABLET | Freq: Four times a day (QID) | ORAL | Status: DC | PRN
  Administered 2020-03-05: 4 mg via ORAL
  Filled 2020-03-03: qty 1

## 2020-03-03 MED ORDER — KETOTIFEN FUMARATE 0.025 % OP SOLN
1.0000 [drp] | Freq: Two times a day (BID) | OPHTHALMIC | Status: DC
  Administered 2020-03-03 – 2020-03-10 (×14): 1 [drp] via OPHTHALMIC
  Filled 2020-03-03: qty 5

## 2020-03-03 MED ORDER — HEPARIN SODIUM (PORCINE) 5000 UNIT/ML IJ SOLN
5000.0000 [IU] | Freq: Three times a day (TID) | INTRAMUSCULAR | Status: DC
  Administered 2020-03-03 – 2020-03-04 (×4): 5000 [IU] via SUBCUTANEOUS
  Filled 2020-03-03 (×4): qty 1

## 2020-03-03 MED ORDER — POLYVINYL ALCOHOL 1.4 % OP SOLN
1.0000 [drp] | Freq: Two times a day (BID) | OPHTHALMIC | Status: DC | PRN
  Filled 2020-03-03: qty 15

## 2020-03-03 NOTE — ED Triage Notes (Signed)
Pt BIBA from [facility]. Pt baseline oriented to person only. VS within normal limits. Pt's family sent patient to be checked out for a cough w/ green phlegm. Patient has no complaints at this time. No fever. Hx of hypertension

## 2020-03-03 NOTE — ED Provider Notes (Signed)
[facility] DEPT Provider Note   CSN: [id number] Arrival date & time: 03/03/20  0758     History Chief Complaint  Patient presents with  . Cough    Stephanie Cochran is a 86 y.o. female.  HPI   Patient presented to the ED for evaluation of a cough.  Patient is a resident of [facility] facility.  According to the EMS report the family visited her recently and noted that she had a productive cough.  They wanted her to come to the ED to be evaluated.  Patient herself has no complaints.  She is not sure why she is here.  He denies any fevers or chills.  She is not have any chest pain.  No complaints of leg swelling.  Unknown whether she has had her Covid vaccination  Past Medical History:  Diagnosis Date  . Aortic stenosis    a. s/p bioprosthetic AVR 2007 [facility]);  b. Echo (04/2009 - [facility] in [place], [place]):  Mild LVH, normal LVF, anterior WMA, AVR ok (mean 4 mmHg), MAC, mild MR, mild TV stenosis, RVSP 44 mmHg  . Arthritis   . Atrial flutter ([place])    post operative  . Breast cancer ([place])    left mastectomy  . CAD (coronary artery disease)    s/p CABG 2007 at [facility]  . Diverticulosis    history  . HLD (hyperlipidemia)   . HTN (hypertension)   . Hx of echocardiogram    Echo (8/15):  Mild focal basal septal hypertrophy, EF 60-65%, no RWMA, AVR ok (mean 8 mmHg), MAC, mild MS (mean 4 mmHg), mod MR, mild LAE, PASP 54 mmHg  . Mobitz II    a. s/p STJ dual chamber PPM 2015  . Paroxysmal atrial fibrillation (HCC)   . Post-menopausal   . PVD (peripheral vascular disease) [facility])     Patient Active Problem List   Diagnosis Date Noted  . Acute respiratory failure ([place]) 03/03/2020  . AF (atrial fibrillation) ([place]) 12/23/2016  . S/P AVR (aortic valve replacement) 12/23/2016  . FTT (failure to thrive) in adult 12/23/2016  . Near syncope 12/14/2016  . Acute encephalopathy 12/14/2016  . Elevated troponin 12/14/2016  . Anemia 12/14/2016  . Acute  ischemic left MCA stroke ([place])   . Encephalopathy   . CHF (congestive heart failure) ([place]) 12/09/2015  . Risk for falls 12/09/2015  . History of recent fall 12/09/2015  . Complete heart block ([place]) 05/13/2014  . AV block, 2nd degree 05/13/2014  . Atherosclerosis of renal artery ([place]) 01/04/2012  . Lower extremity edema 11/11/2011  . Coronary artery disease 11/11/2011  . History of aortic valve replacement with bioprosthetic valve 11/11/2011  . History of renal artery stenosis 11/11/2011  . Hypertension 11/11/2011  . Hyperlipidemia 11/11/2011    Past Surgical History:  Procedure Laterality Date  . AORTIC VALVE REPLACEMENT  2007   [facility]   . ARTERIOVENOUS GRAFT PLACEMENT W/ ENDOSCOPIC VEIN HARVEST Left    left leg  . BREAST SURGERY    . CORONARY ARTERY BYPASS GRAFT Left 2007   x3, left internal mammary artery to the left anterior descending, saphenous vein graft tot he circumflex coronary  . EYE SURGERY    . HEMORROIDECTOMY    . MASTECTOMY Left 2008   left, breast cancer, followed by a hematology oncologist  . PDoyleN/A 05/14/2014   SJM Assurity DR implanted by Dr ARayann Hemanfor complete heart block  .  RENAL ARTERY STENT  2006, 2010  . TONSILLECTOMY  1943     OB History   No obstetric history on file.     Family History  Problem Relation Age of Onset  . Colon cancer Sister   . Diabetes Unknown   . Heart disease Unknown   . Arthritis Unknown     Social History   Tobacco Use  . Smoking status: Former Smoker    Types: Cigarettes    Quit date: 09/27/1960    Years since quitting: 59.4  . Smokeless tobacco: Never Used  . Tobacco comment: quit smoking 56 years ago  Substance Use Topics  . Alcohol use: No  . Drug use: No    Home Medications Prior to Admission medications   Medication Sig Start Date End Date Taking? Authorizing Provider  acetaminophen (TYLENOL) 650 MG CR tablet Take 1,300 mg by mouth  every 6 (six) hours as needed (back pain). max 6 tabs in 24 hrs   Yes [provider]  aspirin EC 81 MG tablet Take 81 mg by mouth daily.   Yes [provider]  carvedilol (COREG) 6.25 MG tablet Take 1 tablet (6.25 mg total) by mouth daily. 09/03/15  Yes Dorothy Spark, MD  Cholecalciferol (VITAMIN D) 2000 UNITS tablet Take 2,000 Units by mouth daily.   Yes [provider]  clopidogrel (PLAVIX) 75 MG tablet Take 75 mg by mouth daily.   Yes [provider]  ketotifen (ZADITOR) 0.025 % ophthalmic solution Place 1 drop into both eyes 2 (two) times daily.   Yes [provider]  Liniments (SALONPAS PAIN RELIEF PATCH EX) Apply 1 patch topically daily as needed (pain).    Yes [provider]  Nutritional Supplements (ENSURE ENLIVE PO) Take 237 mLs by mouth 2 (two) times daily.   Yes [provider]  Polyethyl Glycol-Propyl Glycol (SYSTANE OP) Place 1 drop into both eyes 2 (two) times daily as needed (dry eyes).   Yes [provider]  rosuvastatin (CRESTOR) 10 MG tablet Take 1 tablet (10 mg total) by mouth daily. Patient taking differently: Take 10 mg by mouth at bedtime.  12/18/16  Yes Hongalgi, Lenis Dickinson, MD  senna (SENOKOT) 8.6 MG tablet Take 2 tablets as directed by mouth. At bedtime as needed for constipation. May have up to 2 times a week   Yes [provider]  traMADol (ULTRAM) 50 MG tablet Take 50 mg by mouth every 12 (twelve) hours as needed (pain).   Yes [provider]  Wheat Dextrin (BENEFIBER) POWD Take 1 Scoop by mouth daily with supper. 1 Tablespoon   Yes [provider]    Allergies    Penicillins, Codeine, and Vicodin [hydrocodone-acetaminophen]  Review of Systems   Review of Systems  All other systems reviewed and are negative.   Physical Exam Updated Vital Signs BP (!) 159/65 (BP Location: Right Arm)   Pulse 70   Temp 97.7 F (36.5 C)   Resp 18   Ht 1.6 m (_0 )   Wt 52 kg    SpO2 97%   BMI 20.31 kg/m   Physical Exam Vitals and nursing note reviewed.  Constitutional:      General: She is not in acute distress.    Appearance: She is well-developed.     Comments: Elderly, frail  HENT:     Head: Normocephalic and atraumatic.     Right Ear: External ear normal.     Left Ear: External ear normal.  Eyes:  General: No scleral icterus.       Right eye: No discharge.        Left eye: No discharge.     Conjunctiva/sclera: Conjunctivae normal.  Neck:     Trachea: No tracheal deviation.  Cardiovascular:     Rate and Rhythm: Normal rate and regular rhythm.  Pulmonary:     Effort: Pulmonary effort is normal. No respiratory distress.     Breath sounds: Normal breath sounds. No stridor. No wheezing or rales.  Abdominal:     General: Bowel sounds are normal. There is no distension.     Palpations: Abdomen is soft.     Tenderness: There is no abdominal tenderness. There is no guarding or rebound.  Musculoskeletal:        General: No tenderness.     Cervical back: Neck supple.  Skin:    General: Skin is warm and dry.     Findings: No rash.  Neurological:     Mental Status: She is alert.     Cranial Nerves: No cranial nerve deficit (no facial droop, extraocular movements intact, no slurred speech).     Sensory: No sensory deficit.     Motor: No abnormal muscle tone or seizure activity.     Coordination: Coordination normal.     ED Results / Procedures / Treatments   Labs (all labs ordered are listed, but only abnormal results are displayed) Labs Reviewed  CBC WITH DIFFERENTIAL/PLATELET - Abnormal; Notable for the following components:      Result Value   RBC 3.33 (*)    Hemoglobin 11.2 (*)    HCT 34.5 (*)    MCV 103.6 (*)    All other components within normal limits  BASIC METABOLIC PANEL - Abnormal; Notable for the following components:   Glucose, Bld 101 (*)    Calcium 8.3 (*)    All other components within normal limits  BRAIN NATRIURETIC  PEPTIDE - Abnormal; Notable for the following components:   B Natriuretic Peptide 171.8 (*)    All other components within normal limits  BASIC METABOLIC PANEL - Abnormal; Notable for the following components:   Calcium 8.4 (*)    All other components within normal limits  CBC - Abnormal; Notable for the following components:   RBC 3.31 (*)    Hemoglobin 11.1 (*)    HCT 33.1 (*)    All other components within normal limits  SARS CORONAVIRUS 2 BY RT PCR [facility] ORDER, [place] LAB)  PROCALCITONIN    EKG EKG Interpretation  Date/Time:  Monday March 03 2020 09:03:10 EDT Ventricular Rate:  70 PR Interval:    QRS Duration: 185 QT Interval:  475 QTC Calculation: 513 R Axis:   -68 Text Interpretation: Ventricular-paced rhythm No further analysis attempted due to paced rhythm No significant change since last tracing Confirmed by Dorie Rank [phone]) on 03/03/2020 11:10:14 AM   Radiology CT Chest Wo Contrast  Result Date: 03/03/2020 CLINICAL DATA:  Per PT chart: Pt BIBA from [facility]. Pt baseline oriented to person only. VS within normal limits. Pt's family sent patient to be checked out for a cough w/ green phlegm. Patient has no complaints at this time. No fever. * EXAM: CT CHEST WITHOUT CONTRAST TECHNIQUE: Multidetector CT imaging of the chest was performed following the standard protocol without IV contrast. COMPARISON:  Current chest radiograph.  Prior chest CTA, 04/23/2014. FINDINGS: Cardiovascular: Heart is normal in size. Previous cardiac surgery and aortic valve replacement. Three-vessel  coronary artery calcifications. Dense mitral valve annular calcifications. No pericardial effusion. Great vessels are normal in caliber. Dense aortic atherosclerotic calcifications. Mediastinum/Nodes: No neck base, mediastinal or hilar masses or enlarged lymph nodes. Trachea and esophagus are unremarkable. Lungs/Pleura: Moderate, layering right pleural effusion. Moderate partly  loculated left pleural effusion, pleural fluid appearing loculated along the posteromedial and lateral left hemithorax. Compressive atelectasis of a portion of the left lower lobe. Mild dependent atelectasis also new left upper lobe and right lower lobe. Bilateral interstitial thickening. Focal area of reticular opacity and ground-glass opacity in the right middle lobe, stable consistent with scarring. Mild areas of ground-glass opacity noted in the lobes. No lung mass or suspicious nodule. No pneumothorax. Upper Abdomen: No acute finding. Musculoskeletal: Chronic appearing compression fracture of T11, new since the prior CT. No convincing acute fracture. No osteoblastic or osteolytic lesions. Degenerative changes noted of the spine. Skeletal structures are diffusely demineralized. IMPRESSION: 1. Moderate bilateral pleural effusions, partly loculated on the left. 2. Dependent/compressive atelectasis most evident in the left lower lobe. No convincing pneumonia. 3. Bilateral interstitial thickening may all be chronic. Mild interstitial edema should be considered in the proper clinical setting. 4. Previous cardiac surgery and aortic valve replacement. Three-vessel coronary artery calcifications and dense aortic atherosclerotic calcifications. 5. Chronic appearing moderate compression fracture of T11. This is new since the prior CTA. Aortic Atherosclerosis (ICD10-I70.0). Electronically Signed   By: Lajean Manes M.D.   On: 03/03/2020 12:18   DG Chest Portable 1 View  Result Date: 03/03/2020 CLINICAL DATA:  One 86 year old female with history of productive cough. EXAM: PORTABLE CHEST 1 VIEW COMPARISON:  Chest x-ray 12/14/2016. FINDINGS: Opacity at the left base which may reflect areas of atelectasis and/or consolidation. Moderate left pleural effusion. Right lung is well aerated. No right pleural effusion. No pneumothorax. No evidence of pulmonary edema. Heart size is normal. Upper mediastinal contours are within  normal limits. Aortic atherosclerosis. Status post median sternotomy for CABG and aortic valve replacement with a stented bioprosthesis. Severe calcifications of the mitral annulus. Left-sided pacemaker device in place with lead tips projecting over the expected location of the right atrium and right ventricle. IMPRESSION: 1. Moderate left pleural effusion with atelectasis and/or consolidation in the left lower lobe. Clinical correlation for signs and symptoms of pneumonia is recommended. 2. Aortic atherosclerosis. Electronically Signed   By: Vinnie Langton M.D.   On: 03/03/2020 09:31    Procedures Procedures (including critical care time)  Medications Ordered in ED Medications  heparin injection 5,000 Units (5,000 Units Subcutaneous Given 03/04/20 0544)  ibuprofen (ADVIL) tablet 400 mg (has no administration in time range)  senna-docusate (Senokot-S) tablet 1 tablet (has no administration in time range)  ondansetron (ZOFRAN) tablet 4 mg (has no administration in time range)    Or  ondansetron (ZOFRAN) injection 4 mg (has no administration in time range)  furosemide (LASIX) injection 20 mg (20 mg Intravenous Given 03/04/20 0338)  azithromycin (ZITHROMAX) 500 mg in sodium chloride 0.9 % 250 mL IVPB (has no administration in time range)  ceFEPIme (MAXIPIME) 2 g in sodium chloride 0.9 % 100 mL IVPB (2 g Intravenous New Bag/Given 03/04/20 0541)  aspirin EC tablet 81 mg (has no administration in time range)  cholecalciferol (VITAMIN D) tablet 2,000 Units (has no administration in time range)  carvedilol (COREG) tablet 6.25 mg (has no administration in time range)  clopidogrel (PLAVIX) tablet 75 mg (has no administration in time range)  rosuvastatin (CRESTOR) tablet 10 mg (10 mg  Oral Given 03/03/20 2228)  ketotifen (ZADITOR) 0.025 % ophthalmic solution 1 drop (1 drop Both Eyes Given 03/03/20 2232)  polyvinyl alcohol (LIQUIFILM TEARS) 1.4 % ophthalmic solution 1 drop (has no administration in time range)    cefTRIAXone (ROCEPHIN) 1 g in sodium chloride 0.9 % 100 mL IVPB (0 g Intravenous Stopped 03/03/20 1242)  azithromycin (ZITHROMAX) tablet 500 mg (500 mg Oral Given 03/03/20 1135)    ED Course  I have reviewed the triage vital signs and the nursing notes.  Pertinent labs & imaging results that were available during my care of the patient were reviewed by me and considered in my medical decision making (see chart for details).  Clinical Course as of Mar 05 711  Mon Mar 03, 2020  1108 Labs reviewed.  Electrolyte panel normal.  BNP slightly elevated at 171.  Covid negative.   [JK]  1109 CBC reviewed.  Anemia stable.  No leukocytosis.   [JK]  1109 DG Chest Portable 1 View [JK]  1109 Chest x-ray suggests possible pneumonia and pleural effusion   [JK]  1118 We will start patient on antibiotics.  Patient has a penicillin allergy.  Indicates nausea vomiting and anaphylaxis however upon review several previous allergy indications just showed nausea vomiting for her penicillin allergy.  Will monitor closely   [JK]    Clinical Course User Index [JK] Dorie Rank, MD   MDM Rules/Calculators/A&P                      Labs without leukocytosis.  BNP not significantly elevated.  Chest x-ray shows pleural effusion and possible pneumonia.  Patient started on antibiotics for possible pneumonia.  Clinical picture not entirely consistent with pneumonia.  Afebrile and no leukocytosis.  Plan on CT of the chest for further evaluation.  We will start the patient empirically on antibiotics.  She remains hemodynamically stable.  No signs of sepsis.  Admit for further evaluation.  Plan was discussed with the patient as well as her daughter who is at the bedside Final Clinical Impression(s) / ED Diagnoses Final diagnoses:  Pneumonia due to infectious organism, unspecified laterality, unspecified part of lung  Pleural effusion      Dorie Rank, MD 03/04/20 [phone]

## 2020-03-03 NOTE — Progress Notes (Signed)
1Pharmacy Antibiotic Note  Stephanie Cochran is a 86 y.o. female admitted on 03/03/2020 with pneumonia.  Pharmacy has been consulted for cefepime dosing. She has already received Rocephin & azithromycin,  WBC 4.9, SCr 0.54. procalcitonin negative.  6/7 chest CT: "no convincing PNA"  Plan: Cefepime 2 gm IV q12 azith 500 IV q24 x 5 days total per MD  Height: 5\' 3"  (160 cm) Weight: 52 kg (114 lb 10.2 oz) IBW/kg (Calculated) : 52.4  Temp (24hrs), Avg:98.4 F (36.9 C), Min:98.2 F (36.8 C), Max:98.5 F (36.9 C)  Recent Labs  Lab 03/03/20 0837  WBC 4.9  CREATININE 0.65    Estimated Creatinine Clearance: 30.7 mL/min (by C-G formula based on SCr of 0.65 mg/dL).    Allergies  Allergen Reactions  . Penicillins Nausea And Vomiting and Anaphylaxis  . Codeine Nausea And Vomiting  . Vicodin [Hydrocodone-Acetaminophen] Nausea And Vomiting    Antimicrobials this admission: 6/7  CTX x 1 6/7 azith >> 6/7 cefepime>> Dose adjustments this admission:  Microbiology results: 6/7 covid neg  Thank you for allowing pharmacy to be a part of this patient's care.  Eudelia Bunch, Pharm.D 03/03/2020 4:12 PM

## 2020-03-03 NOTE — H&P (Signed)
History and Physical    Stephanie Cochran:[id number] DOB: May 28, 1918 DOA: 03/03/2020  PCP: Cari Caraway, MD   Chief Complaint: Cough, shortness of breath  HPI: Stephanie Cochran is a 86 y.o. female with medical history significant of paroxysmal A. fib/flutter, osteoarthritis, obstructive CAD status post CABG 2007, diverticulosis, hypertension, hyperlipidemia, aortic stenosis status post bioprosthetic valve replacement 2007, breast cancer status left mastectomy, peripheral vascular disease.  Patient presents from nursing facility for increasing cough and shortness of breath.  Family became concerned over the weekend that patient was having worsening cough and appeared to be breathing more quickly although patient declines any shortness of breath or dyspnea with exertion or fatigue.  Family declines patient has difficulty swallowing or coughing during or after meals.  Review of systems somewhat limited due to patient's advanced age but overtly denies any nausea, vomiting, diarrhea, constipation, headache, fevers, chills.  ED Course: In ED patient's evaluation was remarkable for left lower lobe opacification on plain film concerning for effusion versus pneumonia, CT chest pending to further evaluate opacification.  Patient has minimally elevated BNP at 171 but other than chronic macrocytic anemia patient's lab work was unrevealing.  Procalcitonin currently pending to rule out whether opacification could be infectious.  Patient's COVID-19 swab is negative.  Review of Systems: As per HPI above, denies nausea, vomiting, diarrhea, constipation, headache, fevers, chills.   Assessment/Plan Active Problems:   Acute respiratory failure (HCC)   Acute respiratory distress without hypoxia secondary to bilateral pleural effusion with left-sided loculation  - Procalcitonin negative; possibly early aspiration, infection not yet disseminated  -Continue antibiotics, diuresis per discussion with PCCM   -Follow  clinically, patient has mild symptoms at this point, would hold off on any aggressive treatment or investigation including but not limited to thoracentesis and discussion with family at bedside today   - We will have patient evaluated formally by speech, appreciate insight and recommendations given high risk for aspiration pneumonia   -Continue incentive spirometry, flutter, nebs and supportive care as necessary  Paroxysmal A. fib/flutter status post pacemaker -Not currently on anticoagulation due to increased fall risk, essentially bedbound now, patient declined drastically during Covid pandemic due to limited staff and inability for family to visit -PT OT to evaluate we appreciate recommendations  Hypertension/CAD -Continue home carvedilol, aspirin, statin, Plavix  Anemia, chronic of chronic disease -Continue to closely, no signs or symptoms of bleeding at this time  Aortic stenosis status post bioprosthetic valve -Continue blood pressure control as above, aspirin, statin, Plavix  History of breast cancer status post treatment and mastectomy -Stable, no acute treatment indicated  DVT prophylaxis: Heparin Code Status: DNR Family Communication: Niece Izora Gala at bedside Status is: Inpatient  Dispo: The patient is from: SNF              Anticipated d/c is to: SNF              Anticipated d/c date is: Likely 31 to 72 hours pending clinical course              Patient currently not medically stable for discharge given profound infiltrate, loculations and effusion on chest CT concerning for infection.  Continue IV diuretics and antibiotics, patient is high risk for decompensation given advanced age and comorbid's as above  Consultants:   None  Procedures:   None planned   Past Medical History:  Diagnosis Date  . Aortic stenosis    a. s/p bioprosthetic AVR 2007 [facility]);  b. Echo (04/2009 - [place]  [facility] in [place], [place]):  Mild LVH, normal LVF, anterior WMA, AVR ok (mean 4 mmHg), MAC,  mild MR, mild TV stenosis, RVSP 44 mmHg  . Arthritis   . Atrial flutter ([place])    post operative  . Breast cancer ([place])    left mastectomy  . CAD (coronary artery disease)    s/p CABG 2007 at [facility]  . Diverticulosis    history  . HLD (hyperlipidemia)   . HTN (hypertension)   . Hx of echocardiogram    Echo (8/15):  Mild focal basal septal hypertrophy, EF 60-65%, no RWMA, AVR ok (mean 8 mmHg), MAC, mild MS (mean 4 mmHg), mod MR, mild LAE, PASP 54 mmHg  . Mobitz II    a. s/p STJ dual chamber PPM 2015  . Paroxysmal atrial fibrillation (HCC)   . Post-menopausal   . PVD (peripheral vascular disease) ([place])     Past Surgical History:  Procedure Laterality Date  . AORTIC VALVE REPLACEMENT  2007   [facility]   . ARTERIOVENOUS GRAFT PLACEMENT W/ ENDOSCOPIC VEIN HARVEST Left    left leg  . BREAST SURGERY    . CORONARY ARTERY BYPASS GRAFT Left 2007   x3, left internal mammary artery to the left anterior descending, saphenous vein graft tot he circumflex coronary  . EYE SURGERY    . HEMORROIDECTOMY    . MASTECTOMY Left 2008   left, breast cancer, followed by a hematology oncologist  . PZortmanN/A 05/14/2014   SJM Assurity DR implanted by Dr ARayann Hemanfor complete heart block  . RENAL ARTERY STENT  2006, 2010  . TONSILLECTOMY  1943     reports that she quit smoking about 59 years ago. Her smoking use included cigarettes. She has never used smokeless tobacco. She reports that she does not drink alcohol or use drugs.  Allergies  Allergen Reactions  . Penicillins Nausea And Vomiting and Anaphylaxis  . Codeine Nausea And Vomiting  . Vicodin [Hydrocodone-Acetaminophen] Nausea And Vomiting    Family History  Problem Relation Age of Onset  . Colon cancer Sister   . Diabetes Unknown   . Heart disease Unknown   . Arthritis Unknown     Prior to Admission medications   Medication Sig Start Date End Date Taking? Authorizing Provider   acetaminophen (TYLENOL) 650 MG CR tablet Take 1,300 mg every 6 (six) hours as needed by mouth for pain (max 6 tabs in 24 hrs).    [provider]  aspirin EC 81 MG tablet Take 81 mg by mouth daily.    [provider]  carvedilol (COREG) 6.25 MG tablet Take 1 tablet (6.25 mg total) by mouth daily. 09/03/15   NDorothy Spark MD  Cholecalciferol (VITAMIN D) 2000 UNITS tablet Take 2,000 Units by mouth daily.    [provider]  clopidogrel (PLAVIX) 75 MG tablet Take 75 mg by mouth daily.    [provider]  Liniments (SALONPAS PAIN RELIEF PATCH EX) Apply 1 patch topically daily as needed.    [provider]  Nutritional Supplements (ENSURE ENLIVE PO) Take 237 mLs by mouth 2 (two) times daily.    [provider]  Polyethyl Glycol-Propyl Glycol (SYSTANE OP) Place 1 drop into both eyes 2 (two) times daily as needed (dry eyes).    [provider]  rosuvastatin (CRESTOR) 10 MG tablet Take 1 tablet (10 mg total) by mouth daily. 12/18/16   [place], ALenis Dickinson MD  senna (Donavan Burnet  8.6 MG tablet Take 2 tablets as directed by mouth. At bedtime as needed for constipation. May have up to 2 times a week    [provider]  vitamin C (ASCORBIC ACID) 500 MG tablet Take 1,000 mg by mouth daily.    [provider]    Physical Exam: Vitals:   03/03/20 0915 03/03/20 0930 03/03/20 1030 03/03/20 1100  BP: 118/74 (!) 119/58 (!) 148/74 123/87  Pulse: 70 70 70 70  Resp: _0 Temp:      TempSrc:      SpO2: 96% 97% 97% 97%  Weight:      Height:        Constitutional: NAD, calm, comfortable Vitals:   03/03/20 0915 03/03/20 0930 03/03/20 1030 03/03/20 1100  BP: 118/74 (!) 119/58 (!) 148/74 123/87  Pulse: 70 70 70 70  Resp: _1 Temp:      TempSrc:      SpO2: 96% 97% 97% 97%  Weight:      Height:       General:  Pleasantly resting in bed, No acute distress. HEENT:  Normocephalic atraumatic.  Sclerae nonicteric,  noninjected.  Extraocular movements intact bilaterally. Neck:  Without mass or deformity.  Trachea is midline. Lungs: Coarse bibasilar breath sounds with marked left side rhonchi no overt wheeze Heart:  Regular rate and rhythm.  Without murmurs, rubs, or gallops. Abdomen:  Soft, nontender, nondistended.  Without guarding or rebound. Extremities: Without cyanosis, clubbing, edema, or obvious deformity. Vascular:  Dorsalis pedis and posterior tibial pulses palpable bilaterally. Skin:  Warm and dry, no erythema, no ulcerations.  Labs on Admission: I have personally reviewed following labs and imaging studies  CBC: Recent Labs  Lab 03/03/20 0837  WBC 4.9  NEUTROABS 2.9  HGB 11.2*  HCT 34.5*  MCV 103.6*  PLT 412   Basic Metabolic Panel: Recent Labs  Lab 03/03/20 0837  NA 137  K 4.0  CL 105  CO2 24  GLUCOSE 101*  BUN 12  CREATININE 0.65  CALCIUM 8.3*   GFR: Estimated Creatinine Clearance: 30.7 mL/min (by C-G formula based on SCr of 0.65 mg/dL). Liver Function Tests: No results for input(s): AST, ALT, ALKPHOS, BILITOT, PROT, ALBUMIN in the last 168 hours. No results for input(s): LIPASE, AMYLASE in the last 168 hours. No results for input(s): AMMONIA in the last 168 hours. Coagulation Profile: No results for input(s): INR, PROTIME in the last 168 hours. Cardiac Enzymes: No results for input(s): CKTOTAL, CKMB, CKMBINDEX, TROPONINI in the last 168 hours. BNP (last 3 results) No results for input(s): PROBNP in the last 8760 hours. HbA1C: No results for input(s): HGBA1C in the last 72 hours. CBG: No results for input(s): GLUCAP in the last 168 hours. Lipid Profile: No results for input(s): CHOL, HDL, LDLCALC, TRIG, CHOLHDL, LDLDIRECT in the last 72 hours. Thyroid Function Tests: No results for input(s): TSH, T4TOTAL, FREET4, T3FREE, THYROIDAB in the last 72 hours. Anemia Panel: No results for input(s): VITAMINB12, FOLATE, FERRITIN, TIBC, IRON, RETICCTPCT in the last 72  hours. Urine analysis:    Component Value Date/Time   COLORURINE STRAW (A) 12/14/2016 1109   APPEARANCEUR CLEAR 12/14/2016 1109   LABSPEC 1.008 12/14/2016 1109   PHURINE 7.0 12/14/2016 1109   GLUCOSEU NEGATIVE 12/14/2016 1109   [place] 12/14/2016 1109   [place] 12/14/2016 1109   KETONESUR NEGATIVE 12/14/2016 1109   PROTEINUR NEGATIVE 12/14/2016 1109   UROBILINOGEN 0.2 05/13/2014 1822   NITRITE NEGATIVE  12/14/2016 1109   [place] 12/14/2016 1109    Radiological Exams on Admission: DG Chest Portable 1 View  Result Date: 03/03/2020 CLINICAL DATA:  One 86 year old female with history of productive cough. EXAM: PORTABLE CHEST 1 VIEW COMPARISON:  Chest x-ray 12/14/2016. FINDINGS: Opacity at the left base which may reflect areas of atelectasis and/or consolidation. Moderate left pleural effusion. Right lung is well aerated. No right pleural effusion. No pneumothorax. No evidence of pulmonary edema. Heart size is normal. Upper mediastinal contours are within normal limits. Aortic atherosclerosis. Status post median sternotomy for CABG and aortic valve replacement with a stented bioprosthesis. Severe calcifications of the mitral annulus. Left-sided pacemaker device in place with lead tips projecting over the expected location of the right atrium and right ventricle. IMPRESSION: 1. Moderate left pleural effusion with atelectasis and/or consolidation in the left lower lobe. Clinical correlation for signs and symptoms of pneumonia is recommended. 2. Aortic atherosclerosis. Electronically Signed   By: Vinnie Langton M.D.   On: 03/03/2020 09:31    EKG: Independently reviewed. V-paced without discernable P waves  Little Ishikawa DO [facility] For contact please use secure messenger on Epic  If 7PM-7AM, please contact night-coverage located on [email or web address]   03/03/2020, 11:57 AM

## 2020-03-03 NOTE — ED Notes (Signed)
Transport called.

## 2020-03-04 ENCOUNTER — Inpatient Hospital Stay (HOSPITAL_COMMUNITY): Payer: Medicare Other

## 2020-03-04 DIAGNOSIS — I361 Nonrheumatic tricuspid (valve) insufficiency: Secondary | ICD-10-CM

## 2020-03-04 DIAGNOSIS — I501 Left ventricular failure: Secondary | ICD-10-CM

## 2020-03-04 DIAGNOSIS — J90 Pleural effusion, not elsewhere classified: Secondary | ICD-10-CM

## 2020-03-04 DIAGNOSIS — I442 Atrioventricular block, complete: Secondary | ICD-10-CM

## 2020-03-04 DIAGNOSIS — I340 Nonrheumatic mitral (valve) insufficiency: Secondary | ICD-10-CM

## 2020-03-04 DIAGNOSIS — I509 Heart failure, unspecified: Secondary | ICD-10-CM

## 2020-03-04 LAB — BASIC METABOLIC PANEL
Anion gap: 11 (ref 5–15)
BUN: 12 mg/dL (ref 8–23)
CO2: 25 mmol/L (ref 22–32)
Calcium: 8.4 mg/dL — ABNORMAL LOW (ref 8.9–10.3)
Chloride: 101 mmol/L (ref 98–111)
Creatinine, Ser: 0.79 mg/dL (ref 0.44–1.00)
GFR calc Af Amer: >60 mL/min (ref >60)
GFR calc non Af Amer: >60 mL/min (ref >60)
Glucose, Bld: 93 mg/dL (ref 70–99)
Potassium: 3.5 mmol/L (ref 3.5–5.1)
Sodium: 137 mmol/L (ref 135–145)

## 2020-03-04 LAB — CBC
HCT: 33.1 % — ABNORMAL LOW (ref 36.0–46.0)
Hemoglobin: 11.1 g/dL — ABNORMAL LOW (ref 12.0–15.0)
MCH: 33.5 pg (ref 26.0–34.0)
MCHC: 33.5 g/dL (ref 30.0–36.0)
MCV: 100 fL (ref 80.0–100.0)
Platelets: 235 10*3/uL (ref 150–400)
RBC: 3.31 MIL/uL — ABNORMAL LOW (ref 3.87–5.11)
RDW: 14.4 % (ref 11.5–15.5)
WBC: 5 10*3/uL (ref 4.0–10.5)
nRBC: 0 % (ref 0.0–0.2)

## 2020-03-04 LAB — ECHOCARDIOGRAM COMPLETE
Height: 63 in
Weight: 1834.23 oz

## 2020-03-04 MED ORDER — ENOXAPARIN SODIUM 30 MG/0.3ML ~~LOC~~ SOLN
30.0000 mg | SUBCUTANEOUS | Status: DC
  Administered 2020-03-04 – 2020-03-09 (×6): 30 mg via SUBCUTANEOUS
  Filled 2020-03-04 (×6): qty 0.3

## 2020-03-04 MED ORDER — POTASSIUM CHLORIDE CRYS ER 20 MEQ PO TBCR
40.0000 meq | EXTENDED_RELEASE_TABLET | Freq: Once | ORAL | Status: AC
  Administered 2020-03-04: 40 meq via ORAL
  Filled 2020-03-04: qty 2

## 2020-03-04 NOTE — Progress Notes (Signed)
PROGRESS NOTE    Stephanie Cochran  VQQ:[id number] DOB: 1917-11-11 DOA: 03/03/2020 PCP: Cari Caraway, MD    Brief Narrative:  Patient admitted to the hospital with working diagnosis of respiratory distress due to bilateral lower effusions.  86 year old female with past medical history for paroxysmal atrial fibrillation/flutter, CAD status post CABG 2007, hypertension, dyslipidemia, arctic stenosis status post bioprosthetic valve replacement 2007, breast cancer status post left mastectomy, peripheral vascular disease and osteoarthritis. Patient was noted to have worsening dyspnea and cough, positive signs of increased work of breathing.  On her initial physical examination blood pressure 118/74, heart rate 70, respiratory rate 18, oxygen saturation 97%.  She had coarse breath sounds bilaterally with decreased breath sounds at the base on the left, heart S1-S2, present rhythmic, soft abdomen, trace lower extremity edema. Sodium 137, potassium 4.0, chloride 105, bicarb 24, glucose 101, BUN 12, creatinine 0.65, pro calcitonin less than 0.10, white count 4.9, hemoglobin 11.2, hematocrit 34.5, platelets 216.  SARS COVID-19 negative.  Chest radiograph with left pleural effusion and hilar vascular congestion, cephalization of the vasculature.  CT chest with bilateral pleural effusions, more left than right, left-sided loculation, faint bilateral groundglass opacities.  Dependent/compressive atelectasis.  EKG 70 bpm,  V paced, 100%, with secondary repolarization changes.   Assessment & Plan:   Principal Problem:   Pulmonary edema, acute, with congestive heart disease (HCC) Active Problems:   Coronary artery disease   History of aortic valve replacement with bioprosthetic valve   Hypertension   Hyperlipidemia   Complete heart block (HCC)   CHF (congestive heart failure) (HCC)   AF (atrial fibrillation) (HCC)   S/P AVR (aortic valve replacement)   1. Acute cardiogenic pulmonary edema with  bilateral pleural effusions, (loculated on the left), related to acute on chronic diastolic heart failure. Patient feeling better this am after diuresis, urine output over since admission 1,550 ml. Her oxymetry is 98% on room air at rest.   Will continue diuresis with furosemide and will follow up on echocardiogram. Left pleural effusion with loculation, not a good candidate for thoracentesis. Continue oxymetry monitoring.   Continue medical therapy with carvedilol  Will add incentive spirometer, and encourage out of bed to chair tid with meals, PT and OT.   No signs of bacterial infection will dc antibiotic therapy for now. Will follow chest film in am after diuresis.   2. Paroxysmal atrial fibrillation/ complete heart block sp pacer. Patient has 100% ventricular paced rhythm. Continue telemetry monitoring.   3. HTN/ dyslipidemia/ CAD. Continue blood pressure monitoring. Continue with carvedilol, aspirin and clopidogrel.   On rosuvastatin.   4. Aortic stenosis sp valve replacement. Will continue with diuresis and follow up on echocardiogram.   5. Hx of breast cancer. Stable.   6. Hypokalemia. Renal function with serum cr at 0,79 with K at 3,5 and serum bicarbonate at 25. Will add 40 meq of Kcl and will follow up on renal panel in am.   Status is: Inpatient  Remains inpatient appropriate because:IV treatments appropriate due to intensity of illness or inability to take PO   Dispo: The patient is from: Home              Anticipated d/c is to: Home              Anticipated d/c date is: 2 days              Patient currently is medically stable to d/c.   DVT prophylaxis: Heparin   Code  Status:   dnr  Family Communication: no family at the bedside       Subjective: Patient confused and disorientated, denies any dyspnea or chest pain, no nausea or vomiting.   Objective: Vitals:   03/03/20 2131 03/04/20 0139 03/04/20 0527 03/04/20 1337  BP: (!) 144/70 138/66 (!) 159/65 (!)  132/58  Pulse: 69 70 70 70  Resp: 18 16 18  (!) 22  Temp: 98.1 F (36.7 C) 97.9 F (36.6 C) 97.7 F (36.5 C) 98.2 F (36.8 C)  TempSrc:    Oral  SpO2: 96% 98% 97% 98%  Weight:      Height:        Intake/Output Summary (Last 24 hours) at 03/04/2020 1617 Last data filed at 03/04/2020 1500 Gross per 24 hour  Intake 396 ml  Output 2350 ml  Net -1954 ml   Filed Weights   03/03/20 0814  Weight: 52 kg    Examination:   General: Not in pain or dyspnea, deconditioned and ill looking appearing  Neurology: Awake and alert, non focal  E ENT: mild pallor, no icterus, oral mucosa moist Cardiovascular: No JVD. S1-S2 present, rhythmic, no gallops, rubs, or murmurs. No lower extremity edema. Pulmonary: positive breath sounds bilaterally, decreased breath sounds at bases.  Gastrointestinal. Abdomen with no organomegaly, non tender, no rebound or guarding Skin. No rashes Musculoskeletal: no joint deformities     Data Reviewed: I have personally reviewed following labs and imaging studies  CBC: Recent Labs  Lab 03/03/20 0837 03/04/20 0455  WBC 4.9 5.0  NEUTROABS 2.9  --   HGB 11.2* 11.1*  HCT 34.5* 33.1*  MCV 103.6* 100.0  PLT 216 767   Basic Metabolic Panel: Recent Labs  Lab 03/03/20 0837 03/04/20 0455  NA 137 137  K 4.0 3.5  CL 105 101  CO2 24 25  GLUCOSE 101* 93  BUN 12 12  CREATININE 0.65 0.79  CALCIUM 8.3* 8.4*   GFR: Estimated Creatinine Clearance: 30.7 mL/min (by C-G formula based on SCr of 0.79 mg/dL). Liver Function Tests: No results for input(s): AST, ALT, ALKPHOS, BILITOT, PROT, ALBUMIN in the last 168 hours. No results for input(s): LIPASE, AMYLASE in the last 168 hours. No results for input(s): AMMONIA in the last 168 hours. Coagulation Profile: No results for input(s): INR, PROTIME in the last 168 hours. Cardiac Enzymes: No results for input(s): CKTOTAL, CKMB, CKMBINDEX, TROPONINI in the last 168 hours. BNP (last 3 results) No results for input(s):  PROBNP in the last 8760 hours. HbA1C: No results for input(s): HGBA1C in the last 72 hours. CBG: No results for input(s): GLUCAP in the last 168 hours. Lipid Profile: No results for input(s): CHOL, HDL, LDLCALC, TRIG, CHOLHDL, LDLDIRECT in the last 72 hours. Thyroid Function Tests: No results for input(s): TSH, T4TOTAL, FREET4, T3FREE, THYROIDAB in the last 72 hours. Anemia Panel: No results for input(s): VITAMINB12, FOLATE, FERRITIN, TIBC, IRON, RETICCTPCT in the last 72 hours.    Radiology Studies: I have reviewed all of the imaging during this hospital visit personally     Scheduled Meds: . aspirin EC  81 mg Oral Daily  . carvedilol  6.25 mg Oral Daily  . cholecalciferol  2,000 Units Oral Daily  . clopidogrel  75 mg Oral Daily  . furosemide  20 mg Intravenous Q12H  . heparin  5,000 Units Subcutaneous Q8H  . ketotifen  1 drop Both Eyes BID  . potassium chloride  40 mEq Oral Once  . rosuvastatin  10 mg Oral QHS  Continuous Infusions:   LOS: 1 day        Zakari Bathe Gerome Apley, MD

## 2020-03-04 NOTE — Progress Notes (Signed)
PT Cancellation Note  Patient Details Name: Stephanie Cochran MRN: [id number] DOB: 01-12-1918   Cancelled Treatment:    Reason Eval/Treat Not Completed: Patient declined, patient states that she just wants to drink her milk, today is not a good day for therapy. Unable to convince patient to particate. Will check back tomorrow.  [name] 03/04/2020, 3:05 PM [place] Pager [phone] Office [phone]

## 2020-03-04 NOTE — Progress Notes (Signed)
OT Cancellation Note  Patient Details Name: Stephanie Cochran MRN: [id number] DOB: July 21, 1918   Cancelled Treatment:    Reason Eval/Treat Not Completed: Other (comment)  Noted pt from ALF- will defer OT eval to [place], [place] [phone] Office- [phone], Edwena Felty D 03/04/2020, 1:38 PM

## 2020-03-04 NOTE — Progress Notes (Signed)
  Echocardiogram 2D Echocardiogram has been performed.  Jennette Dubin 03/04/2020, 3:53 PM

## 2020-03-04 NOTE — Progress Notes (Signed)
PROGRESS NOTE  Stephanie Cochran BLT:[id number] DOB: 04-11-1918 DOA: 03/03/2020 PCP: Cari Caraway, MD   LOS: 1 day   Brief Narrative / Interim history: Stephanie Cochran is a 86 y.o. female with pertinent medical history of paroxysmal A. fib/flutter, osteoarthritis, obstructive CAD status post CABG (2007), diverticulosis, hypertension, hyperlipidemia, aortic stenosis status post bioprosthetic valve replacement (2007), breast cancer - status left mastectomy, peripheral vascular disease who presents from nursing facility for increasing cough and shortness of breath.  Family became concerned over the weekend that patient was having a worsening cough, and appeared to be breathing more quickly, although patient declines any shortness of breath or dyspnea with exertion or fatigue. Family declines patient has difficulty swallowing or coughing during or after meals. Review of systems somewhat limited due to patient's advanced age with dementia, but overtly denies any nausea, vomiting, diarrhea, constipation, headache, fevers, chills. Patient is only alert to self.  In the ED, patient's evaluation was remarkable for left lower lobe opacification on plain film concerning for effusion versus pneumonia. CT chest demonstrating moderate bilateral pleural effusions, partly loculated on the left, with dependent/compressive atelectasis most evident in the left lower lobe, without convincing pneumonia, and with bilateral interstitial thickening. All CT findings may be chronic. Mild  interstitial edema should be considered. Patient has minimally elevated BNP at 171 but other than chronic macrocytic anemia patient's lab work was unrevealing.  Procalcitonin is negative, with opacification less likely to be infectious. Patient's COVID-19 swab is negative.   Subjective / 24h Interval events: Pt was being set up for breakfast in bed with assistance on morning rounds. Patient is very pleasantly demented, and only alert to  self at this time, per baseline. She doe not endorse any pain currently, but stated her right chest hurts sometimes behind her right breast with radiation to the right side. She is unable to appreciate her current status and health concerns. Patient was noted with a wet cough. Will reach out to family.   Assessment & Plan: Principal Problem Acute respiratory distress without hypoxia secondary to bilateral pleural effusion with left-sided loculation: - Procalcitonin <0.10, negative. - Possibly early aspiration, possible infection not yet disseminated if present. - Continue Azithromycin, cefepime, furosemide per physician discussion with PCCM. - Follow clinically: patient has mild symptoms at this point, would hold off on any aggressive treatment or investigation including but not limited to thoracentesis. Will discuss with family as appropriate. - SLP consulted for evaluation of risk for aspiration pneumonia. - Continue incentive spirometry, flutter, nebs and supportive care as necessary.   Active Problems Paroxysmal A. fib/flutter status post pacemaker: - On heparin currently. Patient essentially bedbound now: patient declined drastically during Covid pandemic due to limited staff and inability for family to visit. - Not on anticoagulation at baseline due to previous increased fall risk. - PT, OT consulted.   Hypertension/CAD -Continue home carvedilol, aspirin, statin, Plavix  Anemia, chronic of chronic disease -Continue to closely, no signs or symptoms of bleeding at this time  Aortic stenosis status post bioprosthetic valve -Continue blood pressure control as above, aspirin, statin, Plavix  History of breast cancer status post treatment and mastectomy -Stable, no acute treatment indicated  Scheduled Meds:  aspirin EC  81 mg Oral Daily   carvedilol  6.25 mg Oral Daily   cholecalciferol  2,000 Units Oral Daily   clopidogrel  75 mg Oral Daily   furosemide  20 mg  Intravenous Q12H   heparin  5,000 Units Subcutaneous Q8H   ketotifen  1 drop Both Eyes BID   rosuvastatin  10 mg Oral QHS   Continuous Infusions:  azithromycin 500 mg (03/04/20 1048)   ceFEPime (MAXIPIME) IV 2 g (03/04/20 0541)   PRN Meds:.ibuprofen, ondansetron **OR** ondansetron (ZOFRAN) IV, polyvinyl alcohol, senna-docusate  DVT prophylaxis: Heparin Code Status: DNR Family Communication: No family at bedside on morning rounds.  Status is: Inpatient  Remains inpatient appropriate because:Inpatient level of care appropriate due to severity of illness   Dispo: The patient is from: ALF              Anticipated d/c is to: ALF              Anticipated d/c date is: 2 days              Patient currently is not medically stable to d/c.   Consultants:  None  Procedures:  2D echo: None Foley: None BiPAP: None HD: None  Microbiology  Covid negative.  Antimicrobials: None    Objective: Vitals:   03/03/20 1736 03/03/20 2131 03/04/20 0139 03/04/20 0527  BP: (!) 141/65 (!) 144/70 138/66 (!) 159/65  Pulse: 69 69 70 70  Resp: 18 18 16 18   Temp: 98.1 F (36.7 C) 98.1 F (36.7 C) 97.9 F (36.6 C) 97.7 F (36.5 C)  TempSrc: Oral     SpO2: 98% 96% 98% 97%  Weight:      Height:        Intake/Output Summary (Last 24 hours) at 03/04/2020 1116 Last data filed at 03/04/2020 0600 Gross per 24 hour  Intake 100 ml  Output 1550 ml  Net -1450 ml   Filed Weights   03/03/20 0814  Weight: 52 kg    Examination:  Constitutional: NAD Eyes: no scleral icterus ENMT: Mucous membranes are moist.  Neck: normal, supple Respiratory: decreased breath sounds in left lower lung field with ronchi. No wheezing bilaterally. Normal respiratory effort. No accessory muscle use. Wet cough noted. Cardiovascular: Regular rate and rhythm, no murmurs / rubs / gallops. No LE edema. Good peripheral pulses Abdomen: non distended, no tenderness. Bowel sounds positive.  Musculoskeletal: no  clubbing / cyanosis.  Skin: no rashes Neurologic: grossly non-focal. Psychiatric: Normal judgment and insight. Alert and oriented x 3. Normal mood.    Data Reviewed: I have independently reviewed following labs and imaging studies   CBC: Recent Labs  Lab 03/03/20 0837 03/04/20 0455  WBC 4.9 5.0  NEUTROABS 2.9  --   HGB 11.2* 11.1*  HCT 34.5* 33.1*  MCV 103.6* 100.0  PLT 216 979   Basic Metabolic Panel: Recent Labs  Lab 03/03/20 0837 03/04/20 0455  NA 137 137  K 4.0 3.5  CL 105 101  CO2 24 25  GLUCOSE 101* 93  BUN 12 12  CREATININE 0.65 0.79  CALCIUM 8.3* 8.4*   Liver Function Tests: No results for input(s): AST, ALT, ALKPHOS, BILITOT, PROT, ALBUMIN in the last 168 hours. Coagulation Profile: No results for input(s): INR, PROTIME in the last 168 hours. HbA1C: No results for input(s): HGBA1C in the last 72 hours. CBG: No results for input(s): GLUCAP in the last 168 hours.  Recent Results (from the past 240 hour(s))  SARS Coronavirus 2 by RT PCR (hospital order, performed in [facility] hospital lab) Nasopharyngeal Nasopharyngeal Swab     Status: None   Collection Time: 03/03/20  8:38 AM   Specimen: Nasopharyngeal Swab  Result Value Ref Range Status   SARS Coronavirus 2 NEGATIVE NEGATIVE Final  Comment: (NOTE) SARS-CoV-2 target nucleic acids are NOT DETECTED. The SARS-CoV-2 RNA is generally detectable in upper and lower respiratory specimens during the acute phase of infection. The lowest concentration of SARS-CoV-2 viral copies this assay can detect is 250 copies / mL. A negative result does not preclude SARS-CoV-2 infection and should not be used as the sole basis for treatment or other patient management decisions.  A negative result may occur with improper specimen collection / handling, submission of specimen other than nasopharyngeal swab, presence of viral mutation(s) within the areas targeted by this assay, and inadequate number of viral copies (<250  copies / mL). A negative result must be combined with clinical observations, patient history, and epidemiological information. Fact Sheet for Patients:   StrictlyIdeas.no Fact Sheet for Healthcare Providers: BankingDealers.co.za This test is not yet approved or cleared  by the [place] FDA and has been authorized for detection and/or diagnosis of SARS-CoV-2 by FDA under an Emergency Use Authorization (EUA).  This EUA will remain in effect (meaning this test can be used) for the duration of the COVID-19 declaration under Section 564(b)(1) of the Act, 21 U.S.C. section 360bbb-3(b)(1), unless the authorization is terminated or revoked sooner. Performed at [facility], [place] [address] [place], [place] [address]      Radiology Studies: CT Chest Wo Contrast  Result Date: 03/03/2020 CLINICAL DATA:  Per PT chart: Pt BIBA from [facility]. Pt baseline oriented to person only. VS within normal limits. Pt's family sent patient to be checked out for a cough w/ green phlegm. Patient has no complaints at this time. No fever. * EXAM: CT CHEST WITHOUT CONTRAST TECHNIQUE: Multidetector CT imaging of the chest was performed following the standard protocol without IV contrast. COMPARISON:  Current chest radiograph.  Prior chest CTA, 04/23/2014. FINDINGS: Cardiovascular: Heart is normal in size. Previous cardiac surgery and aortic valve replacement. Three-vessel coronary artery calcifications. Dense mitral valve annular calcifications. No pericardial effusion. Great vessels are normal in caliber. Dense aortic atherosclerotic calcifications. Mediastinum/Nodes: No neck base, mediastinal or hilar masses or enlarged lymph nodes. Trachea and esophagus are unremarkable. Lungs/Pleura: Moderate, layering right pleural effusion. Moderate partly loculated left pleural effusion, pleural fluid appearing loculated along the posteromedial and lateral left  hemithorax. Compressive atelectasis of a portion of the left lower lobe. Mild dependent atelectasis also new left upper lobe and right lower lobe. Bilateral interstitial thickening. Focal area of reticular opacity and ground-glass opacity in the right middle lobe, stable consistent with scarring. Mild areas of ground-glass opacity noted in the lobes. No lung mass or suspicious nodule. No pneumothorax. Upper Abdomen: No acute finding. Musculoskeletal: Chronic appearing compression fracture of T11, new since the prior CT. No convincing acute fracture. No osteoblastic or osteolytic lesions. Degenerative changes noted of the spine. Skeletal structures are diffusely demineralized. IMPRESSION: 1. Moderate bilateral pleural effusions, partly loculated on the left. 2. Dependent/compressive atelectasis most evident in the left lower lobe. No convincing pneumonia. 3. Bilateral interstitial thickening may all be chronic. Mild interstitial edema should be considered in the proper clinical setting. 4. Previous cardiac surgery and aortic valve replacement. Three-vessel coronary artery calcifications and dense aortic atherosclerotic calcifications. 5. Chronic appearing moderate compression fracture of T11. This is new since the prior CTA. Aortic Atherosclerosis (ICD10-I70.0). Electronically Signed   By: Lajean Manes M.D.   On: 03/03/2020 12:18

## 2020-03-04 NOTE — Evaluation (Addendum)
SLP Cancellation Note  Patient Details Name: DELMA DRONE MRN: [id number] DOB: 1918-08-09   Cancelled treatment:       Reason Eval/Treat Not Completed: Other (comment)(pt receiving care from nursing staff, curtain drawn and staff with her, will continue efforts)   Macario Golds 03/04/2020, 1:41 PM  Kathleen Lime, MS [place] Office [phone]

## 2020-03-05 ENCOUNTER — Inpatient Hospital Stay (HOSPITAL_COMMUNITY): Payer: Medicare Other

## 2020-03-05 NOTE — TOC Initial Note (Signed)
Transition of Care (TOC) - Initial/Assessment Note  ° ° °Patient Details  °Name: Stephanie Cochran °MRN: [id number] °Date of Birth: 12/03/1917 ° °Transition of Care (TOC) CM/SW Contact:    ° B , LCSW °Phone Number: °03/05/2020, 2:33 PM ° °Clinical Narrative:    Met with patient, niece at bedside in follow up to PT recommendation.  Ms Jurek is pleasantly confused; she tells me about her home on [address] and is unsure of the date or where she currently is.  Niece is hopeful she can return to [facility] since she has been there several years and is in familiar surroundings there.  Spoke with Wendy, charge nurse at [facility] who reviewed the PT note and stated that they would be able to take patient back there at d/c.  They will need an FL2 and paperwork re: COVID filled out the day of d/c. TOC will continue to follow during the course of hospitalization. °             ° ° °Expected Discharge Plan: Assisted Living °Barriers to Discharge: No Barriers Identified ° ° °Patient Goals and CMS Choice °Patient states their goals for this hospitalization and ongoing recovery are:: Unable to say °  °  ° °Expected Discharge Plan and Services °Expected Discharge Plan: Assisted Living °  °Discharge Planning Services: CM Consult °Post Acute Care Choice: Resumption of Svcs/PTA Provider, Home Health °Living arrangements for the past 2 months: Assisted Living Facility °                °  °  °  °  °  °  °  °  °  °  ° °Prior Living Arrangements/Services °Living arrangements for the past 2 months: Assisted Living Facility °Lives with:: Facility Resident °Patient language and need for interpreter reviewed:: Yes °Do you feel safe going back to the place where you live?: Yes      °Need for Family Participation in Patient Care: Yes (Comment) °Care giver support system in place?: Yes (comment) °Current home services: DME °Criminal Activity/Legal Involvement Pertinent to Current Situation/Hospitalization: No - Comment as  needed ° °Activities of Daily Living °Home Assistive Devices/Equipment: Blood pressure cuff, Eyeglasses, Hospital bed, Grab bars around toilet, Grab bars in shower, Hand-held shower hose, Wheelchair, Scales °ADL Screening (condition at time of admission) °Patient's cognitive ability adequate to safely complete daily activities?: No °Is the patient deaf or have difficulty hearing?: Yes °Does the patient have difficulty seeing, even when wearing glasses/contacts?: No °Does the patient have difficulty concentrating, remembering, or making decisions?: Yes °Patient able to express need for assistance with ADLs?: Yes °Does the patient have difficulty dressing or bathing?: Yes °Independently performs ADLs?: No °Communication: Independent °Dressing (OT): Needs assistance °Is this a change from baseline?: Pre-admission baseline °Grooming: Needs assistance °Is this a change from baseline?: Pre-admission baseline °Feeding: Needs assistance °Is this a change from baseline?: Pre-admission baseline °Bathing: Needs assistance °Is this a change from baseline?: Pre-admission baseline °Toileting: Dependent °Is this a change from baseline?: Pre-admission baseline °In/Out Bed: Dependent °Is this a change from baseline?: Pre-admission baseline °Walks in Home: Dependent(patient wheelchair bound) °Is this a change from baseline?: Pre-admission baseline °Does the patient have difficulty walking or climbing stairs?: Yes(secondary to weakness) °Weakness of Legs: Both °Weakness of Arms/Hands: None ° °Permission Sought/Granted °Permission sought to share information with : Family Supports °Permission granted to share information with : Yes, Verbal Permission Granted ° Share Information with NAME: Neice, daughter °   °   °   ° °  Emotional Assessment °Appearance:: Appears stated age °Attitude/Demeanor/Rapport: Engaged °Affect (typically observed): Appropriate °Orientation: : Oriented to Self °Alcohol / Substance Use: Not Applicable °Psych  Involvement: No (comment) ° °Admission diagnosis:  Acute respiratory failure (HCC) [J96.00] °Patient Active Problem List  ° Diagnosis Date Noted  °• Pulmonary edema, acute, with congestive heart disease (HCC) 03/04/2020  °• Pleural effusion   °• Acute respiratory failure (HCC) 03/03/2020  °• AF (atrial fibrillation) (HCC) 12/23/2016  °• S/P AVR (aortic valve replacement) 12/23/2016  °• FTT (failure to thrive) in adult 12/23/2016  °• Near syncope 12/14/2016  °• Acute encephalopathy 12/14/2016  °• Elevated troponin 12/14/2016  °• Anemia 12/14/2016  °• Acute ischemic left MCA stroke (HCC)   °• Encephalopathy   °• CHF (congestive heart failure) (HCC) 12/09/2015  °• Risk for falls 12/09/2015  °• History of recent fall 12/09/2015  °• Complete heart block (HCC) 05/13/2014  °• AV block, 2nd degree 05/13/2014  °• Atherosclerosis of renal artery (HCC) 01/04/2012  °• Lower extremity edema 11/11/2011  °• Coronary artery disease 11/11/2011  °• History of aortic valve replacement with bioprosthetic valve 11/11/2011  °• History of renal artery stenosis 11/11/2011  °• Hypertension 11/11/2011  °• Hyperlipidemia 11/11/2011  ° °PCP:  McNeill, Wendy, MD °Pharmacy:   °[id number] - [place] - [address] °[address] °[place] [address] °Phone: [phone] Fax: [phone] ° °[facility] - [place], [place] - [address] AT [facility] °[address] °[place] [address] °Phone: [phone] Fax: [phone] ° ° ° ° °Social Determinants of Health (SDOH) Interventions °  ° °Readmission Risk Interventions °No flowsheet data found. ° °

## 2020-03-05 NOTE — Evaluation (Signed)
Occupational Therapy Evaluation Patient Details Name: Stephanie Cochran MRN: [id number] DOB: 1918/09/15 Today's Date: 03/05/2020    History of Present Illness 86 year old female with past medical history for paroxysmal atrial fibrillation/flutter, CAD status post CABG 2007, hypertension, dyslipidemia, arctic stenosis status post bioprosthetic valve replacement 2007, breast cancer status post left mastectomy, peripheral vascular disease and osteoarthritis, admitted 03/03/20 with increased WOB/SOB.CT chest with bilateral pleural effusions, more left than right, left-sided loculation, faint bilateral groundglass opacities.  Dependent/compressive atelectasis   Clinical Impression   Stephanie Cochran is a 66 year old woman admitted to the hospital with respiratory distress due to bilateral lower effusions. On evaluation patient presents with generalized weakness, confusion, decreased activity tolerance and impaired balance resulting in impaired ability to perform baseline transfers and participation in ADLs. Per report patient typically requiring only one person assist with transfers and ADLs and at this time requiring assistance of two secondary to impaired balance and increased confusion. Patient will benefit from skilled OT services to improve deficits, transfers and ADLs in order to return to ALF and reduce caregiver burden. Would recommend return to familiar environment with [facility] therapy.    Follow Up Recommendations  ALF w/ HH preferred.   Equipment Recommendations       Recommendations for Other Services       Precautions / Restrictions Precautions Precautions: Fall Restrictions Weight Bearing Restrictions: No      Mobility Bed Mobility Overal bed mobility: Needs Assistance Bed Mobility: Supine to Sit;Sit to Supine     Supine to sit: Mod assist Sit to supine: Mod assist   General bed mobility comments: patient did move legs to edge of bed, mod assist for trunk, tending to lean  posteriorly. asssited with legs and trunk to return to supine  Transfers Overall transfer level: Needs assistance   Transfers: Sit to/from Stand;Stand Pivot Transfers Sit to Stand: +2 physical assistance;+2 safety/equipment;Max assist Stand pivot transfers: +2 physical assistance;+2 safety/equipment;Max assist       General transfer comment: max assist to pivot to [facility] and back to bed, required support of feet to prevent feet sliding forward,    Balance Overall balance assessment: Needs assistance Sitting-balance support: Bilateral upper extremity supported;Feet supported Sitting balance-Leahy Scale: Fair Sitting balance - Comments: does support self sitting Postural control: Posterior lean Standing balance support: Bilateral upper extremity supported Standing balance-Leahy Scale: Poor                             ADL either performed or assessed with clinical judgement   ADL Overall ADL's : Needs assistance/impaired Eating/Feeding: Set up   Grooming: Set up;Cueing for sequencing;Supervision/safety   Upper Body Bathing: Minimal assistance;Sitting;Set up;Cueing for sequencing   Lower Body Bathing: Sit to/from stand;+2 for safety/equipment;+2 for physical assistance;Cueing for sequencing;Moderate assistance   Upper Body Dressing : Minimal assistance;Sitting;Cueing for sequencing   Lower Body Dressing: +2 for physical assistance;Maximal assistance;Sit to/from stand   Toilet Transfer: +2 for physical assistance;+2 for safety/equipment;BSC;Stand-pivot;Cueing for sequencing;Cueing for safety   Toileting- Clothing Manipulation and Hygiene: +2 for physical assistance;Maximal assistance;+2 for safety/equipment;Sit to/from stand;Cueing for safety;Cueing for sequencing     Tub/Shower Transfer Details (indicate cue type and reason): n/a Functional mobility during ADLs: +2 for physical assistance;+2 for safety/equipment;Moderate assistance       Vision   Vision  Assessment?: No apparent visual deficits     Perception     Praxis      Pertinent Vitals/Pain Pain Assessment:  No/denies pain     Hand Dominance Right   Extremity/Trunk Assessment Upper Extremity Assessment Upper Extremity Assessment: Generalized weakness   Lower Extremity Assessment Lower Extremity Assessment: Defer to PT evaluation   Cervical / Trunk Assessment Cervical / Trunk Assessment: Kyphotic   Communication Communication Communication: No difficulties   Cognition Arousal/Alertness: Awake/alert Behavior During Therapy: WFL for tasks assessed/performed Overall Cognitive Status: History of cognitive impairments - at baseline                                 General Comments: patient able to follow simple directions. Repeating that she does not know why she is here   General Comments       Exercises     Shoulder Instructions      Home Living Family/patient expects to be discharged to:: Assisted living                                 Additional Comments: per MSW- facility reports 1 assist for transfers and non ambulatory      Prior Functioning/Environment Level of Independence: Needs assistance  Gait / Transfers Assistance Needed: nonambulatory by report ADL's / Homemaking Assistance Needed: assisted by staff for transfers            OT Problem List: Decreased strength;Decreased activity tolerance;Impaired balance (sitting and/or standing);Decreased cognition;Decreased safety awareness;Decreased knowledge of precautions;Decreased knowledge of use of DME or AE      OT Treatment/Interventions: Self-care/ADL training;Therapeutic exercise;Therapeutic activities;Patient/family education;DME and/or AE instruction    OT Goals(Current goals can be found in the care plan section) Acute Rehab OT Goals Patient Stated Goal: agreed to get to [facility] OT Goal Formulation: Patient unable to participate in goal setting Time For Goal  Achievement: 03/19/20 Potential to Achieve Goals: Fair  OT Frequency: Min 2X/week   Barriers to D/C:            Co-evaluation PT/OT/SLP Co-Evaluation/Treatment: Yes Reason for Co-Treatment: For patient/therapist safety;To address functional/ADL transfers PT goals addressed during session: Mobility/safety with mobility OT goals addressed during session: ADL's and self-care      AM-PAC OT "6 Clicks" Daily Activity     Outcome Measure Help from another person eating meals?: A Little Help from another person taking care of personal grooming?: A Little Help from another person toileting, which includes using toliet, bedpan, or urinal?: Total Help from another person bathing (including washing, rinsing, drying)?: A Lot Help from another person to put on and taking off regular upper body clothing?: A Little Help from another person to put on and taking off regular lower body clothing?: Total 6 Click Score: 13   End of Session Equipment Utilized During Treatment: Gait belt Nurse Communication: Mobility status  Activity Tolerance: Patient tolerated treatment well Patient left: in bed;with call bell/phone within reach;with bed alarm set  OT Visit Diagnosis: Unsteadiness on feet (R26.81);Other abnormalities of gait and mobility (R26.89);Muscle weakness (generalized) (M62.81)                Time: 0109-3235 OT Time Calculation (min): 17 min Charges:  OT General Charges $OT Visit: 1 Visit OT Evaluation $OT Eval Low Complexity: 1 Low  Ellan Tess, OTR/L [place]  Office [phone]   Lenward Chancellor 03/05/2020, 12:05 PM

## 2020-03-05 NOTE — Progress Notes (Signed)
PROGRESS NOTE    Stephanie Cochran    Code Status: DNR  IRW:[id number] DOB: Sep 25, 1918 DOA: 03/03/2020 LOS: 2 days  PCP: Cari Caraway, MD CC:  Chief Complaint  Patient presents with  . Cough       Hospital Summary   This is a 86 year old female with history of paroxysmal atrial fibrillation/flutter, CAD status post CABG, hypertension, hyperlipidemia, aortic stenosis s/p bioprosthetic valve replacement, breast cancer s/p left mastectomy, PVD, OA who was noted to have worsening dyspnea and cough with increased work of breathing and coarse breath sounds bilaterally on presentation.  In the PY:PPJKDT 137, potassium 4.0, chloride 105, bicarb 24, glucose 101, BUN 12, creatinine 0.65, pro calcitonin less than 0.10, white count 4.9, hemoglobin 11.2, hematocrit 34.5, platelets 216.  SARS COVID-19 negative.  Chest radiograph with left pleural effusion and hilar vascular congestion, cephalization of the vasculature.  CT chest with bilateral pleural effusions, more left than right, left-sided loculation, faint bilateral groundglass opacities.  Dependent/compressive atelectasis.  EKG 70 bpm,  V paced, 100%, with secondary repolarization changes.      A & P   Principal Problem:   Pulmonary edema, acute, with congestive heart disease (HCC) Active Problems:   Coronary artery disease   History of aortic valve replacement with bioprosthetic valve   Hypertension   Hyperlipidemia   Complete heart block (HCC)   CHF (congestive heart failure) (HCC)   AF (atrial fibrillation) (HCC)   S/P AVR (aortic valve replacement)   Pleural effusion   1. Dyspnea secondary to atelectasis and acute cardiogenic pulmonary edema and left loculated pleural effusion secondary to acute on chronic diastolic heart failure exacerbation, not in respiratory failure a. Tolerating room air, BNP 171 on admission minimally elevated b. EF 60 to 65% with moderate LVH and moderately reduced RV systolic function, severe mitral  calcification and mild MR, mild to moderate TR c. Diuresing well with Lasix 20 mg IV twice daily d. Persistent left loculated pleural effusion however not a good candidate for thoracentesis e. Continue IV diuresis for now and can probably discharge on p.o. Lasix once ready for DC f. Daily weight and I/O g. Incentive spirometer  2. Paroxysmal atrial fibrillation with CHB s/p pacer, stable  3. RUQ pain of unknown significance a. RUQ ultrasound unremarkable  4. Hypertension/hyperlipidemia/CAD a. Continue Coreg, aspirin, Plavix, statin  5. AS s/p valve replacement a. Continue diuresis  6. History of breast cancer, stable  7. Hypokalemia, resolved -100 on Lasix  DVT prophylaxis: Lovenox Family Communication: Patient's family at bedside has been updated  Disposition Plan:  Status is: Inpatient  Remains inpatient appropriate because:IV treatments appropriate due to intensity of illness or inability to take PO   Dispo: The patient is from: ALF              Anticipated d/c is to: ALF              Anticipated d/c date is: 2 days              Patient currently is not medically stable to d/c.          Pressure injury documentation    None  Consultants  None  Procedures  None  Antibiotics   Anti-infectives (From admission, onward)   Start     Dose/Rate Route Frequency Ordered Stop   03/04/20 1100  azithromycin (ZITHROMAX) 500 mg in sodium chloride 0.9 % 250 mL IVPB  Status:  Discontinued     500 mg 250 mL/hr over  60 Minutes Intravenous Every 24 hours 03/03/20 1602 03/04/20 1430   03/03/20 1800  ceFEPIme (MAXIPIME) 2 g in sodium chloride 0.9 % 100 mL IVPB  Status:  Discontinued     2 g 200 mL/hr over 30 Minutes Intravenous Every 12 hours 03/03/20 1614 03/04/20 1430   03/03/20 1130  cefTRIAXone (ROCEPHIN) 1 g in sodium chloride 0.9 % 100 mL IVPB     1 g 200 mL/hr over 30 Minutes Intravenous  Once 03/03/20 1118 03/03/20 1242   03/03/20 1130  azithromycin (ZITHROMAX)  tablet 500 mg     500 mg Oral  Once 03/03/20 1118 03/03/20 1135        Subjective   Patient seen and examined at bedside in no acute distress and resting comfortably. No acute events overnight. Denies any acute complaints at this time.  Tolerating diet well.   Objective   Vitals:   03/04/20 1337 03/04/20 2010 03/05/20 0438 03/05/20 1418  BP: (!) 132/58 (!) 133/54 (!) 145/71 137/61  Pulse: 70 70 70 70  Resp: (!) 22 17 19 18   Temp: 98.2 F (36.8 C) 98.1 F (36.7 C) 98 F (36.7 C) 97.7 F (36.5 C)  TempSrc: Oral   Oral  SpO2: 98% 97% 97% 98%  Weight:      Height:        Intake/Output Summary (Last 24 hours) at 03/05/2020 1641 Last data filed at 03/05/2020 0930 Gross per 24 hour  Intake 120 ml  Output 1500 ml  Net -1380 ml   Filed Weights   03/03/20 0814  Weight: 52 kg    Examination:  Physical Exam Vitals and nursing note reviewed. Exam conducted with a chaperone present.  Constitutional:      Appearance: Normal appearance.  HENT:     Head: Normocephalic and atraumatic.  Eyes:     Conjunctiva/sclera: Conjunctivae normal.  Cardiovascular:     Rate and Rhythm: Normal rate and regular rhythm.  Pulmonary:     Effort: Pulmonary effort is normal.     Breath sounds: Rales present.  Abdominal:     General: Abdomen is flat.     Palpations: Abdomen is soft.  Musculoskeletal:        General: No swelling or tenderness.  Skin:    Coloration: Skin is not jaundiced or pale.  Neurological:     Mental Status: She is alert. Mental status is at baseline.  Psychiatric:        Mood and Affect: Mood normal.        Behavior: Behavior normal.     Data Reviewed: I have personally reviewed following labs and imaging studies  CBC: Recent Labs  Lab 03/03/20 0837 03/04/20 0455  WBC 4.9 5.0  NEUTROABS 2.9  --   HGB 11.2* 11.1*  HCT 34.5* 33.1*  MCV 103.6* 100.0  PLT 216 563   Basic Metabolic Panel: Recent Labs  Lab 03/03/20 0837 03/04/20 0455  NA 137 137  K 4.0  3.5  CL 105 101  CO2 24 25  GLUCOSE 101* 93  BUN 12 12  CREATININE 0.65 0.79  CALCIUM 8.3* 8.4*   GFR: Estimated Creatinine Clearance: 30.7 mL/min (by C-G formula based on SCr of 0.79 mg/dL). Liver Function Tests: No results for input(s): AST, ALT, ALKPHOS, BILITOT, PROT, ALBUMIN in the last 168 hours. No results for input(s): LIPASE, AMYLASE in the last 168 hours. No results for input(s): AMMONIA in the last 168 hours. Coagulation Profile: No results for input(s): INR, PROTIME in the last  168 hours. Cardiac Enzymes: No results for input(s): CKTOTAL, CKMB, CKMBINDEX, TROPONINI in the last 168 hours. BNP (last 3 results) No results for input(s): PROBNP in the last 8760 hours. HbA1C: No results for input(s): HGBA1C in the last 72 hours. CBG: No results for input(s): GLUCAP in the last 168 hours. Lipid Profile: No results for input(s): CHOL, HDL, LDLCALC, TRIG, CHOLHDL, LDLDIRECT in the last 72 hours. Thyroid Function Tests: No results for input(s): TSH, T4TOTAL, FREET4, T3FREE, THYROIDAB in the last 72 hours. Anemia Panel: No results for input(s): VITAMINB12, FOLATE, FERRITIN, TIBC, IRON, RETICCTPCT in the last 72 hours. Sepsis Labs: Recent Labs  Lab 03/03/20 0837  PROCALCITON <0.10    Recent Results (from the past 240 hour(s))  SARS Coronavirus 2 by RT PCR (hospital order, performed in [facility] hospital lab) Nasopharyngeal Nasopharyngeal Swab     Status: None   Collection Time: 03/03/20  8:38 AM   Specimen: Nasopharyngeal Swab  Result Value Ref Range Status   SARS Coronavirus 2 NEGATIVE NEGATIVE Final    Comment: (NOTE) SARS-CoV-2 target nucleic acids are NOT DETECTED. The SARS-CoV-2 RNA is generally detectable in upper and lower respiratory specimens during the acute phase of infection. The lowest concentration of SARS-CoV-2 viral copies this assay can detect is 250 copies / mL. A negative result does not preclude SARS-CoV-2 infection and should not be used as the  sole basis for treatment or other patient management decisions.  A negative result may occur with improper specimen collection / handling, submission of specimen other than nasopharyngeal swab, presence of viral mutation(s) within the areas targeted by this assay, and inadequate number of viral copies (<250 copies / mL). A negative result must be combined with clinical observations, patient history, and epidemiological information. Fact Sheet for Patients:   StrictlyIdeas.no Fact Sheet for Healthcare Providers: BankingDealers.co.za This test is not yet approved or cleared  by the [place] FDA and has been authorized for detection and/or diagnosis of SARS-CoV-2 by FDA under an Emergency Use Authorization (EUA).  This EUA will remain in effect (meaning this test can be used) for the duration of the COVID-19 declaration under Section 564(b)(1) of the Act, 21 U.S.C. section 360bbb-3(b)(1), unless the authorization is terminated or revoked sooner. Performed at [facility], [place] [address] [place], [place] [address]          Radiology Studies: DG Chest 1 View  Result Date: 03/05/2020 CLINICAL DATA:  Dyspnea EXAM: CHEST  1 VIEW COMPARISON:  Radiograph 03/03/2020, CT 03/03/2020 FINDINGS: Persistent loculated left pleural effusion with adjacent passive atelectatic changes in the lungs. Some streaky and bandlike opacities in the right lung base likely reflect atelectatic changes. No right effusion. No pneumothorax. Cardiomediastinal contours as visualized are unchanged from prior with a calcified aorta and prior bioprosthetic aortic valve replacement. Pacer pack overlies the left chest wall with leads in stable position towards the right atrium and cardiac apex. Dense mitral annular calcification. Postsurgical changes related to prior CABG including intact and aligned sternotomy wires and multiple surgical clips projecting over  the mediastinum. No acute osseous or soft tissue abnormality. Degenerative changes are present in the imaged spine and shoulders. Marked elevation of the bilateral humeri relative to the glenoid fossae with acromial undersurface remodeling, likely chronic rotator cuff insufficiency. IMPRESSION: 1. Persistent loculated left pleural effusion with adjacent passive atelectatic changes in the lungs. Underlying consolidation/infection difficult to exclude. 2. Atelectatic changes in the right lung base. 3. Severe degenerative changes in the shoulders with features  suggesting rotator cuff insufficiency. 4. Prior CABG and aortic valve replacement. Dense mitral annular calcification, can be seen with mitral insufficiency. Electronically Signed   By: Lovena Le M.D.   On: 03/05/2020 06:23   ECHOCARDIOGRAM COMPLETE  Result Date: 03/04/2020    ECHOCARDIOGRAM REPORT   Patient Name:   ZAYNE MAROVICH Date of Exam: 03/04/2020 Medical Rec #:  [id number]       Height:       63.0 in Accession #:    [id number]      Weight:       114.6 lb Date of Birth:  08/20/1918       BSA:          1.526 m Patient Age:    89 years       BP:           132/58 mmHg Patient Gender: F               HR:           71 bpm. Exam Location:  Inpatient Procedure: 2D Echo Indications:    dyspnea R06.00  History:        Patient has prior history of Echocardiogram examinations, most                 recent 12/15/2016. CAD, Prior CABG and Pacemaker,                 Arrythmias:Atrial Fibrillation; Risk Factors:Hypertension and                 Dyslipidemia.                 Aortic Valve: 21 mm bioprosthetic valve is present in the aortic                 position. Procedure Date: 2007.  Sonographer:    Mikki Santee RDCS (AE) Referring Phys: [id number] [place]  1. Left ventricular ejection fraction, by estimation, is 60 to 65%. The left ventricle has normal function. The left ventricle has no regional wall motion abnormalities. There is  moderate left ventricular hypertrophy. Left ventricular diastolic parameters are indeterminate.  2. Right ventricular systolic function is moderately reduced. The right ventricular size is mildly enlarged. There is normal pulmonary artery systolic pressure. The estimated right ventricular systolic pressure is 78.9 mmHg.  3. Left atrial size was mildly dilated.  4. The mitral valve is degenerative. Severe mitral annular calcification. Mild mitral valve regurgitation.  5. Tricuspid valve regurgitation is mild to moderate.  6. The aortic valve has been repaired/replaced. Aortic valve regurgitation is trivial. No aortic stenosis is present. There is a 21 mm bioprosthetic valve present in the aortic position. Echo findings are consistent with normal structure and function of  the aortic valve prosthesis.  7. The inferior vena cava is normal in size with greater than 50% respiratory variability, suggesting right atrial pressure of 3 mmHg. FINDINGS  Left Ventricle: Left ventricular ejection fraction, by estimation, is 60 to 65%. The left ventricle has normal function. The left ventricle has no regional wall motion abnormalities. The left ventricular internal cavity size was small. There is moderate  left ventricular hypertrophy. Left ventricular diastolic parameters are indeterminate. Right Ventricle: The right ventricular size is mildly enlarged. Right vetricular wall thickness was not assessed. Right ventricular systolic function is moderately reduced. There is normal pulmonary artery systolic pressure. The tricuspid regurgitant velocity is 2.40 m/s, and with an assumed right atrial pressure  of 3 mmHg, the estimated right ventricular systolic pressure is 33.8 mmHg. Left Atrium: Left atrial size was mildly dilated. Right Atrium: Right atrial size was normal in size. Pericardium: Trivial pericardial effusion is present. Mitral Valve: The mitral valve is degenerative in appearance. Severe mitral annular calcification. Mild  mitral valve regurgitation. Tricuspid Valve: The tricuspid valve is normal in structure. Tricuspid valve regurgitation is mild to moderate. Aortic Valve: The aortic valve has been repaired/replaced. Aortic valve regurgitation is trivial. No aortic stenosis is present. Aortic valve mean gradient measures 2.0 mmHg. Aortic valve peak gradient measures 3.7 mmHg. There is a 21 mm bioprosthetic valve present in the aortic position. Procedure Date: 2007. Echo findings are consistent with normal structure and function of the aortic valve prosthesis. Pulmonic Valve: The pulmonic valve was not well visualized. Pulmonic valve regurgitation is trivial. Aorta: The aortic root is normal in size and structure. Venous: The inferior vena cava is normal in size with greater than 50% respiratory variability, suggesting right atrial pressure of 3 mmHg. IAS/Shunts: The interatrial septum was not well visualized.  LEFT VENTRICLE PLAX 2D LVIDd:         2.90 cm Diastology LVIDs:         2.10 cm LV e' lateral:   4.54 cm/s LV PW:         1.10 cm LV E/e' lateral: 25.8 LV IVS:        1.30 cm  RIGHT VENTRICLE RV S prime:     5.76 cm/s TAPSE (M-mode): 0.8 cm LEFT ATRIUM             Index       RIGHT ATRIUM           Index LA diam:        4.10 cm 2.69 cm/m  RA Area:     12.60 cm LA Vol (A2C):   66.5 ml 43.57 ml/m RA Volume:   27.30 ml  17.89 ml/m LA Vol (A4C):   44.8 ml 29.35 ml/m LA Biplane Vol: 55.7 ml 36.49 ml/m  AORTIC VALVE AV Vmax:           96.60 cm/s AV Vmean:          65.100 cm/s AV VTI:            0.186 m AV Peak Grad:      3.7 mmHg AV Mean Grad:      2.0 mmHg LVOT Vmax:         73.20 cm/s LVOT Vmean:        55.500 cm/s LVOT VTI:          0.148 m LVOT/AV VTI ratio: 0.80  AORTA Ao Root diam: 2.70 cm MITRAL VALVE                TRICUSPID VALVE MV Area (PHT): 2.84 cm     TR Peak grad:   23.0 mmHg MV Decel Time: 267 msec     TR Vmax:        240.00 cm/s MV E velocity: 117.00 cm/s MV A velocity: 46.30 cm/s   SHUNTS MV E/A ratio:  2.53          Systemic VTI: 0.15 m Oswaldo Milian MD Electronically signed by Oswaldo Milian MD Signature Date/Time: 03/04/2020/7:55:16 PM    Final    US Abdomen Limited RUQ  Result Date: 03/05/2020 CLINICAL DATA:  Right upper quadrant pain. EXAM: ULTRASOUND ABDOMEN LIMITED RIGHT UPPER QUADRANT COMPARISON:  Included portion from chest CT 03/03/2020  FINDINGS: Gallbladder: Physiologically distended. No gallstones or wall thickening visualized. No sonographic Murphy sign noted by sonographer. Common bile duct: Diameter: 4 mm, normal. Liver: No focal lesion identified. Within normal limits in parenchymal echogenicity. Portal vein is patent on color Doppler imaging with normal direction of blood flow towards the liver. Other: Right pleural effusion, as seen on recent chest CT. IMPRESSION: 1. Unremarkable sonographic appearance of the gallbladder, liver, and biliary tree. 2. Right pleural effusion as seen on recent chest imaging. Electronically Signed   By: Keith Rake M.D.   On: 03/05/2020 14:20        Scheduled Meds: . aspirin EC  81 mg Oral Daily  . carvedilol  6.25 mg Oral Daily  . cholecalciferol  2,000 Units Oral Daily  . clopidogrel  75 mg Oral Daily  . enoxaparin (LOVENOX) injection  30 mg Subcutaneous Q24H  . furosemide  20 mg Intravenous Q12H  . ketotifen  1 drop Both Eyes BID  . rosuvastatin  10 mg Oral QHS   Continuous Infusions:   Time spent: 30 minutes with over 50% of the time coordinating the patient's care    Harold Hedge, DO [facility] Pager [phone]  Call night coverage person covering after 7pm

## 2020-03-05 NOTE — Evaluation (Signed)
SLP Cancellation Note  Patient Details Name: Stephanie Cochran MRN: [id number] DOB: 1917-10-29   Cancelled treatment:       Reason Eval/Treat Not Completed: (pt getting ready to undergo ECHO, SLP assisted echo staff to confirm name and BD) Will continue efforts.  Kathleen Lime, MS [facility] SLP Acute Rehab Services Office [phone]   Macario Golds 03/05/2020, 12:16 PM

## 2020-03-05 NOTE — Evaluation (Signed)
Physical Therapy Evaluation Patient Details Name: Stephanie Cochran MRN: [id number] DOB: [birth year] Today's Date: 03/05/2020   History of Present Illness  86 year old female with past medical history for paroxysmal atrial fibrillation/flutter, CAD status post CABG 2007, hypertension, dyslipidemia, arctic stenosis status post bioprosthetic valve replacement 2007, breast cancer status post left mastectomy, peripheral vascular disease and osteoarthritis, admitted 03/03/20 with increased WOB/SOB.CT chest with bilateral pleural effusions, more left than right, left-sided loculation, faint bilateral groundglass opacities.  Dependent/compressive atelectasis  Clinical Impression  The patient participated in sitting and  transferring to [facility], then back to bed. The patient has posterior lean tendencies and feet sliding forward, support provided at the feet. . The patient resides in ALF, by report, nonambulatory, 1 assist for transfers. Recommend return to familiar environment/ALF. Pt admitted with above diagnosis.  Pt currently with functional limitations due to the deficits listed below (see PT Problem List). Pt will benefit from skilled PT to increase their independence and safety with mobility to allow discharge to the venue listed below.       Follow Up Recommendations Home health PT(return to ALF)    Equipment Recommendations  None recommended by PT    Recommendations for Other Services       Precautions / Restrictions Precautions Precautions: Fall Restrictions Weight Bearing Restrictions: No      Mobility  Bed Mobility Overal bed mobility: Needs Assistance Bed Mobility: Supine to Sit;Sit to Supine     Supine to sit: Mod assist Sit to supine: Mod assist   General bed mobility comments: patient did move legs to edge of bed, mod assist for trunk, tending to lean posteriorly. asssited with legs and trunk to return to supine  Transfers Overall transfer level: Needs assistance   Transfers:  Sit to/from Stand;Stand Pivot Transfers Sit to Stand: +2 physical assistance;+2 safety/equipment;Max assist Stand pivot transfers: +2 physical assistance;+2 safety/equipment;Max assist       General transfer comment: max assist to pivot to [facility] and back to bed, required support of feet to prevent feet sliding forward,  Ambulation/Gait             General Gait Details: NT  Stairs            Wheelchair Mobility    Modified Rankin (Stroke Patients Only)       Balance Overall balance assessment: Needs assistance Sitting-balance support: Bilateral upper extremity supported;Feet supported Sitting balance-Leahy Scale: Fair Sitting balance - Comments: does support self sitting Postural control: Posterior lean                                   Pertinent Vitals/Pain Pain Assessment: No/denies pain    Home Living Family/patient expects to be discharged to:: Assisted living                 Additional Comments: per MSW- facility reports 1 assist for transfers and non ambulatory    Prior Function Level of Independence: Needs assistance   Gait / Transfers Assistance Needed: nonambulatory by report  ADL's / Homemaking Assistance Needed: assisted by staff for transfers        Hand Dominance        Extremity/Trunk Assessment        Lower Extremity Assessment Lower Extremity Assessment: Generalized weakness(tendency to retropuse / extend legs with standing attmepts)    Cervical / Trunk Assessment Cervical / Trunk Assessment: Kyphotic  Communication      Cognition  Arousal/Alertness: Awake/alert Behavior During Therapy: WFL for tasks assessed/performed Overall Cognitive Status: History of cognitive impairments - at baseline                                 General Comments: patient able to follow simple directions. Repeating that she does not know why she is here      General Comments      Exercises      Assessment/Plan    PT Assessment Patient needs continued PT services  PT Problem List Decreased strength;Decreased mobility;Decreased cognition;Decreased activity tolerance;Decreased safety awareness       PT Treatment Interventions Therapeutic activities;Therapeutic exercise;Patient/family education;Functional mobility training    PT Goals (Current goals can be found in the Care Plan section)  Acute Rehab PT Goals Patient Stated Goal: agreed to get to [facility] PT Goal Formulation: Patient unable to participate in goal setting Time For Goal Achievement: 03/19/20 Potential to Achieve Goals: Fair    Frequency Min 2X/week   Barriers to discharge        Co-evaluation PT/OT/SLP Co-Evaluation/Treatment: Yes Reason for Co-Treatment: For patient/therapist safety;To address functional/ADL transfers PT goals addressed during session: Mobility/safety with mobility OT goals addressed during session: ADL's and self-care       AM-PAC PT "6 Clicks" Mobility  Outcome Measure Help needed turning from your back to your side while in a flat bed without using bedrails?: A Lot Help needed moving from lying on your back to sitting on the side of a flat bed without using bedrails?: A Lot Help needed moving to and from a bed to a chair (including a wheelchair)?: A Lot Help needed standing up from a chair using your arms (e.g., wheelchair or bedside chair)?: A Lot Help needed to walk in hospital room?: Total Help needed climbing 3-5 steps with a railing? : Total 6 Click Score: 10    End of Session Equipment Utilized During Treatment: Gait belt Activity Tolerance: Patient tolerated treatment well Patient left: in bed;with bed alarm set;with call bell/phone within reach Nurse Communication: Mobility status PT Visit Diagnosis: Muscle weakness (generalized) (M62.81)    Time: 6967-8938 PT Time Calculation (min) (ACUTE ONLY): 22 min   Charges:   PT Evaluation $PT Eval Low Complexity: [place] Pager [phone] Office [phone]   [name] 03/05/2020, 11:10 AM

## 2020-03-06 LAB — CBC
HCT: 34.3 % — ABNORMAL LOW (ref 36.0–46.0)
Hemoglobin: 11.5 g/dL — ABNORMAL LOW (ref 12.0–15.0)
MCH: 33.7 pg (ref 26.0–34.0)
MCHC: 33.5 g/dL (ref 30.0–36.0)
MCV: 100.6 fL — ABNORMAL HIGH (ref 80.0–100.0)
Platelets: 233 10*3/uL (ref 150–400)
RBC: 3.41 MIL/uL — ABNORMAL LOW (ref 3.87–5.11)
RDW: 14.6 % (ref 11.5–15.5)
WBC: 4.7 10*3/uL (ref 4.0–10.5)
nRBC: 0 % (ref 0.0–0.2)

## 2020-03-06 LAB — BASIC METABOLIC PANEL
Anion gap: 10 (ref 5–15)
BUN: 16 mg/dL (ref 8–23)
CO2: 28 mmol/L (ref 22–32)
Calcium: 8.4 mg/dL — ABNORMAL LOW (ref 8.9–10.3)
Chloride: 97 mmol/L — ABNORMAL LOW (ref 98–111)
Creatinine, Ser: 0.76 mg/dL (ref 0.44–1.00)
GFR calc Af Amer: >60 mL/min (ref >60)
GFR calc non Af Amer: >60 mL/min (ref >60)
Glucose, Bld: 90 mg/dL (ref 70–99)
Potassium: 3.5 mmol/L (ref 3.5–5.1)
Sodium: 135 mmol/L (ref 135–145)

## 2020-03-06 LAB — MAGNESIUM: Magnesium: 1.9 mg/dL (ref 1.7–2.4)

## 2020-03-06 MED ORDER — FUROSEMIDE 20 MG PO TABS: 20.0000 mg | ORAL_TABLET | Freq: Every day | ORAL | Status: DC

## 2020-03-06 NOTE — Consult Note (Signed)
The asked if I had time to visit with this pt. The pt was alert and aware daughter and son in law at bedside. The pt began to talk after I introduced myself as the chaplain.  She when back to her childhood and remember about her father and wondered why he didn't go to church much. But there was one time that stood out in her mind when her father sat right next to her in church. I offered caring and supportive presence and listening ear. The daughter said that her mother use to sing in the choir. I started singing [place] and the pt sang with me all three verses. They asked If I would come back, I will try to follow-up with them tomorrow.

## 2020-03-06 NOTE — Evaluation (Addendum)
Clinical/Bedside Swallow Evaluation Patient Details  Name: Stephanie Cochran MRN: [id number] Date of Birth: 08-Apr-1918  Today's Date: 03/06/2020 Time: SLP Start Time (ACUTE ONLY): 0901 SLP Stop Time (ACUTE ONLY): 0930 SLP Time Calculation (min) (ACUTE ONLY): 29 min  Past Medical History:  Past Medical History:  Diagnosis Date  . Aortic stenosis    a. s/p bioprosthetic AVR 2007 [facility]);  b. Echo (04/2009 - [facility] in [place], [place]):  Mild LVH, normal LVF, anterior WMA, AVR ok (mean 4 mmHg), MAC, mild MR, mild TV stenosis, RVSP 44 mmHg  . Arthritis   . Atrial flutter ([place])    post operative  . Breast cancer ([place])    left mastectomy  . CAD (coronary artery disease)    s/p CABG 2007 at [facility]  . Diverticulosis    history  . HLD (hyperlipidemia)   . HTN (hypertension)   . Hx of echocardiogram    Echo (8/15):  Mild focal basal septal hypertrophy, EF 60-65%, no RWMA, AVR ok (mean 8 mmHg), MAC, mild MS (mean 4 mmHg), mod MR, mild LAE, PASP 54 mmHg  . Mobitz II    a. s/p STJ dual chamber PPM 2015  . Paroxysmal atrial fibrillation (HCC)   . Post-menopausal   . PVD (peripheral vascular disease) ([place])    Past Surgical History:  Past Surgical History:  Procedure Laterality Date  . AORTIC VALVE REPLACEMENT  2007   [facility]   . ARTERIOVENOUS GRAFT PLACEMENT W/ ENDOSCOPIC VEIN HARVEST Left    left leg  . BREAST SURGERY    . CORONARY ARTERY BYPASS GRAFT Left 2007   x3, left internal mammary artery to the left anterior descending, saphenous vein graft tot he circumflex coronary  . EYE SURGERY    . HEMORROIDECTOMY    . MASTECTOMY Left 2008   left, breast cancer, followed by a hematology oncologist  . PWoodwardN/A 05/14/2014   SJM Assurity DR implanted by Dr ARayann Hemanfor complete heart block  . RENAL ARTERY STENT  2006, 2010  . TONSILLECTOMY  1943   HPI:  [facility] 86[facility] small  bilateral effusion with large left sided loculations concerning for infection.  Pt PMH + for anemia, falls, MCA CVA 2018, encephalopathy, FTT,near syncope.  CT chest 03/03/2020 showed moderate bilateral pleural effusions, partialy loculated on the left.  Dependent compressive on the right .  H/o emphysema per CT chest in 2015.  Pt has been undergoing testing or with staff every time SLP has attempted to conduct evaluation. Daughter SClarise Cruzis present who is her HCPOA.  Swallow eval ordered.   Assessment / Plan / Recommendation Clinical Impression  Pt with decreased participation as she verbalized that she was "tired" and demonstrated delayed processing of information following one step directions approx 60% of opportunities.  She was quite pleasant and sang during session x2.    No focal cranial nerve deficits apparent but pt is generally very weak.    Pt consumed only 1 straw bolus of coffee, 2 straw boluses of water and a few very small bites of crackers.  Clinically pt presents with delayed swallow and overt cough with 1/3 liquid boluses.  Fortunately cough was productive to viscous during session to viscous light green tinged secretions.  SLP advised pt refrain from extending chin upward and swallowing = Using strategy no indication of aspiration 1/1 bolus noted- however functionally SLP is certain pt  is experiecing some low grade aspiration.   Daughter Stephanie Cochran present and advises that pt does not eat well and admits pt reports some lower abdominal cramping at times.  Stephanie Cochran also advises pt has difficulty swallowing pills, coughs with liquids prior to admission and oral holding observed during session is normal for pt "for years" .  Pt's oral holding with intake appears to be largely cognitive based but suspect some sensory deficits present also.  Pt states nothing tastes good and she is just tired.   Pt also states  "I'm going to see Jesus soon and I'm fine with that" - daughter Stephanie Cochran was present for this  conversation and SLP relayed information to RN.    Advised daughter and pt to aspiration/dysphagia mitigation strategies including gustatory changes with advanced aging, recommendation for pill consumption and using puree to clear solid particulates in lieu of water due to decreased velocity will likely decrease aspiration episodes.  and signed off.    Would recommend to consider a palliative referral given pt's premorbid poor intake that continues in-house.     SLP Visit Diagnosis: Dysphagia, oropharyngeal phase (R13.12)    Aspiration Risk  Moderate aspiration risk    Diet Recommendation Regular;Thin liquid (prefer water with meals)   Liquid Administration via: Straw (per pt request) Medication Administration: Whole meds with puree (start and follow with liquids) Supervision: Patient able to self feed Compensations: Slow rate;Small sips/bites;Other (Comment) (head neutral) Postural Changes: Remain upright for at least 30 minutes after po intake;Seated upright at 90 degrees    Other  Recommendations Oral Care Recommendations: Oral care QID   Follow up Recommendations None (all education completed)      Frequency and Duration     n/a       Prognosis   n/a     Swallow Study   General Date of Onset: 03/06/20 HPI: Pt is a 86 yo female adm [facility] with acute respiratory failure with small bilateral effusion with large left sided loculations concerning for infection.  Pt PMH + for anemia, falls, MCA CVA 2018, encephalopathy, FTT,near syncope.  CT chest 03/03/2020 showed moderate bilateral pleural effusions, partialy loculated on the left.  Dependent compressive on the right .  H/o emphysema per CT chest in 2015.  Pt has been undergoing testing or with staff every time SLP has attempted to conduct evaluation. Daughter Stephanie Cochran is present who is her HCPOA.  Swallow eval ordered. Type of Study: Bedside Swallow Evaluation Diet Prior to this Study: Regular;Thin liquids Temperature Spikes Noted:  No Respiratory Status: Room air History of Recent Intubation: No Behavior/Cognition: Alert;Cooperative;Pleasant mood Oral Cavity Assessment: Within Functional Limits Oral Care Completed by SLP: No Oral Cavity - Dentition: Adequate natural dentition Vision: Functional for self-feeding Self-Feeding Abilities: Able to feed self Patient Positioning: Upright in bed Baseline Vocal Quality: Normal Volitional Cough: Strong Volitional Swallow: Able to elicit    Oral/Motor/Sensory Function Overall Oral Motor/Sensory Function: Generalized oral weakness (no focal CN deficits but pt is generally weak)   Ice Chips Ice chips: Not tested   Thin Liquid Thin Liquid: Impaired Presentation: Self Fed;Straw Oral Phase Impairments: Reduced lingual movement/coordination Oral Phase Functional Implications: Oral holding Pharyngeal  Phase Impairments: Cough - Immediate Other Comments: pt prefers using straws to prevent spillage, she only accepted minimal intake stating she is "not hungry" and "everything is ok", cough x1/3 boluses with straw and head elevated; head neutral or minimally lowered prevents indication of aspiration    Nectar Thick Nectar Thick Liquid: Not tested  Honey Thick Honey Thick Liquid: Not tested   Puree Puree: Not tested Other Comments: pt declined to consume   Solid     Solid: Impaired Presentation: Self Fed Oral Phase Impairments: Impaired mastication Oral Phase Functional Implications: Prolonged oral transit Other Comments: pt took very small bites only demonstrating vertical mastication pattern, no oral retention noted after she consumed only 1/8 of a cracker      Macario Golds 03/06/2020,10:02 AM  Kathleen Lime, MS [place] Office [phone]

## 2020-03-06 NOTE — Care Management Important Message (Signed)
Important Message  Patient Details IM Letter given to Roque Lias SW Case Manager to present to the Patient Name: Stephanie Cochran MRN: [id number] Date of Birth: 09/25/1918   Medicare Important Message Given:  Yes     Kerin Salen 03/06/2020, 12:30 PM

## 2020-03-06 NOTE — Progress Notes (Signed)
PROGRESS NOTE    Stephanie Cochran    Code Status: DNR  DJS:[id number] DOB: July 31, 1918 DOA: 03/03/2020 LOS: 3 days  PCP: Cari Caraway, MD CC:  Chief Complaint  Patient presents with  . Cough       Hospital Summary   This is a 86 year old female with history of paroxysmal atrial fibrillation/flutter, CAD status post CABG, hypertension, hyperlipidemia, aortic stenosis s/p bioprosthetic valve replacement, breast cancer s/p left mastectomy, PVD, OA who was noted to have worsening dyspnea and cough with increased work of breathing and coarse breath sounds bilaterally on presentation.  In the YI:FOYDXA 137, potassium 4.0, chloride 105, bicarb 24, glucose 101, BUN 12, creatinine 0.65, pro calcitonin less than 0.10, white count 4.9, hemoglobin 11.2, hematocrit 34.5, platelets 216.  SARS COVID-19 negative.  Chest radiograph with left pleural effusion and hilar vascular congestion, cephalization of the vasculature.  CT chest with bilateral pleural effusions, more left than right, left-sided loculation, faint bilateral groundglass opacities.  Dependent/compressive atelectasis.  EKG 70 bpm,  V paced, 100%, with secondary repolarization changes.      A & P   Principal Problem:   Pulmonary edema, acute, with congestive heart disease (HCC) Active Problems:   Coronary artery disease   History of aortic valve replacement with bioprosthetic valve   Hypertension   Hyperlipidemia   Complete heart block (HCC)   CHF (congestive heart failure) (HCC)   AF (atrial fibrillation) (HCC)   S/P AVR (aortic valve replacement)   Pleural effusion   1. Dyspnea secondary to atelectasis and acute cardiogenic pulmonary edema and left loculated pleural effusion secondary to acute on chronic diastolic heart failure exacerbation, not in respiratory failure a. Tolerating room air, BNP 171 on admission minimally elevated b. EF 60 to 65% with moderate LVH and moderately reduced RV systolic function, severe mitral  calcification and mild MR, mild to moderate TR c. Diuresed well with Lasix 20 mg IV twice daily, currently down about 7 kg and -4 L d. Persistent left loculated pleural effusion however not a good candidate for thoracentesis e. Changed to Lasix IV daily and likely change to oral tomorrow pending chest x-ray results in a.m. f. Daily weight and I/O g. Incentive spirometer h. Family requesting palliative care consult to discuss overall prognosis and management once she is discharged.  2. Paroxysmal atrial fibrillation with CHB s/p pacer, stable  3. RUQ pain of unknown significance a. RUQ ultrasound unremarkable  4. Hypertension/hyperlipidemia/CAD a. Continue Coreg, aspirin, Plavix, statin  5. AS s/p valve replacement a. Continue diuresis  6. History of breast cancer, stable  7. Hypokalemia, resolved  DVT prophylaxis: Lovenox Family Communication: Patient's family at bedside has been updated  Disposition Plan:  Status is: Inpatient  Remains inpatient appropriate because:IV treatments appropriate due to intensity of illness or inability to take PO   Dispo: The patient is from: ALF              Anticipated d/c is to: ALF              Anticipated d/c date is: 1 day              Patient currently is not medically stable to d/c.          Pressure injury documentation    None  Consultants  Palliative care  Procedures  None  Antibiotics   Anti-infectives (From admission, onward)   Start     Dose/Rate Route Frequency Ordered Stop   03/04/20 1100  azithromycin (ZITHROMAX)  500 mg in sodium chloride 0.9 % 250 mL IVPB  Status:  Discontinued        500 mg 250 mL/hr over 60 Minutes Intravenous Every 24 hours 03/03/20 1602 03/04/20 1430   03/03/20 1800  ceFEPIme (MAXIPIME) 2 g in sodium chloride 0.9 % 100 mL IVPB  Status:  Discontinued        2 g 200 mL/hr over 30 Minutes Intravenous Every 12 hours 03/03/20 1614 03/04/20 1430   03/03/20 1130  cefTRIAXone (ROCEPHIN) 1 g in  sodium chloride 0.9 % 100 mL IVPB        1 g 200 mL/hr over 30 Minutes Intravenous  Once 03/03/20 1118 03/03/20 1242   03/03/20 1130  azithromycin (ZITHROMAX) tablet 500 mg        500 mg Oral  Once 03/03/20 1118 03/03/20 1135        Subjective   Patient resting comfortably and sleeping.  Patient's family is at bedside.  States that the patient has been sleeping a lot throughout the past 24 hours.  Niece is tearful today and concerned about her overall prognosis.  States that the patient used to be very lively until [employer] pandemic quarantining (was not infected with Covid) and has now been less like herself.  Would like to discuss with palliative care  Objective   Vitals:   03/05/20 1418 03/05/20 2000 03/06/20 0439 03/06/20 1341  BP: 137/61 (!) 124/58 (!) 105/51 119/66  Pulse: 70 69 70 70  Resp: 18 20 18 14   Temp: 97.7 F (36.5 C) 98.3 F (36.8 C) 98.4 F (36.9 C) (!) 97.2 F (36.2 C)  TempSrc: Oral   Oral  SpO2: 98% 99% 96% 97%  Weight:   45.3 kg   Height:        Intake/Output Summary (Last 24 hours) at 03/06/2020 1639 Last data filed at 03/06/2020 0530 Gross per 24 hour  Intake 60 ml  Output 950 ml  Net -890 ml   Filed Weights   03/03/20 0814 03/06/20 0439  Weight: 52 kg 45.3 kg    Examination:  Physical Exam Vitals and nursing note reviewed. Exam conducted with a chaperone present.  Constitutional:      General: She is not in acute distress.    Appearance: She is not toxic-appearing.  HENT:     Head: Normocephalic.  Eyes:     Conjunctiva/sclera: Conjunctivae normal.  Cardiovascular:     Rate and Rhythm: Normal rate and regular rhythm.  Pulmonary:     Effort: Pulmonary effort is normal.     Breath sounds: Normal breath sounds.  Abdominal:     General: Bowel sounds are normal.  Musculoskeletal:        General: No swelling or tenderness.  Neurological:     Mental Status: Mental status is at baseline.  Psychiatric:        Mood and Affect: Mood normal.      Data Reviewed: I have personally reviewed following labs and imaging studies  CBC: Recent Labs  Lab 03/03/20 0837 03/04/20 0455 03/06/20 0409  WBC 4.9 5.0 4.7  NEUTROABS 2.9  --   --   HGB 11.2* 11.1* 11.5*  HCT 34.5* 33.1* 34.3*  MCV 103.6* 100.0 100.6*  PLT 216 235 778   Basic Metabolic Panel: Recent Labs  Lab 03/03/20 0837 03/04/20 0455 03/06/20 0409  NA 137 137 135  K 4.0 3.5 3.5  CL 105 101 97*  CO2 24 25 28   GLUCOSE 101* 93 90  BUN  12 12 16   CREATININE 0.65 0.79 0.76  CALCIUM 8.3* 8.4* 8.4*  MG  --   --  1.9   GFR: Estimated Creatinine Clearance: 26.7 mL/min (by C-G formula based on SCr of 0.76 mg/dL). Liver Function Tests: No results for input(s): AST, ALT, ALKPHOS, BILITOT, PROT, ALBUMIN in the last 168 hours. No results for input(s): LIPASE, AMYLASE in the last 168 hours. No results for input(s): AMMONIA in the last 168 hours. Coagulation Profile: No results for input(s): INR, PROTIME in the last 168 hours. Cardiac Enzymes: No results for input(s): CKTOTAL, CKMB, CKMBINDEX, TROPONINI in the last 168 hours. BNP (last 3 results) No results for input(s): PROBNP in the last 8760 hours. HbA1C: No results for input(s): HGBA1C in the last 72 hours. CBG: No results for input(s): GLUCAP in the last 168 hours. Lipid Profile: No results for input(s): CHOL, HDL, LDLCALC, TRIG, CHOLHDL, LDLDIRECT in the last 72 hours. Thyroid Function Tests: No results for input(s): TSH, T4TOTAL, FREET4, T3FREE, THYROIDAB in the last 72 hours. Anemia Panel: No results for input(s): VITAMINB12, FOLATE, FERRITIN, TIBC, IRON, RETICCTPCT in the last 72 hours. Sepsis Labs: Recent Labs  Lab 03/03/20 0837  PROCALCITON <0.10    Recent Results (from the past 240 hour(s))  SARS Coronavirus 2 by RT PCR (hospital order, performed in [facility] hospital lab) Nasopharyngeal Nasopharyngeal Swab     Status: None   Collection Time: 03/03/20  8:38 AM   Specimen: Nasopharyngeal Swab   Result Value Ref Range Status   SARS Coronavirus 2 NEGATIVE NEGATIVE Final    Comment: (NOTE) SARS-CoV-2 target nucleic acids are NOT DETECTED. The SARS-CoV-2 RNA is generally detectable in upper and lower respiratory specimens during the acute phase of infection. The lowest concentration of SARS-CoV-2 viral copies this assay can detect is 250 copies / mL. A negative result does not preclude SARS-CoV-2 infection and should not be used as the sole basis for treatment or other patient management decisions.  A negative result may occur with improper specimen collection / handling, submission of specimen other than nasopharyngeal swab, presence of viral mutation(s) within the areas targeted by this assay, and inadequate number of viral copies (<250 copies / mL). A negative result must be combined with clinical observations, patient history, and epidemiological information. Fact Sheet for Patients:   StrictlyIdeas.no Fact Sheet for Healthcare Providers: BankingDealers.co.za This test is not yet approved or cleared  by the [place] FDA and has been authorized for detection and/or diagnosis of SARS-CoV-2 by FDA under an Emergency Use Authorization (EUA).  This EUA will remain in effect (meaning this test can be used) for the duration of the COVID-19 declaration under Section 564(b)(1) of the Act, 21 U.S.C. section 360bbb-3(b)(1), unless the authorization is terminated or revoked sooner. Performed at [facility], [place] [address], [place], [place] [address]          Radiology Studies: DG Chest 1 View  Result Date: 03/05/2020 CLINICAL DATA:  Dyspnea EXAM: CHEST  1 VIEW COMPARISON:  Radiograph 03/03/2020, CT 03/03/2020 FINDINGS: Persistent loculated left pleural effusion with adjacent passive atelectatic changes in the lungs. Some streaky and bandlike opacities in the right lung base likely reflect atelectatic changes.  No right effusion. No pneumothorax. Cardiomediastinal contours as visualized are unchanged from prior with a calcified aorta and prior bioprosthetic aortic valve replacement. Pacer pack overlies the left chest wall with leads in stable position towards the right atrium and cardiac apex. Dense mitral annular calcification. Postsurgical changes related to prior  CABG including intact and aligned sternotomy wires and multiple surgical clips projecting over the mediastinum. No acute osseous or soft tissue abnormality. Degenerative changes are present in the imaged spine and shoulders. Marked elevation of the bilateral humeri relative to the glenoid fossae with acromial undersurface remodeling, likely chronic rotator cuff insufficiency. IMPRESSION: 1. Persistent loculated left pleural effusion with adjacent passive atelectatic changes in the lungs. Underlying consolidation/infection difficult to exclude. 2. Atelectatic changes in the right lung base. 3. Severe degenerative changes in the shoulders with features suggesting rotator cuff insufficiency. 4. Prior CABG and aortic valve replacement. Dense mitral annular calcification, can be seen with mitral insufficiency. Electronically Signed   By: Lovena Le M.D.   On: 03/05/2020 06:23   US Abdomen Limited RUQ  Result Date: 03/05/2020 CLINICAL DATA:  Right upper quadrant pain. EXAM: ULTRASOUND ABDOMEN LIMITED RIGHT UPPER QUADRANT COMPARISON:  Included portion from chest CT 03/03/2020 FINDINGS: Gallbladder: Physiologically distended. No gallstones or wall thickening visualized. No sonographic Murphy sign noted by sonographer. Common bile duct: Diameter: 4 mm, normal. Liver: No focal lesion identified. Within normal limits in parenchymal echogenicity. Portal vein is patent on color Doppler imaging with normal direction of blood flow towards the liver. Other: Right pleural effusion, as seen on recent chest CT. IMPRESSION: 1. Unremarkable sonographic appearance of the  gallbladder, liver, and biliary tree. 2. Right pleural effusion as seen on recent chest imaging. Electronically Signed   By: Keith Rake M.D.   On: 03/05/2020 14:20        Scheduled Meds: . aspirin EC  81 mg Oral Daily  . carvedilol  6.25 mg Oral Daily  . cholecalciferol  2,000 Units Oral Daily  . clopidogrel  75 mg Oral Daily  . enoxaparin (LOVENOX) injection  30 mg Subcutaneous Q24H  . [START ON 03/07/2020] furosemide  20 mg Oral Daily  . ketotifen  1 drop Both Eyes BID  . rosuvastatin  10 mg Oral QHS   Continuous Infusions:   Time spent: 26 minutes with over 50% of the time coordinating the patient's care    Harold Hedge, DO [facility] Pager [phone]  Call night coverage person covering after 7pm

## 2020-03-07 ENCOUNTER — Inpatient Hospital Stay (HOSPITAL_COMMUNITY): Payer: Medicare Other

## 2020-03-07 LAB — BASIC METABOLIC PANEL
Anion gap: 10 (ref 5–15)
BUN: 19 mg/dL (ref 8–23)
CO2: 28 mmol/L (ref 22–32)
Calcium: 8.5 mg/dL — ABNORMAL LOW (ref 8.9–10.3)
Chloride: 97 mmol/L — ABNORMAL LOW (ref 98–111)
Creatinine, Ser: 0.74 mg/dL (ref 0.44–1.00)
GFR calc Af Amer: >60 mL/min (ref >60)
GFR calc non Af Amer: >60 mL/min (ref >60)
Glucose, Bld: 118 mg/dL — ABNORMAL HIGH (ref 70–99)
Potassium: 3.3 mmol/L — ABNORMAL LOW (ref 3.5–5.1)
Sodium: 135 mmol/L (ref 135–145)

## 2020-03-07 LAB — PROCALCITONIN: Procalcitonin: <0.1 ng/mL

## 2020-03-07 MED ORDER — FUROSEMIDE 10 MG/ML IJ SOLN
20.0000 mg | Freq: Two times a day (BID) | INTRAMUSCULAR | Status: DC
  Administered 2020-03-07: 20 mg via INTRAVENOUS
  Filled 2020-03-07: qty 2

## 2020-03-07 MED ORDER — FUROSEMIDE 20 MG PO TABS: 20.0000 mg | ORAL_TABLET | Freq: Two times a day (BID) | ORAL | Status: DC

## 2020-03-07 MED ORDER — DM-GUAIFENESIN ER 30-600 MG PO TB12
1.0000 | ORAL_TABLET | Freq: Two times a day (BID) | ORAL | Status: DC
  Administered 2020-03-07 – 2020-03-10 (×7): 1 via ORAL
  Filled 2020-03-07 (×7): qty 1

## 2020-03-07 MED ORDER — ALBUMIN HUMAN 5 % IV SOLN
25.0000 g | Freq: Once | INTRAVENOUS | Status: AC
  Administered 2020-03-07: 25 g via INTRAVENOUS
  Filled 2020-03-07: qty 500

## 2020-03-07 MED ORDER — POTASSIUM CHLORIDE CRYS ER 20 MEQ PO TBCR
20.0000 meq | EXTENDED_RELEASE_TABLET | Freq: Once | ORAL | Status: DC
  Filled 2020-03-07: qty 1

## 2020-03-07 MED ORDER — FUROSEMIDE 10 MG/ML IJ SOLN: 20.0000 mg | Freq: Once | INTRAMUSCULAR | Status: DC

## 2020-03-07 MED ORDER — POTASSIUM CHLORIDE 10 MEQ/100ML IV SOLN
10.0000 meq | INTRAVENOUS | Status: AC
  Administered 2020-03-07 (×2): 10 meq via INTRAVENOUS
  Filled 2020-03-07 (×2): qty 100

## 2020-03-07 NOTE — Consult Note (Signed)
Consultation Note Date: 03/07/2020   Patient Name: Stephanie Cochran  DOB: 1917/10/28  MRN: [id number]  Age / Sex: 86 y.o., female  PCP: Cari Caraway, MD Referring Physician: Harold Hedge, MD  Reason for Consultation: Establishing goals of care  HPI/Patient Profile: 87 y.o. female  with past medical history of   paroxysmal atrial fibrillation/flutter, CAD status post CABG, hypertension, hyperlipidemia, aortic stenosis s/p bioprosthetic valve replacement, breast cancer s/p left mastectomy, PVD, OA,admitted on 03/03/2020 with worsening dyspnea and cough with increased work of breathing and coarse breath sounds bilaterally on presentation .   Clinical Assessment and Goals of Care:  Patient has been admitted to hospital medicine service from her facility [facility] due to dyspnea, acute cardiogenic pulmonary edema L loculated pleural effusion, deemed secondary to acute on chronic diastolic heart failure exacerbation. She has history of pacemaker implantation. She has been placed on diuretic regimen here.   A palliative consult has been requested for ongoing goals of care discussions.   Ms. Stephanie Cochran is a pleasant lady resting in bed, denies complaints, in no distress, is pleasant and cooperative. Discussed with bedside RN, call placed and discussed with daughter Stephanie Cochran. I introduced myself and palliative care as follows: Palliative medicine is specialized medical care for people living with serious illness. It focuses on providing relief from the symptoms and stress of a serious illness. The goal is to improve quality of life for both the patient and the family.  Goals of care: Broad aims of medical therapy in relation to the patient's values and preferences. Our aim is to provide medical care aimed at enabling patients to achieve the goals that matter most to them, given the circumstances of their particular medical  situation and their constraints.   A brief life review was done, this past year has been hard on the patient, she didn't get to see her family all that much, due to visitor restrictions in effect due to the COVID-19 pandemic. She is described as a friendly loving person. She has had some functional and cognitive decline.   Goals, wishes and values important to the patient and family as a unit attempted to be explored. We discussed about symptom burden from a heart failure standpoint, such as pain dyspnea and fatigue. We talked about possibility of recurrent exacerbations and recurrent volume overload due to irreversible nature of heart failure.   We talked about role of hospice or palliative services following with the patient at her ALF, daughter is in agreement.   NEXT OF KIN  daughter Stephanie Cochran.   SUMMARY OF RECOMMENDATIONS    Agree with DNR Continue current mode of care Recommend hospice consultation and follow up at her ALF on discharge.  Thank you for the consult.   Code Status/Advance Care Planning:  DNR    Symptom Management:    as above.   Palliative Prophylaxis:   Delirium Protocol   Psycho-social/Spiritual:   Desire for further Chaplaincy support:yes  Additional Recommendations: Caregiving  Support/Resources  Prognosis:   Unable to determine  Discharge Planning: recommend hospice services following at [facility] on discharge.        Primary Diagnoses: Present on Admission: . Coronary artery disease . Hypertension . Hyperlipidemia . Complete heart block ([place]) . AF (atrial fibrillation) ([place])   I have reviewed the medical record, interviewed the patient and family, and examined the patient. The following aspects are pertinent.  Past Medical History:  Diagnosis Date  . Aortic stenosis    a. s/p bioprosthetic AVR 2007 [facility]);  b. Echo (04/2009 - [facility] in [place], [place]):  Mild LVH, normal LVF, anterior WMA, AVR ok (mean 4 mmHg), MAC, mild MR, mild TV  stenosis, RVSP 44 mmHg  . Arthritis   . Atrial flutter ([place])    post operative  . Breast cancer ([place])    left mastectomy  . CAD (coronary artery disease)    s/p CABG 2007 at [facility]  . Diverticulosis    history  . HLD (hyperlipidemia)   . HTN (hypertension)   . Hx of echocardiogram    Echo (8/15):  Mild focal basal septal hypertrophy, EF 60-65%, no RWMA, AVR ok (mean 8 mmHg), MAC, mild MS (mean 4 mmHg), mod MR, mild LAE, PASP 54 mmHg  . Mobitz II    a. s/p STJ dual chamber PPM 2015  . Paroxysmal atrial fibrillation ([facility])   . Post-menopausal   . PVD (peripheral vascular disease) ([place])    Social History   Socioeconomic History  . Marital status: Widowed    Spouse name: Not on file  . Number of children: Not on file  . Years of education: Not on file  . Highest education level: Not on file  Occupational History  . Not on file  Tobacco Use  . Smoking status: Former Smoker    Types: Cigarettes    Quit date: 09/27/1960    Years since quitting: 59.4  . Smokeless tobacco: Never Used  . Tobacco comment: quit smoking 56 years ago  Substance and Sexual Activity  . Alcohol use: No  . Drug use: No  . Sexual activity: Not on file  Other Topics Concern  . Not on file  Social History Narrative  . Not on file   Social Determinants of Health   Financial Resource Strain:   . Difficulty of Paying Living Expenses:   Food Insecurity:   . Worried About Charity fundraiser in the Last Year:   . Arboriculturist in the Last Year:   Transportation Needs:   . Film/video editor (Medical):   Marland Kitchen Lack of Transportation (Non-Medical):   Physical Activity:   . Days of Exercise per Week:   . Minutes of Exercise per Session:   Stress:   . Feeling of Stress :   Social Connections:   . Frequency of Communication with Friends and Family:   . Frequency of Social Gatherings with Friends and Family:   . Attends Religious Services:   . Active Member of Clubs or Organizations:   . Attends English as a second language teacher Meetings:   Marland Kitchen Marital Status:    Family History  Problem Relation Age of Onset  . Colon cancer Sister   . Diabetes Unknown   . Heart disease Unknown   . Arthritis Unknown    Scheduled Meds: . aspirin EC  81 mg Oral Daily  . carvedilol  6.25 mg Oral Daily  . cholecalciferol  2,000 Units Oral Daily  . clopidogrel  75 mg Oral Daily  . enoxaparin (LOVENOX) injection  30  mg Subcutaneous Q24H  . ketotifen  1 drop Both Eyes BID  . rosuvastatin  10 mg Oral QHS   Continuous Infusions: . potassium chloride 10 mEq (03/07/20 1427)   PRN Meds:.ondansetron **OR** ondansetron (ZOFRAN) IV, polyvinyl alcohol, senna-docusate Medications Prior to Admission:  Prior to Admission medications   Medication Sig Start Date End Date Taking? Authorizing Provider  acetaminophen (TYLENOL) 650 MG CR tablet Take 1,300 mg by mouth every 6 (six) hours as needed (back pain). max 6 tabs in 24 hrs   Yes [provider]  aspirin EC 81 MG tablet Take 81 mg by mouth daily.   Yes [provider]  carvedilol (COREG) 6.25 MG tablet Take 1 tablet (6.25 mg total) by mouth daily. 09/03/15  Yes Dorothy Spark, MD  Cholecalciferol (VITAMIN D) 2000 UNITS tablet Take 2,000 Units by mouth daily.   Yes [provider]  clopidogrel (PLAVIX) 75 MG tablet Take 75 mg by mouth daily.   Yes [provider]  ketotifen (ZADITOR) 0.025 % ophthalmic solution Place 1 drop into both eyes 2 (two) times daily.   Yes [provider]  Liniments (SALONPAS PAIN RELIEF PATCH EX) Apply 1 patch topically daily as needed (pain).    Yes [provider]  Nutritional Supplements (ENSURE ENLIVE PO) Take 237 mLs by mouth 2 (two) times daily.   Yes [provider]  Polyethyl Glycol-Propyl Glycol (SYSTANE OP) Place 1 drop into both eyes 2 (two) times daily as needed (dry eyes).   Yes [provider]  rosuvastatin (CRESTOR) 10 MG tablet Take 1 tablet (10 mg total) by mouth  daily. Patient taking differently: Take 10 mg by mouth at bedtime.  12/18/16  Yes Hongalgi, Lenis Dickinson, MD  senna (SENOKOT) 8.6 MG tablet Take 2 tablets as directed by mouth. At bedtime as needed for constipation. May have up to 2 times a week   Yes [provider]  traMADol (ULTRAM) 50 MG tablet Take 50 mg by mouth every 12 (twelve) hours as needed (pain).   Yes [provider]  Wheat Dextrin (BENEFIBER) POWD Take 1 Scoop by mouth daily with supper. 1 Tablespoon   Yes [provider]   Allergies  Allergen Reactions  . Penicillins Nausea And Vomiting and Anaphylaxis  . Codeine Nausea And Vomiting  . Vicodin [Hydrocodone-Acetaminophen] Nausea And Vomiting   Review of Systems Denies pain.   Physical Exam Resting comfortably No distress Is frail Regular work of breathing Abdomen is not distended S1 S2  Vital Signs: BP (!) 109/51 (BP Location: Right Arm)   Pulse 70   Temp 98.2 F (36.8 C) (Oral)   Resp 15   Ht _0  (1.6 m)   Wt 50.5 kg   SpO2 97%   BMI 19.72 kg/m  Pain Scale: 0-10   Pain Score: 0-No pain   SpO2: SpO2: 97 % O2 Device:SpO2: 97 % O2 Flow Rate: .   IO: Intake/output summary:   Intake/Output Summary (Last 24 hours) at 03/07/2020 1437 Last data filed at 03/06/2020 1745 Gross per 24 hour  Intake --  Output 300 ml  Net -300 ml    LBM: Last BM Date: 03/03/20 Baseline Weight: Weight: 52 kg Most recent weight: Weight: 50.5 kg     Palliative Assessment/Data:   PPS 40%  Time In:  1330 Time Out:  1430 Time Total:  60  Greater than 50%  of this time was spent counseling and coordinating care related to the above assessment and plan.  Signed by: Loistine Chance, MD   Please contact Palliative Medicine Team phone at [phone] for questions and concerns.  For individual provider: See Shea Evans

## 2020-03-07 NOTE — [place] FL2 (Signed)
[place] MEDICAID FL2 LEVEL OF CARE SCREENING TOOL     IDENTIFICATION  Patient Name: Stephanie Cochran Birthdate: September 06, 1918 Sex: female Admission Date (Current Location): 03/03/2020  [facility] and [place] Number:  Herbalist and Address:  [facility],  [place] [address], [place]      Provider Number: [id number]  Attending Physician Name and Address:  Harold Hedge, MD  Relative Name and Phone Number:       Current Level of Care: Hospital Recommended Level of Care: [place] Prior Approval Number:    Date Approved/Denied:   PASRR Number:    Discharge Plan: Domiciliary (Rest home)    Current Diagnoses: Patient Active Problem List   Diagnosis Date Noted  . Pulmonary edema, acute, with congestive heart disease ([place]) 03/04/2020  . Pleural effusion   . Acute respiratory failure ([place]) 03/03/2020  . AF (atrial fibrillation) ([place]) 12/23/2016  . S/P AVR (aortic valve replacement) 12/23/2016  . FTT (failure to thrive) in adult 12/23/2016  . Near syncope 12/14/2016  . Acute encephalopathy 12/14/2016  . Elevated troponin 12/14/2016  . Anemia 12/14/2016  . Acute ischemic left MCA stroke ([place])   . Encephalopathy   . CHF (congestive heart failure) ([place]) 12/09/2015  . Risk for falls 12/09/2015  . History of recent fall 12/09/2015  . Complete heart block ([place]) 05/13/2014  . AV block, 2nd degree 05/13/2014  . Atherosclerosis of renal artery ([place]) 01/04/2012  . Lower extremity edema 11/11/2011  . Coronary artery disease 11/11/2011  . History of aortic valve replacement with bioprosthetic valve 11/11/2011  . History of renal artery stenosis 11/11/2011  . Hypertension 11/11/2011  . Hyperlipidemia 11/11/2011    Orientation RESPIRATION BLADDER Height & Weight     Self  Normal Continent Weight: 50.5 kg Height:  5\' 3"  (160 cm)  BEHAVIORAL SYMPTOMS/MOOD NEUROLOGICAL BOWEL NUTRITION STATUS   (none)  (none) Continent Diet (see d/c  summary)  AMBULATORY STATUS COMMUNICATION OF NEEDS Skin   Extensive Assist Verbally Normal                       Personal Care Assistance Level of Assistance  Bathing, Feeding, Dressing Bathing Assistance: Maximum assistance Feeding assistance: Limited assistance Dressing Assistance: Maximum assistance     Functional Limitations Info  Sight, Hearing, Speech Sight Info: Adequate Hearing Info: Adequate Speech Info: Adequate    SPECIAL CARE FACTORS FREQUENCY  PT (By licensed PT), OT (By licensed OT)     PT Frequency: 2X/W OT Frequency: 2X/W            Contractures Contractures Info: Not present    Additional Factors Info  Code Status, Allergies Code Status Info: DNR Allergies Info: Codeine, Vicodin, Penicillins           Current Medications (03/07/2020):      Discharge Medications:  acetaminophen 650 MG CR tablet Commonly known as: TYLENOL Take 1,300 mg by mouth every 6 (six) hours as needed (back pain). max 6 tabs in 24 hrs           aspirin EC 81 MG tablet Take 81 mg by mouth daily.          Benefiber Powd Take 1 Scoop by mouth daily with supper. 1 Tablespoon          carvedilol 6.25 MG tablet Commonly known as: COREG Take 1 tablet (6.25 mg total) by mouth daily.          clopidogrel 75  MG tablet Commonly known as: PLAVIX Take 75 mg by mouth daily.          ENSURE ENLIVE PO Take 237 mLs by mouth 2 (two) times daily.         Icon medications to start taking   furosemide 20 MG tablet Commonly known as: LASIX Take 1 tablet (20 mg total) by mouth daily. Take an additional 20 mg tablet at night if short of breath or edema          ketotifen 0.025 % ophthalmic solution Commonly known as: ZADITOR Place 1 drop into both eyes 2 (two) times daily.          rosuvastatin 10 MG tablet Commonly known as: CRESTOR Take 1 tablet (10 mg total) by mouth daily.          SALONPAS PAIN RELIEF PATCH EX Apply 1 patch topically daily as needed (pain).          senna  8.6 MG tablet Commonly known as: SENOKOT Take 2 tablets as directed by mouth. At bedtime as needed for constipation. May have up to 2 times a week          SYSTANE OP Place 1 drop into both eyes 2 (two) times daily as needed (dry eyes).          traMADol 50 MG tablet Commonly known as: ULTRAM Take 50 mg by mouth every 12 (twelve) hours as needed (pain).          Vitamin D 50 MCG (2000 UT) tablet Take 2,000 Units by mouth daily.             Relevant Imaging Results:  Relevant Lab Results:   Additional Information SSN:  [id number]  Trish Mage, LCSW

## 2020-03-07 NOTE — Progress Notes (Signed)
PROGRESS NOTE    Stephanie Cochran    Code Status: DNR  PQZ:[id number] DOB: May 27, 1918 DOA: 03/03/2020 LOS: 4 days  PCP: Cari Caraway, MD CC:  Chief Complaint  Patient presents with  . Cough       Hospital Summary   This is a 86 year old female with history of paroxysmal atrial fibrillation/flutter, CAD status post CABG, hypertension, hyperlipidemia, aortic stenosis s/p bioprosthetic valve replacement, breast cancer s/p left mastectomy, PVD, OA who was noted to have worsening dyspnea and cough with increased work of breathing and coarse breath sounds bilaterally on presentation.  In the FH:LKTGYB 137, potassium 4.0, chloride 105, bicarb 24, glucose 101, BUN 12, creatinine 0.65, pro calcitonin less than 0.10, white count 4.9, hemoglobin 11.2, hematocrit 34.5, platelets 216.  SARS COVID-19 negative.  Chest radiograph with left pleural effusion and hilar vascular congestion, cephalization of the vasculature.  CT chest with bilateral pleural effusions, more left than right, left-sided loculation, faint bilateral groundglass opacities.  Dependent/compressive atelectasis.  EKG 70 bpm,  V paced, 100%, with secondary repolarization changes.  Palliative care consulted: Hospice referral once discharged      A & P   Principal Problem:   Pulmonary edema, acute, with congestive heart disease (HCC) Active Problems:   Coronary artery disease   History of aortic valve replacement with bioprosthetic valve   Hypertension   Hyperlipidemia   Complete heart block (HCC)   CHF (congestive heart failure) (HCC)   AF (atrial fibrillation) (HCC)   S/P AVR (aortic valve replacement)   Pleural effusion   1. Dyspnea secondary to atelectasis and acute cardiogenic pulmonary edema and left loculated pleural effusion secondary to acute on chronic diastolic heart failure exacerbation, not in respiratory failure a. Tolerating room air, BNP 171 on admission minimally elevated b. EF 60 to 65% with moderate LVH and  moderately reduced RV systolic function, severe mitral calcification and mild MR, mild to moderate TR c. Diuresed well with Lasix 20 mg IV twice daily which was switched to QD and her weight increased from 45-> 50 kg today. d. Persistent left loculated pleural effusion on CXR without improvement however not a good candidate for thoracentesis e. Daily weight and I/O f. Incentive spirometer g. Palliative consulted: hospice consult and follow up at her ALF on discharge h. procalcitonin negative, unlikely infectious i. Will continue on Lasix IV BID for now since her weight increased and increased fluid in right fissure on personal read.  j. Unfortunately this is likely going to be difficult to control as the loculation will not be able to be surgically intervened upon. Will add on albumin x 1 k. Will add on mucinex for her productive cough  2. Paroxysmal atrial fibrillation with CHB s/p pacer, stable  3. RUQ pain of unknown significance a. RUQ ultrasound unremarkable  4. Hypertension/hyperlipidemia/CAD a. Continue Coreg, aspirin, Plavix, statin  5. AS s/p valve replacement a. Continue diuresis  6. History of breast cancer, stable  7. Hypokalemia, resolved  DVT prophylaxis: Lovenox Family Communication: Patient's family at bedside has been updated  Disposition Plan: No transport to ALF over the weekend until Monday. Hopefully can discharge on Monday if improved volume status. Status is: Inpatient  Remains inpatient appropriate because:IV treatments appropriate due to intensity of illness or inability to take PO   Dispo: The patient is from: ALF              Anticipated d/c is to: ALF  Anticipated d/c date is: 2 days              Patient currently is not medically stable to d/c.          Pressure injury documentation    None  Consultants  Palliative care  Procedures  None  Antibiotics   Anti-infectives (From admission, onward)   Start     Dose/Rate  Route Frequency Ordered Stop   03/04/20 1100  azithromycin (ZITHROMAX) 500 mg in sodium chloride 0.9 % 250 mL IVPB  Status:  Discontinued        500 mg 250 mL/hr over 60 Minutes Intravenous Every 24 hours 03/03/20 1602 03/04/20 1430   03/03/20 1800  ceFEPIme (MAXIPIME) 2 g in sodium chloride 0.9 % 100 mL IVPB  Status:  Discontinued        2 g 200 mL/hr over 30 Minutes Intravenous Every 12 hours 03/03/20 1614 03/04/20 1430   03/03/20 1130  cefTRIAXone (ROCEPHIN) 1 g in sodium chloride 0.9 % 100 mL IVPB        1 g 200 mL/hr over 30 Minutes Intravenous  Once 03/03/20 1118 03/03/20 1242   03/03/20 1130  azithromycin (ZITHROMAX) tablet 500 mg        500 mg Oral  Once 03/03/20 1118 03/03/20 1135        Subjective   States she feels tired. Family at bedside states she has been sleeping a lot lately. She has a cough and is having difficulty with expectorating sputum. No fevers, no other issues.   Objective   Vitals:   03/06/20 2019 03/07/20 0500 03/07/20 0504 03/07/20 1321  BP: (!) 98/51  (!) 112/53 (!) 109/51  Pulse: 70  70 70  Resp: 16  15 15   Temp: 98.6 F (37 C)  97.9 F (36.6 C) 98.2 F (36.8 C)  TempSrc:    Oral  SpO2: 95%  97% 97%  Weight:  50.5 kg    Height:        Intake/Output Summary (Last 24 hours) at 03/07/2020 1516 Last data filed at 03/06/2020 1745 Gross per 24 hour  Intake --  Output 300 ml  Net -300 ml   Filed Weights   03/03/20 0814 03/06/20 0439 03/07/20 0500  Weight: 52 kg 45.3 kg 50.5 kg    Examination:  Physical Exam Vitals and nursing note reviewed.  Constitutional:      Appearance: Normal appearance.  HENT:     Head: Normocephalic and atraumatic.  Eyes:     Conjunctiva/sclera: Conjunctivae normal.  Cardiovascular:     Rate and Rhythm: Normal rate and regular rhythm.  Pulmonary:     Effort: Pulmonary effort is normal.     Comments: Coughing during exam, difficult to auscultate Abdominal:     General: Abdomen is flat.     Palpations:  Abdomen is soft.  Musculoskeletal:        General: No swelling or tenderness.  Skin:    Coloration: Skin is not jaundiced or pale.  Neurological:     Mental Status: She is alert. Mental status is at baseline.  Psychiatric:        Mood and Affect: Mood normal.        Behavior: Behavior normal.     Data Reviewed: I have personally reviewed following labs and imaging studies  CBC: Recent Labs  Lab 03/03/20 0837 03/04/20 0455 03/06/20 0409  WBC 4.9 5.0 4.7  NEUTROABS 2.9  --   --   HGB 11.2* 11.1* 11.5*  HCT 34.5* 33.1* 34.3*  MCV 103.6* 100.0 100.6*  PLT 216 235 341   Basic Metabolic Panel: Recent Labs  Lab 03/03/20 0837 03/04/20 0455 03/06/20 0409 03/07/20 0403  NA 137 137 135 135  K 4.0 3.5 3.5 3.3*  CL 105 101 97* 97*  CO2 24 25 28 28   GLUCOSE 101* 93 90 118*  BUN 12 12 16 19   CREATININE 0.65 0.79 0.76 0.74  CALCIUM 8.3* 8.4* 8.4* 8.5*  MG  --   --  1.9  --    GFR: Estimated Creatinine Clearance: 29.8 mL/min (by C-G formula based on SCr of 0.74 mg/dL). Liver Function Tests: No results for input(s): AST, ALT, ALKPHOS, BILITOT, PROT, ALBUMIN in the last 168 hours. No results for input(s): LIPASE, AMYLASE in the last 168 hours. No results for input(s): AMMONIA in the last 168 hours. Coagulation Profile: No results for input(s): INR, PROTIME in the last 168 hours. Cardiac Enzymes: No results for input(s): CKTOTAL, CKMB, CKMBINDEX, TROPONINI in the last 168 hours. BNP (last 3 results) No results for input(s): PROBNP in the last 8760 hours. HbA1C: No results for input(s): HGBA1C in the last 72 hours. CBG: No results for input(s): GLUCAP in the last 168 hours. Lipid Profile: No results for input(s): CHOL, HDL, LDLCALC, TRIG, CHOLHDL, LDLDIRECT in the last 72 hours. Thyroid Function Tests: No results for input(s): TSH, T4TOTAL, FREET4, T3FREE, THYROIDAB in the last 72 hours. Anemia Panel: No results for input(s): VITAMINB12, FOLATE, FERRITIN, TIBC, IRON,  RETICCTPCT in the last 72 hours. Sepsis Labs: Recent Labs  Lab 03/03/20 0837 03/07/20 0403  PROCALCITON <0.10 <0.10    Recent Results (from the past 240 hour(s))  SARS Coronavirus 2 by RT PCR (hospital order, performed in [facility] hospital lab) Nasopharyngeal Nasopharyngeal Swab     Status: None   Collection Time: 03/03/20  8:38 AM   Specimen: Nasopharyngeal Swab  Result Value Ref Range Status   SARS Coronavirus 2 NEGATIVE NEGATIVE Final    Comment: (NOTE) SARS-CoV-2 target nucleic acids are NOT DETECTED. The SARS-CoV-2 RNA is generally detectable in upper and lower respiratory specimens during the acute phase of infection. The lowest concentration of SARS-CoV-2 viral copies this assay can detect is 250 copies / mL. A negative result does not preclude SARS-CoV-2 infection and should not be used as the sole basis for treatment or other patient management decisions.  A negative result may occur with improper specimen collection / handling, submission of specimen other than nasopharyngeal swab, presence of viral mutation(s) within the areas targeted by this assay, and inadequate number of viral copies (<250 copies / mL). A negative result must be combined with clinical observations, patient history, and epidemiological information. Fact Sheet for Patients:   StrictlyIdeas.no Fact Sheet for Healthcare Providers: BankingDealers.co.za This test is not yet approved or cleared  by the [place] FDA and has been authorized for detection and/or diagnosis of SARS-CoV-2 by FDA under an Emergency Use Authorization (EUA).  This EUA will remain in effect (meaning this test can be used) for the duration of the COVID-19 declaration under Section 564(b)(1) of the Act, 21 U.S.C. section 360bbb-3(b)(1), unless the authorization is terminated or revoked sooner. Performed at [facility], [place] [address] [place], [place]  [address]          Radiology Studies: DG CHEST PORT 1 VIEW  Result Date: 03/07/2020 CLINICAL DATA:  Pleural effusion EXAM: PORTABLE CHEST 1 VIEW COMPARISON:  Radiograph 03/05/2020 FINDINGS: No significant interval change in the  complex, loculated left pleural effusion with adjacent opacities in the left lung likely reflecting some associated atelectatic change. Pleural thickening in the right lung base may reflect a small right effusion as well. Hazy opacities in the aerated portions of the lungs likely reflecting a combination of atelectatic change and possible edema. Stable cardiomediastinal contours with a tortuous, calcified aorta, postsurgical changes from prior CABG and aortic valve replacement, mitral annular calcifications, and cardiomegaly. Stable positioning pacer leads at the right atrium and cardiac apex with pacer pack overlying the left chest wall. Severe degenerative changes in the shoulders. Multilevel degenerative changes are present in the imaged portions of the spine. No acute osseous or soft tissue abnormality. IMPRESSION: Persistent loculated left pleural effusion and small right effusion with adjacent passive atelectatic changes. Superimposed airspace disease is difficult to exclude. Electronically Signed   By: Lovena Le M.D.   On: 03/07/2020 07:14        Scheduled Meds: . aspirin EC  81 mg Oral Daily  . carvedilol  6.25 mg Oral Daily  . cholecalciferol  2,000 Units Oral Daily  . clopidogrel  75 mg Oral Daily  . enoxaparin (LOVENOX) injection  30 mg Subcutaneous Q24H  . ketotifen  1 drop Both Eyes BID  . rosuvastatin  10 mg Oral QHS   Continuous Infusions: . potassium chloride 10 mEq (03/07/20 1427)     Time spent: 26 minutes with over 50% of the time coordinating the patient's care    Harold Hedge, DO [facility] Pager [phone]  Call night coverage person covering after 7pm

## 2020-03-07 NOTE — Consult Note (Signed)
Pt alert and aware in bed. She states she is doing fair. She complained about something in her upper chest that she didn't know what it was but believes it will go away. She said that God would show things from time to time and asked me if ZI saw it? But I told her that God was just showing her and when he wanted me to see he would show me. I offered caring and supportive presence, prayers and blessings. She daughter asked [place] to sing and we sand several hymns together. Further visits will be offered.

## 2020-03-07 NOTE — Progress Notes (Signed)
Occupational Therapy Treatment Patient Details Name: Stephanie Cochran MRN: [id number] DOB: Apr 21, 1918 Today's Date: 03/07/2020    History of present illness 86 year old female with past medical history for paroxysmal atrial fibrillation/flutter, CAD status post CABG 2007, hypertension, dyslipidemia, arctic stenosis status post bioprosthetic valve replacement 2007, breast cancer status post left mastectomy, peripheral vascular disease and osteoarthritis, admitted 03/03/20 with increased WOB/SOB.CT chest with bilateral pleural effusions, more left than right, left-sided loculation, faint bilateral groundglass opacities.  Dependent/compressive atelectasis   OT comments  Today patient required max x 2 to transfer to side of the bed. Patient resistant to movement and exhibiting posterior lean making further movement unsafe. Patient returned to supine. Therapist discussed POC with patient's daughter in room and daughter wants her mother to continue to attempt to get out of bed. Patient confused and conversation tangential and patient unable to comprehend therapeutic process.   Follow Up Recommendations  Home health OT;Supervision/Assistance - 24 hour    Equipment Recommendations  None recommended by OT    Recommendations for Other Services      Precautions / Restrictions Precautions Precautions: Fall Precaution Comments: posterior lean       Mobility Bed Mobility Overal bed mobility: Needs Assistance       Supine to sit: +2 for physical assistance;Max assist     General bed mobility comments: Attempted supine to sit today. Max x 2 for transfer into sitting. Patient resistant and exhibiting significant posterior lean. Able to get patient to learn forward once with tactile cues but patient leaning back and hips scooting forward. Patient's movement too unsafe to continue to attempt full sitting. Patient returned to supine - with patient able to help going back into bed - still max assist. +2  for bed mobility.  Transfers                      Balance Overall balance assessment: Needs assistance Sitting-balance support: No upper extremity supported;Feet unsupported Sitting balance-Leahy Scale: Poor   Postural control: Posterior lean                                 ADL either performed or assessed with clinical judgement   ADL                                               Vision       Perception     Praxis      Cognition Arousal/Alertness: Awake/alert Behavior During Therapy: WFL for tasks assessed/performed Overall Cognitive Status: History of cognitive impairments - at baseline                                 General Comments: Patient  unable to comprehend therapeutic process and unable to be reasoned with. Patient's conversation is tangential and patient easily distractable.        Exercises     Shoulder Instructions       General Comments      Pertinent Vitals/ Pain       Pain Assessment: No/denies pain  Home Living  Prior Functioning/Environment              Frequency  Min 2X/week        Progress Toward Goals  OT Goals(current goals can now be found in the care plan section)        Plan Discharge plan remains appropriate    Co-evaluation          OT goals addressed during session: ADL's and self-care      AM-PAC OT "6 Clicks" Daily Activity     Outcome Measure   Help from another person eating meals?: A Little Help from another person taking care of personal grooming?: A Little Help from another person toileting, which includes using toliet, bedpan, or urinal?: Total Help from another person bathing (including washing, rinsing, drying)?: A Lot Help from another person to put on and taking off regular upper body clothing?: A Lot Help from another person to put on and taking off regular lower body  clothing?: Total 6 Click Score: 12    End of Session    OT Visit Diagnosis: Unsteadiness on feet (R26.81);Other abnormalities of gait and mobility (R26.89);Muscle weakness (generalized) (M62.81)   Activity Tolerance  (Patient resistant to transfers, sitting at side of bed.)   Patient Left in bed;with call bell/phone within reach;with bed alarm set;with family/visitor present   Nurse Communication Mobility status        Time: 1937-9024 OT Time Calculation (min): 12 min  Charges: OT General Charges $OT Visit: 1 Visit OT Treatments $Therapeutic Activity: 8-22 mins  Derl Barrow, OTR/L [place]  Office [phone] Pager: [place] 03/07/2020, 5:01 PM

## 2020-03-08 DIAGNOSIS — Z7189 Other specified counseling: Secondary | ICD-10-CM

## 2020-03-08 LAB — BASIC METABOLIC PANEL
Anion gap: 10 (ref 5–15)
BUN: 15 mg/dL (ref 8–23)
CO2: 27 mmol/L (ref 22–32)
Calcium: 8.5 mg/dL — ABNORMAL LOW (ref 8.9–10.3)
Chloride: 99 mmol/L (ref 98–111)
Creatinine, Ser: 0.78 mg/dL (ref 0.44–1.00)
GFR calc Af Amer: >60 mL/min (ref >60)
GFR calc non Af Amer: >60 mL/min (ref >60)
Glucose, Bld: 105 mg/dL — ABNORMAL HIGH (ref 70–99)
Potassium: 3.1 mmol/L — ABNORMAL LOW (ref 3.5–5.1)
Sodium: 136 mmol/L (ref 135–145)

## 2020-03-08 MED ORDER — ACETAMINOPHEN 500 MG PO TABS
500.0000 mg | ORAL_TABLET | Freq: Four times a day (QID) | ORAL | Status: DC | PRN
  Administered 2020-03-08 – 2020-03-09 (×2): 500 mg via ORAL
  Filled 2020-03-08 (×2): qty 1

## 2020-03-08 MED ORDER — POTASSIUM CHLORIDE 10 MEQ/100ML IV SOLN
10.0000 meq | INTRAVENOUS | Status: AC
  Administered 2020-03-08 (×2): 10 meq via INTRAVENOUS
  Filled 2020-03-08: qty 100

## 2020-03-08 MED ORDER — POTASSIUM CHLORIDE 10 MEQ/100ML IV SOLN
INTRAVENOUS | Status: AC
  Filled 2020-03-08: qty 100

## 2020-03-08 MED ORDER — ACETAMINOPHEN 500 MG PO TABS: 500.0000 mg | ORAL_TABLET | Freq: Four times a day (QID) | ORAL | Status: DC | PRN

## 2020-03-08 MED ORDER — POTASSIUM CHLORIDE 10 MEQ/100ML IV SOLN
10.0000 meq | INTRAVENOUS | Status: AC
  Administered 2020-03-08 (×2): 10 meq via INTRAVENOUS
  Filled 2020-03-08 (×2): qty 100

## 2020-03-08 MED ORDER — FUROSEMIDE 10 MG/ML IJ SOLN: 40.0000 mg | Freq: Two times a day (BID) | INTRAMUSCULAR | Status: DC

## 2020-03-08 MED ORDER — POTASSIUM CHLORIDE CRYS ER 20 MEQ PO TBCR
20.0000 meq | EXTENDED_RELEASE_TABLET | Freq: Once | ORAL | Status: AC
  Administered 2020-03-10: 20 meq via ORAL
  Filled 2020-03-08: qty 1

## 2020-03-08 MED ORDER — FUROSEMIDE 10 MG/ML IJ SOLN
20.0000 mg | Freq: Two times a day (BID) | INTRAMUSCULAR | Status: DC
  Administered 2020-03-08 – 2020-03-09 (×3): 20 mg via INTRAVENOUS
  Filled 2020-03-08 (×3): qty 2

## 2020-03-08 NOTE — Progress Notes (Signed)
Daily Progress Note   Patient Name: Stephanie Cochran       Date: 03/08/2020 DOB: [birth year]  Age: 86 y.o. MRN#: [id number] Attending Physician: Harold Hedge, MD Primary Care Physician: Cari Caraway, MD Admit Date: 03/03/2020  Reason for Consultation/Follow-up: Establishing goals of care  Subjective: Patient is awake alert sitting up in bed She complains of pain in her chest wall.  Daughter and son in law at bedside.    Length of Stay: 5  Current Medications: Scheduled Meds:  . aspirin EC  81 mg Oral Daily  . carvedilol  6.25 mg Oral Daily  . cholecalciferol  2,000 Units Oral Daily  . clopidogrel  75 mg Oral Daily  . dextromethorphan-guaiFENesin  1 tablet Oral BID  . enoxaparin (LOVENOX) injection  30 mg Subcutaneous Q24H  . furosemide  20 mg Intravenous BID  . ketotifen  1 drop Both Eyes BID  . potassium chloride  20 mEq Oral Once  . rosuvastatin  10 mg Oral QHS    Continuous Infusions: . potassium chloride      PRN Meds: acetaminophen, ondansetron **OR** ondansetron (ZOFRAN) IV, polyvinyl alcohol, senna-docusate  Physical Exam         Awake alert Chest wall discomfort  Regular work of breathing S1 S2 Abdomen soft non tender No edema  Vital Signs: BP (!) 109/51 (BP Location: Right Arm)   Pulse 71   Temp 97.6 F (36.4 C)   Resp 16   Ht 5\' 3"  (1.6 m)   Wt 49.2 kg   SpO2 97%   BMI 19.21 kg/m  SpO2: SpO2: 97 % O2 Device: O2 Device: Room Air O2 Flow Rate:    Intake/output summary:   Intake/Output Summary (Last 24 hours) at 03/08/2020 1116 Last data filed at 03/08/2020 0900 Gross per 24 hour  Intake 480 ml  Output 750 ml  Net -270 ml   LBM: Last BM Date: 03/03/20 Baseline Weight: Weight: 52 kg Most recent weight: Weight: 49.2 kg       Palliative  Assessment/Data:      Patient Active Problem List   Diagnosis Date Noted  . Pulmonary edema, acute, with congestive heart disease ([place]) 03/04/2020  . Pleural effusion   . Acute respiratory failure ([place]) 03/03/2020  . AF (atrial fibrillation) ([place]) 12/23/2016  . S/P AVR (aortic valve  replacement) 12/23/2016  . FTT (failure to thrive) in adult 12/23/2016  . Near syncope 12/14/2016  . Acute encephalopathy 12/14/2016  . Elevated troponin 12/14/2016  . Anemia 12/14/2016  . Acute ischemic left MCA stroke ([place])   . Encephalopathy   . CHF (congestive heart failure) ([place]) 12/09/2015  . Risk for falls 12/09/2015  . History of recent fall 12/09/2015  . Complete heart block ([place]) 05/13/2014  . AV block, 2nd degree 05/13/2014  . Atherosclerosis of renal artery ([place]) 01/04/2012  . Lower extremity edema 11/11/2011  . Coronary artery disease 11/11/2011  . History of aortic valve replacement with bioprosthetic valve 11/11/2011  . History of renal artery stenosis 11/11/2011  . Hypertension 11/11/2011  . Hyperlipidemia 11/11/2011    Palliative Care Assessment & Plan   Patient Profile:  86 y.o. female  with past medical history of   paroxysmal atrial fibrillation/flutter, CAD status post CABG, hypertension, hyperlipidemia, aortic stenosis s/p bioprosthetic valve replacement, breast cancer s/p left mastectomy, PVD, OA,admitted on 03/03/2020 with worsening dyspnea and cough with increased work of breathing and coarse breath sounds bilaterally on presentation.    Assessment:  Patient has been admitted to hospital medicine service from her facility [facility] due to dyspnea, acute cardiogenic pulmonary edema L loculated pleural effusion, deemed secondary to acute on chronic diastolic heart failure exacerbation. She has history of pacemaker implantation. She has been placed on diuretic regimen here.   A palliative consult has been requested for ongoing goals of care discussions.    Recommendations/Plan:   A family meeting was held, with Dr Neysa Bonito, patient's daughter [name] and son in law at the patient's room this morning. We reviewed about the patient's current condition, overall disease trajectory from a CHF standpoint. Signs and symptoms from end stage CHF discussed with family. We talked about hospice versus palliative services in the facility on discharge.   Goals of Care and Additional Recommendations:  Limitations on Scope of Treatment:   Code Status:    Code Status Orders  (From admission, onward)         Start     Ordered   03/03/20 1141  Do not attempt resuscitation (DNR)  Continuous       Question Answer Comment  In the event of cardiac or respiratory ARREST Do not call a "code blue"   In the event of cardiac or respiratory ARREST Do not perform Intubation, CPR, defibrillation or ACLS   In the event of cardiac or respiratory ARREST Use medication by any route, position, wound care, and other measures to relive pain and suffering. May use oxygen, suction and manual treatment of airway obstruction as needed for comfort.      03/03/20 1151        Code Status History    Date Active Date Inactive Code Status Order ID Comments User Context   12/14/2016 1410 12/18/2016 1846 DNR [id number]  Dionne Milo, NP ED   12/14/2016 1410 12/14/2016 1410 DNR [id number]  Dionne Milo, NP ED   05/14/2014 1659 05/15/2014 1736 Full Code [id number]  [name], MD Inpatient   05/13/2014 1902 05/14/2014 1659 Full Code [id number]  Burnell Blanks, MD Inpatient   Advance Care Planning Activity    Advance Directive Documentation     Most Recent Value  Type of Advance Directive Out of facility DNR (pink MOST or yellow form)  Pre-existing out of facility DNR order (yellow form or pink MOST form) Yellow form placed in chart (order not valid for inpatient  use)  "MOST" Form in Place? --       Prognosis:   < 6 months  Discharge Planning:  ALF with  hospice on discharge.   Care plan was discussed with  Patient, Dr Neysa Bonito [facility] MD, daughter and son in law.   Thank you for allowing the Palliative Medicine Team to assist in the care of this patient.   Time In: 9.30 Time Out: 10.05 Total Time 35 Prolonged Time Billed No       Greater than 50%  of this time was spent counseling and coordinating care related to the above assessment and plan.  Loistine Chance, MD  Please contact Palliative Medicine Team phone at [phone] for questions and concerns.

## 2020-03-08 NOTE — Progress Notes (Addendum)
PROGRESS NOTE    Stephanie Cochran    Code Status: DNR  BTD:[id number] DOB: 12-Oct-1917 DOA: 03/03/2020 LOS: 5 days  PCP: Cari Caraway, MD CC:  Chief Complaint  Patient presents with  . Cough       Hospital Summary   This is a 86 year old female with history of paroxysmal atrial fibrillation/flutter, CAD status post CABG, hypertension, hyperlipidemia, aortic stenosis s/p bioprosthetic valve replacement, breast cancer s/p left mastectomy, PVD, OA who was noted to have worsening dyspnea and cough with increased work of breathing and coarse breath sounds bilaterally on presentation.  In the TG:GYIRSW 137, potassium 4.0, chloride 105, bicarb 24, glucose 101, BUN 12, creatinine 0.65, pro calcitonin less than 0.10, white count 4.9, hemoglobin 11.2, hematocrit 34.5, platelets 216.  SARS COVID-19 negative.  Chest radiograph with left pleural effusion and hilar vascular congestion, cephalization of the vasculature.  CT chest with bilateral pleural effusions, more left than right, left-sided loculation, faint bilateral groundglass opacities.  Dependent/compressive atelectasis.  EKG 70 bpm,  V paced, 100%, with secondary repolarization changes.  Palliative care consulted: Hospice referral once discharged      A & P   Principal Problem:   Pulmonary edema, acute, with congestive heart disease (HCC) Active Problems:   Coronary artery disease   History of aortic valve replacement with bioprosthetic valve   Hypertension   Hyperlipidemia   Complete heart block (HCC)   CHF (congestive heart failure) (HCC)   AF (atrial fibrillation) (HCC)   S/P AVR (aortic valve replacement)   Pleural effusion   1. Dyspnea secondary to atelectasis and acute cardiogenic pulmonary edema and left loculated pleural effusion secondary to acute on chronic diastolic heart failure exacerbation, not in respiratory failure a. Tolerating room air, BNP 171 on admission minimally elevated b. EF 60 to 65% with moderate LVH and  moderately reduced RV systolic function, severe mitral calcification and mild MR, mild to moderate TR c. Persistent left loculated pleural effusion on CXR without improvement and not a good candidate for thoracentesis d. Daily weight and I/O e. Incentive spirometer f. Palliative consulted: hospice consult and follow up at her ALF on discharge g. procalcitonin negative, unlikely infectious h. Unfortunately this is likely going to be difficult to control as the loculation will not be able to be surgically intervened upon.  i. Will add on mucinex for her cough j. Goals of care discussion today with myself, family and palliative care present: Will see how she does with continued diuresis over the weekend Lasix 20 mg IV twice daily and likely discharge early in the week with p.o. Lasix to ALF to help with volume status and symptom management and hospice referral at that time  2. Paroxysmal atrial fibrillation with CHB s/p pacer, stable  3. RUQ pain of unknown significance a. RUQ ultrasound unremarkable  4. Hypertension/hyperlipidemia/CAD a. Continue Coreg, aspirin, Plavix, statin  5. AS s/p valve replacement a. Continue diuresis  6. History of breast cancer, stable  7. Hypokalemia,  a. Replace through IV as she cannot swallow potassium tablets  8. Musculoskeletal chest wall pain from coughing a. Continue Mucinex b. Continue Tylenol  DVT prophylaxis: Lovenox Family Communication: Patient's family at bedside has been updated  Disposition Plan:  Status is: Inpatient  Remains inpatient appropriate because:IV treatments appropriate due to intensity of illness or inability to take PO   Dispo: The patient is from: ALF              Anticipated d/c is to: ALF  Anticipated d/c date is: 3 days              Patient currently is not medically stable to d/c.          Pressure injury documentation    None  Consultants  Palliative care  Procedures  None  Antibiotics    Anti-infectives (From admission, onward)   Start     Dose/Rate Route Frequency Ordered Stop   03/04/20 1100  azithromycin (ZITHROMAX) 500 mg in sodium chloride 0.9 % 250 mL IVPB  Status:  Discontinued        500 mg 250 mL/hr over 60 Minutes Intravenous Every 24 hours 03/03/20 1602 03/04/20 1430   03/03/20 1800  ceFEPIme (MAXIPIME) 2 g in sodium chloride 0.9 % 100 mL IVPB  Status:  Discontinued        2 g 200 mL/hr over 30 Minutes Intravenous Every 12 hours 03/03/20 1614 03/04/20 1430   03/03/20 1130  cefTRIAXone (ROCEPHIN) 1 g in sodium chloride 0.9 % 100 mL IVPB        1 g 200 mL/hr over 30 Minutes Intravenous  Once 03/03/20 1118 03/03/20 1242   03/03/20 1130  azithromycin (ZITHROMAX) tablet 500 mg        500 mg Oral  Once 03/03/20 1118 03/03/20 1135        Subjective   Continues to feel very fatigued and has some chest wall pain.  Family at bedside is very concerned about her prognosis but understands that she has limited life expectancy going forward.  No other issues  Objective   Vitals:   03/07/20 2040 03/08/20 0500 03/08/20 0504 03/08/20 1355  BP: 111/71  (!) 109/51 134/60  Pulse: 69  71 70  Resp: 16  16 16   Temp: 98.1 F (36.7 C)  97.6 F (36.4 C) 98.3 F (36.8 C)  TempSrc:    Oral  SpO2: 96%  97% 98%  Weight:  49.2 kg    Height:        Intake/Output Summary (Last 24 hours) at 03/08/2020 1359 Last data filed at 03/08/2020 0900 Gross per 24 hour  Intake 480 ml  Output 750 ml  Net -270 ml   Filed Weights   03/06/20 0439 03/07/20 0500 03/08/20 0500  Weight: 45.3 kg 50.5 kg 49.2 kg    Examination:  Physical Exam Vitals and nursing note reviewed. Exam conducted with a chaperone present.  Constitutional:      General: She is not in acute distress.    Appearance: She is not diaphoretic.  HENT:     Head: Normocephalic.     Mouth/Throat:     Mouth: Mucous membranes are moist.  Cardiovascular:     Rate and Rhythm: Normal rate and regular rhythm.    Pulmonary:     Effort: No respiratory distress.     Breath sounds: No stridor.     Comments: Poor effort Musculoskeletal:        General: No swelling.     Comments: Anterior chest wall pain  Neurological:     Mental Status: She is alert. Mental status is at baseline.  Psychiatric:        Mood and Affect: Mood normal.     Data Reviewed: I have personally reviewed following labs and imaging studies  CBC: Recent Labs  Lab 03/03/20 0837 03/04/20 0455 03/06/20 0409  WBC 4.9 5.0 4.7  NEUTROABS 2.9  --   --   HGB 11.2* 11.1* 11.5*  HCT 34.5* 33.1* 34.3*  MCV 103.6* 100.0 100.6*  PLT 216 235 761   Basic Metabolic Panel: Recent Labs  Lab 03/03/20 0837 03/04/20 0455 03/06/20 0409 03/07/20 0403 03/08/20 0554  NA 137 137 135 135 136  K 4.0 3.5 3.5 3.3* 3.1*  CL 105 101 97* 97* 99  CO2 24 25 28 28 27   GLUCOSE 101* 93 90 118* 105*  BUN 12 12 16 19 15   CREATININE 0.65 0.79 0.76 0.74 0.78  CALCIUM 8.3* 8.4* 8.4* 8.5* 8.5*  MG  --   --  1.9  --   --    GFR: Estimated Creatinine Clearance: 29 mL/min (by C-G formula based on SCr of 0.78 mg/dL). Liver Function Tests: No results for input(s): AST, ALT, ALKPHOS, BILITOT, PROT, ALBUMIN in the last 168 hours. No results for input(s): LIPASE, AMYLASE in the last 168 hours. No results for input(s): AMMONIA in the last 168 hours. Coagulation Profile: No results for input(s): INR, PROTIME in the last 168 hours. Cardiac Enzymes: No results for input(s): CKTOTAL, CKMB, CKMBINDEX, TROPONINI in the last 168 hours. BNP (last 3 results) No results for input(s): PROBNP in the last 8760 hours. HbA1C: No results for input(s): HGBA1C in the last 72 hours. CBG: No results for input(s): GLUCAP in the last 168 hours. Lipid Profile: No results for input(s): CHOL, HDL, LDLCALC, TRIG, CHOLHDL, LDLDIRECT in the last 72 hours. Thyroid Function Tests: No results for input(s): TSH, T4TOTAL, FREET4, T3FREE, THYROIDAB in the last 72 hours. Anemia  Panel: No results for input(s): VITAMINB12, FOLATE, FERRITIN, TIBC, IRON, RETICCTPCT in the last 72 hours. Sepsis Labs: Recent Labs  Lab 03/03/20 0837 03/07/20 0403  PROCALCITON <0.10 <0.10    Recent Results (from the past 240 hour(s))  SARS Coronavirus 2 by RT PCR (hospital order, performed in [facility] hospital lab) Nasopharyngeal Nasopharyngeal Swab     Status: None   Collection Time: 03/03/20  8:38 AM   Specimen: Nasopharyngeal Swab  Result Value Ref Range Status   SARS Coronavirus 2 NEGATIVE NEGATIVE Final    Comment: (NOTE) SARS-CoV-2 target nucleic acids are NOT DETECTED. The SARS-CoV-2 RNA is generally detectable in upper and lower respiratory specimens during the acute phase of infection. The lowest concentration of SARS-CoV-2 viral copies this assay can detect is 250 copies / mL. A negative result does not preclude SARS-CoV-2 infection and should not be used as the sole basis for treatment or other patient management decisions.  A negative result may occur with improper specimen collection / handling, submission of specimen other than nasopharyngeal swab, presence of viral mutation(s) within the areas targeted by this assay, and inadequate number of viral copies (<250 copies / mL). A negative result must be combined with clinical observations, patient history, and epidemiological information. Fact Sheet for Patients:   StrictlyIdeas.no Fact Sheet for Healthcare Providers: BankingDealers.co.za This test is not yet approved or cleared  by the [place] FDA and has been authorized for detection and/or diagnosis of SARS-CoV-2 by FDA under an Emergency Use Authorization (EUA).  This EUA will remain in effect (meaning this test can be used) for the duration of the COVID-19 declaration under Section 564(b)(1) of the Act, 21 U.S.C. section 360bbb-3(b)(1), unless the authorization is terminated or revoked sooner. Performed  at [facility], [place] [address] [place],  [address]          Radiology Studies: DG CHEST PORT 1 VIEW  Result Date: 03/07/2020 CLINICAL DATA:  Pleural effusion EXAM: PORTABLE CHEST 1 VIEW COMPARISON:  Radiograph  03/05/2020 FINDINGS: No significant interval change in the complex, loculated left pleural effusion with adjacent opacities in the left lung likely reflecting some associated atelectatic change. Pleural thickening in the right lung base may reflect a small right effusion as well. Hazy opacities in the aerated portions of the lungs likely reflecting a combination of atelectatic change and possible edema. Stable cardiomediastinal contours with a tortuous, calcified aorta, postsurgical changes from prior CABG and aortic valve replacement, mitral annular calcifications, and cardiomegaly. Stable positioning pacer leads at the right atrium and cardiac apex with pacer pack overlying the left chest wall. Severe degenerative changes in the shoulders. Multilevel degenerative changes are present in the imaged portions of the spine. No acute osseous or soft tissue abnormality. IMPRESSION: Persistent loculated left pleural effusion and small right effusion with adjacent passive atelectatic changes. Superimposed airspace disease is difficult to exclude. Electronically Signed   By: Lovena Le M.D.   On: 03/07/2020 07:14        Scheduled Meds: . aspirin EC  81 mg Oral Daily  . carvedilol  6.25 mg Oral Daily  . cholecalciferol  2,000 Units Oral Daily  . clopidogrel  75 mg Oral Daily  . dextromethorphan-guaiFENesin  1 tablet Oral BID  . enoxaparin (LOVENOX) injection  30 mg Subcutaneous Q24H  . furosemide  20 mg Intravenous BID  . ketotifen  1 drop Both Eyes BID  . potassium chloride  20 mEq Oral Once  . rosuvastatin  10 mg Oral QHS   Continuous Infusions:    Time spent: 36 minutes with over 50% of the time coordinating the patient's care    Harold Hedge,  DO [facility] Pager [phone]  Call night coverage person covering after 7pm

## 2020-03-09 DIAGNOSIS — R52 Pain, unspecified: Secondary | ICD-10-CM

## 2020-03-09 DIAGNOSIS — R531 Weakness: Secondary | ICD-10-CM

## 2020-03-09 DIAGNOSIS — Z515 Encounter for palliative care: Secondary | ICD-10-CM

## 2020-03-09 LAB — BASIC METABOLIC PANEL
Anion gap: 10 (ref 5–15)
BUN: 14 mg/dL (ref 8–23)
CO2: 28 mmol/L (ref 22–32)
Calcium: 8.8 mg/dL — ABNORMAL LOW (ref 8.9–10.3)
Chloride: 99 mmol/L (ref 98–111)
Creatinine, Ser: 0.6 mg/dL (ref 0.44–1.00)
GFR calc Af Amer: >60 mL/min (ref >60)
GFR calc non Af Amer: >60 mL/min (ref >60)
Glucose, Bld: 92 mg/dL (ref 70–99)
Potassium: 3.6 mmol/L (ref 3.5–5.1)
Sodium: 137 mmol/L (ref 135–145)

## 2020-03-09 NOTE — Progress Notes (Signed)
PROGRESS NOTE    Stephanie Cochran    Code Status: DNR  ZOX:[id number] DOB: Oct 20, 1917 DOA: 03/03/2020 LOS: 6 days  PCP: Cari Caraway, MD CC:  Chief Complaint  Patient presents with  . Cough       Hospital Summary   This is a 86 year old female with history of paroxysmal atrial fibrillation/flutter, CAD status post CABG, hypertension, hyperlipidemia, aortic stenosis s/p bioprosthetic valve replacement, breast cancer s/p left mastectomy, PVD, OA who was noted to have worsening dyspnea and cough with increased work of breathing and coarse breath sounds bilaterally on presentation.  In the WJ:XBJYNW 137, potassium 4.0, chloride 105, bicarb 24, glucose 101, BUN 12, creatinine 0.65, pro calcitonin less than 0.10, white count 4.9, hemoglobin 11.2, hematocrit 34.5, platelets 216.  SARS COVID-19 negative.  Chest radiograph with left pleural effusion and hilar vascular congestion, cephalization of the vasculature.  CT chest with bilateral pleural effusions, more left than right, left-sided loculation, faint bilateral groundglass opacities.  Dependent/compressive atelectasis.  EKG 70 bpm,  V paced, 100%, with secondary repolarization changes.  Palliative care consulted: Hospice referral once discharged      A & P   Principal Problem:   Pulmonary edema, acute, with congestive heart disease (HCC) Active Problems:   Coronary artery disease   History of aortic valve replacement with bioprosthetic valve   Hypertension   Hyperlipidemia   Complete heart block (HCC)   CHF (congestive heart failure) (HCC)   AF (atrial fibrillation) (HCC)   S/P AVR (aortic valve replacement)   Pleural effusion   1. Dyspnea secondary to atelectasis and acute cardiogenic pulmonary edema and left loculated pleural effusion secondary to acute on chronic diastolic heart failure exacerbation, not in respiratory failure a. Tolerating room air, BNP 171 on admission minimally elevated b. EF 60 to 65% with moderate LVH and  moderately reduced RV systolic function, severe mitral calcification and mild MR, mild to moderate TR c. not a good candidate for thoracentesis d. Daily weight and I/O e. Incentive spirometer f. Palliative consulted: hospice consult and follow up at her ALF on discharge g. Unfortunately this is likely going to be difficult to control as the loculation will not be able to be surgically intervened upon.  h. Mucinex i. Continue IV twice daily Lasix j. Out of bed  2. Paroxysmal atrial fibrillation with CHB s/p pacer, stable  3. RUQ pain of unknown significance a. RUQ ultrasound unremarkable  4. Hypertension/hyperlipidemia/CAD a. Continue Coreg, aspirin, Plavix, statin  5. AS s/p valve replacement a. Continue diuresis  6. History of breast cancer, stable  7. Hypokalemia,  a. Replace through IV as she cannot swallow potassium tablets  8. Musculoskeletal chest wall pain from coughing a. Continue Mucinex b. Continue Tylenol  9. Dementia a. May be developing hospital-acquired delirium b. Delirium precautions  DVT prophylaxis: Lovenox Family Communication: No family at bedside today Disposition Plan: Plan for discharge on Tuesday on p.o. Lasix after continued IV diuresis back to ALF with hospice services to follow-up Status is: Inpatient  Remains inpatient appropriate because:IV treatments appropriate due to intensity of illness or inability to take PO   Dispo: The patient is from: ALF              Anticipated d/c is to: ALF              Anticipated d/c date is: 3 days              Patient currently is not medically stable to d/c.  Pressure injury documentation    None  Consultants  Palliative care  Procedures  None  Antibiotics   Anti-infectives (From admission, onward)   Start     Dose/Rate Route Frequency Ordered Stop   03/04/20 1100  azithromycin (ZITHROMAX) 500 mg in sodium chloride 0.9 % 250 mL IVPB  Status:  Discontinued        500 mg 250 mL/hr over 60  Minutes Intravenous Every 24 hours 03/03/20 1602 03/04/20 1430   03/03/20 1800  ceFEPIme (MAXIPIME) 2 g in sodium chloride 0.9 % 100 mL IVPB  Status:  Discontinued        2 g 200 mL/hr over 30 Minutes Intravenous Every 12 hours 03/03/20 1614 03/04/20 1430   03/03/20 1130  cefTRIAXone (ROCEPHIN) 1 g in sodium chloride 0.9 % 100 mL IVPB        1 g 200 mL/hr over 30 Minutes Intravenous  Once 03/03/20 1118 03/03/20 1242   03/03/20 1130  azithromycin (ZITHROMAX) tablet 500 mg        500 mg Oral  Once 03/03/20 1118 03/03/20 1135        Subjective   Continues to have nonspecific generalized complaints of discomfort but unable to specify. no overnight events.  Objective   Vitals:   03/08/20 1355 03/08/20 2024 03/09/20 0704 03/09/20 1330  BP: 134/60 133/69 (!) 122/57 102/60  Pulse: 70 70 70 70  Resp: 16 18 16 16   Temp: 98.3 F (36.8 C) 98.6 F (37 C) 97.9 F (36.6 C) 98.7 F (37.1 C)  TempSrc: Oral Oral Oral Oral  SpO2: 98% 96% 98% 97%  Weight:      Height:        Intake/Output Summary (Last 24 hours) at 03/09/2020 1448 Last data filed at 03/09/2020 1100 Gross per 24 hour  Intake 625 ml  Output 1200 ml  Net -575 ml   Filed Weights   03/06/20 0439 03/07/20 0500 03/08/20 0500  Weight: 45.3 kg 50.5 kg 49.2 kg    Examination:  Physical Exam Vitals and nursing note reviewed.  Constitutional:      General: She is not in acute distress.    Appearance: Normal appearance.  HENT:     Head: Normocephalic and atraumatic.  Eyes:     Conjunctiva/sclera: Conjunctivae normal.  Cardiovascular:     Rate and Rhythm: Normal rate and regular rhythm.  Pulmonary:     Effort: Pulmonary effort is normal.  Abdominal:     General: Abdomen is flat.     Palpations: Abdomen is soft.  Musculoskeletal:        General: No swelling or tenderness.  Skin:    Coloration: Skin is not jaundiced or pale.  Neurological:     Mental Status: She is alert. Mental status is at baseline.     Comments:  Pleasantly confused, at baseline  Psychiatric:        Mood and Affect: Mood normal.        Behavior: Behavior normal.     Data Reviewed: I have personally reviewed following labs and imaging studies  CBC: Recent Labs  Lab 03/03/20 0837 03/04/20 0455 03/06/20 0409  WBC 4.9 5.0 4.7  NEUTROABS 2.9  --   --   HGB 11.2* 11.1* 11.5*  HCT 34.5* 33.1* 34.3*  MCV 103.6* 100.0 100.6*  PLT 216 235 784   Basic Metabolic Panel: Recent Labs  Lab 03/04/20 0455 03/06/20 0409 03/07/20 0403 03/08/20 0554 03/09/20 0641  NA 137 135 135 136 137  K  3.5 3.5 3.3* 3.1* 3.6  CL 101 97* 97* 99 99  CO2 25 28 28 27 28   GLUCOSE 93 90 118* 105* 92  BUN 12 16 19 15 14   CREATININE 0.79 0.76 0.74 0.78 0.60  CALCIUM 8.4* 8.4* 8.5* 8.5* 8.8*  MG  --  1.9  --   --   --    GFR: Estimated Creatinine Clearance: 29 mL/min (by C-G formula based on SCr of 0.6 mg/dL). Liver Function Tests: No results for input(s): AST, ALT, ALKPHOS, BILITOT, PROT, ALBUMIN in the last 168 hours. No results for input(s): LIPASE, AMYLASE in the last 168 hours. No results for input(s): AMMONIA in the last 168 hours. Coagulation Profile: No results for input(s): INR, PROTIME in the last 168 hours. Cardiac Enzymes: No results for input(s): CKTOTAL, CKMB, CKMBINDEX, TROPONINI in the last 168 hours. BNP (last 3 results) No results for input(s): PROBNP in the last 8760 hours. HbA1C: No results for input(s): HGBA1C in the last 72 hours. CBG: No results for input(s): GLUCAP in the last 168 hours. Lipid Profile: No results for input(s): CHOL, HDL, LDLCALC, TRIG, CHOLHDL, LDLDIRECT in the last 72 hours. Thyroid Function Tests: No results for input(s): TSH, T4TOTAL, FREET4, T3FREE, THYROIDAB in the last 72 hours. Anemia Panel: No results for input(s): VITAMINB12, FOLATE, FERRITIN, TIBC, IRON, RETICCTPCT in the last 72 hours. Sepsis Labs: Recent Labs  Lab 03/03/20 0837 03/07/20 0403  PROCALCITON <0.10 <0.10    Recent  Results (from the past 240 hour(s))  SARS Coronavirus 2 by RT PCR (hospital order, performed in [facility] hospital lab) Nasopharyngeal Nasopharyngeal Swab     Status: None   Collection Time: 03/03/20  8:38 AM   Specimen: Nasopharyngeal Swab  Result Value Ref Range Status   SARS Coronavirus 2 NEGATIVE NEGATIVE Final    Comment: (NOTE) SARS-CoV-2 target nucleic acids are NOT DETECTED. The SARS-CoV-2 RNA is generally detectable in upper and lower respiratory specimens during the acute phase of infection. The lowest concentration of SARS-CoV-2 viral copies this assay can detect is 250 copies / mL. A negative result does not preclude SARS-CoV-2 infection and should not be used as the sole basis for treatment or other patient management decisions.  A negative result may occur with improper specimen collection / handling, submission of specimen other than nasopharyngeal swab, presence of viral mutation(s) within the areas targeted by this assay, and inadequate number of viral copies (<250 copies / mL). A negative result must be combined with clinical observations, patient history, and epidemiological information. Fact Sheet for Patients:   StrictlyIdeas.no Fact Sheet for Healthcare Providers: BankingDealers.co.za This test is not yet approved or cleared  by the [place] FDA and has been authorized for detection and/or diagnosis of SARS-CoV-2 by FDA under an Emergency Use Authorization (EUA).  This EUA will remain in effect (meaning this test can be used) for the duration of the COVID-19 declaration under Section 564(b)(1) of the Act, 21 U.S.C. section 360bbb-3(b)(1), unless the authorization is terminated or revoked sooner. Performed at [facility], [place] [address], [place], [place] [address]          Radiology Studies: No results found.      Scheduled Meds: . aspirin EC  81 mg Oral Daily  . carvedilol   6.25 mg Oral Daily  . cholecalciferol  2,000 Units Oral Daily  . clopidogrel  75 mg Oral Daily  . dextromethorphan-guaiFENesin  1 tablet Oral BID  . enoxaparin (LOVENOX) injection  30 mg Subcutaneous  Q24H  . furosemide  20 mg Intravenous BID  . ketotifen  1 drop Both Eyes BID  . potassium chloride  20 mEq Oral Once  . rosuvastatin  10 mg Oral QHS   Continuous Infusions:    Time spent: 25 minutes with over 50% of the time coordinating the patient's care    Harold Hedge, DO [facility] Pager [phone]  Call night coverage person covering after 7pm

## 2020-03-09 NOTE — Progress Notes (Signed)
Daily Progress Note   Patient Name: Stephanie Cochran       Date: 03/09/2020 DOB: 03-25-1918  Age: 86 y.o. MRN#: [id number] Attending Physician: Harold Hedge, MD Primary Care Physician: Cari Caraway, MD Admit Date: 03/03/2020  Reason for Consultation/Follow-up: Establishing goals of care  Subjective: Patient is awake alert sitting up in bed She complains of generalized weakness, feels tired today. She is looking forward to simply resting in bed and watching some television today, she denies chest pain currently.   Length of Stay: 6  Current Medications: Scheduled Meds:  . aspirin EC  81 mg Oral Daily  . carvedilol  6.25 mg Oral Daily  . cholecalciferol  2,000 Units Oral Daily  . clopidogrel  75 mg Oral Daily  . dextromethorphan-guaiFENesin  1 tablet Oral BID  . enoxaparin (LOVENOX) injection  30 mg Subcutaneous Q24H  . furosemide  20 mg Intravenous BID  . ketotifen  1 drop Both Eyes BID  . potassium chloride  20 mEq Oral Once  . rosuvastatin  10 mg Oral QHS    Continuous Infusions:   PRN Meds: acetaminophen, ondansetron **OR** ondansetron (ZOFRAN) IV, polyvinyl alcohol, senna-docusate  Physical Exam         Awake alert Admits to feeling tired.   Regular work of breathing S1 S2 Abdomen soft non tender No edema  Vital Signs: BP (!) 122/57 (BP Location: Right Arm)   Pulse 70   Temp 97.9 F (36.6 C) (Oral)   Resp 16   Ht 5\' 3"  (1.6 m)   Wt 49.2 kg   SpO2 98%   BMI 19.21 kg/m  SpO2: SpO2: 98 % O2 Device: O2 Device: Room Air O2 Flow Rate:    Intake/output summary:   Intake/Output Summary (Last 24 hours) at 03/09/2020 1141 Last data filed at 03/09/2020 1100 Gross per 24 hour  Intake 385 ml  Output 1200 ml  Net -815 ml   LBM: Last BM Date: 03/03/20 Baseline  Weight: Weight: 52 kg Most recent weight: Weight: 49.2 kg       Palliative Assessment/Data:      Patient Active Problem List   Diagnosis Date Noted  . Pulmonary edema, acute, with congestive heart disease ([place]) 03/04/2020  . Pleural effusion   . Acute respiratory failure ([place]) 03/03/2020  . AF (atrial fibrillation) ([place])  12/23/2016  . S/P AVR (aortic valve replacement) 12/23/2016  . FTT (failure to thrive) in adult 12/23/2016  . Near syncope 12/14/2016  . Acute encephalopathy 12/14/2016  . Elevated troponin 12/14/2016  . Anemia 12/14/2016  . Acute ischemic left MCA stroke ([place])   . Encephalopathy   . CHF (congestive heart failure) ([place]) 12/09/2015  . Risk for falls 12/09/2015  . History of recent fall 12/09/2015  . Complete heart block ([place]) 05/13/2014  . AV block, 2nd degree 05/13/2014  . Atherosclerosis of renal artery ([place]) 01/04/2012  . Lower extremity edema 11/11/2011  . Coronary artery disease 11/11/2011  . History of aortic valve replacement with bioprosthetic valve 11/11/2011  . History of renal artery stenosis 11/11/2011  . Hypertension 11/11/2011  . Hyperlipidemia 11/11/2011    Palliative Care Assessment & Plan   Patient Profile:  86 y.o. female  with past medical history of   paroxysmal atrial fibrillation/flutter, CAD status post CABG, hypertension, hyperlipidemia, aortic stenosis s/p bioprosthetic valve replacement, breast cancer s/p left mastectomy, PVD, OA,admitted on 03/03/2020 with worsening dyspnea and cough with increased work of breathing and coarse breath sounds bilaterally on presentation.    Assessment:  Patient has been admitted to hospital medicine service from her facility [facility] due to dyspnea, acute cardiogenic pulmonary edema L loculated pleural effusion, deemed secondary to acute on chronic diastolic heart failure exacerbation. She has history of pacemaker implantation. She has been placed on diuretic regimen here.   A palliative  consult has been requested for ongoing goals of care discussions.   Recommendations/Plan:   continue current mode of care.  Recommend hospice follow at facility at discharge.    Goals of Care and Additional Recommendations:  Limitations on Scope of Treatment:   Code Status:    Code Status Orders  (From admission, onward)         Start     Ordered   03/03/20 1141  Do not attempt resuscitation (DNR)  Continuous       Question Answer Comment  In the event of cardiac or respiratory ARREST Do not call a "code blue"   In the event of cardiac or respiratory ARREST Do not perform Intubation, CPR, defibrillation or ACLS   In the event of cardiac or respiratory ARREST Use medication by any route, position, wound care, and other measures to relive pain and suffering. May use oxygen, suction and manual treatment of airway obstruction as needed for comfort.      03/03/20 1151        Code Status History    Date Active Date Inactive Code Status Order ID Comments User Context   12/14/2016 1410 12/18/2016 1846 DNR [id number]  Dionne Milo, NP ED   12/14/2016 1410 12/14/2016 1410 DNR [id number]  Dionne Milo, NP ED   05/14/2014 1659 05/15/2014 1736 Full Code [id number]  [name], MD Inpatient   05/13/2014 1902 05/14/2014 1659 Full Code [id number]  Burnell Blanks, MD Inpatient   Advance Care Planning Activity    Advance Directive Documentation     Most Recent Value  Type of Advance Directive Out of facility DNR (pink MOST or yellow form)  Pre-existing out of facility DNR order (yellow form or pink MOST form) Yellow form placed in chart (order not valid for inpatient use)  "MOST" Form in Place? --       Prognosis:   < 6 months  Discharge Planning:  ALF with hospice on discharge.   Care plan was discussed with  Patient   Thank you for allowing the Palliative Medicine Team to assist in the care of this patient.   Time In: 9 Time Out: 9.25 Total Time 25 Prolonged  Time Billed No       Greater than 50%  of this time was spent counseling and coordinating care related to the above assessment and plan.  Loistine Chance, MD  Please contact Palliative Medicine Team phone at [phone] for questions and concerns.

## 2020-03-10 MED ORDER — FUROSEMIDE 20 MG PO TABS: 20.0000 mg | ORAL_TABLET | Freq: Every day | ORAL | 1 refills | Status: AC

## 2020-03-10 MED ORDER — FUROSEMIDE 20 MG PO TABS
20.0000 mg | ORAL_TABLET | Freq: Two times a day (BID) | ORAL | Status: DC
  Administered 2020-03-10: 20 mg via ORAL
  Filled 2020-03-10: qty 1

## 2020-03-10 NOTE — Progress Notes (Signed)
Report  Called to [place] at this time and given to [place].

## 2020-03-10 NOTE — TOC Transition Note (Signed)
Transition of Care [facility]) - CM/SW Discharge Note   Patient Details  Name: Stephanie Cochran MRN: [id number] Date of Birth: [birth year]  [facility] CM/SW Contact:  Trish Mage, LCSW Phone Number: 03/10/2020, 2:15 PM   Clinical Narrative:   Patient to return to [facility] today.  Family alerted. PTAR arranged. Referral to [facility] for "hospice consultation."  Nursing, please call report to [phone], room 23, ask for hall 2 med tech.  TOC sign off.   Final next level of care: Assisted Living Barriers to Discharge: No Barriers Identified   Patient Goals and CMS Choice Patient states their goals for this hospitalization and ongoing recovery are:: Unable to say      Discharge Placement                       Discharge Plan and Services   Discharge Planning Services: CM Consult Post Acute Care Choice: Resumption of Svcs/PTA Provider, Home Health                               Social Determinants of Health (SDOH) Interventions     Readmission Risk Interventions No flowsheet data found.

## 2020-03-10 NOTE — Plan of Care (Signed)

## 2020-03-10 NOTE — Progress Notes (Signed)
PT Cancellation Note  Patient Details Name: Stephanie Cochran MRN: [id number] DOB: 1917-11-04   Cancelled Treatment:    Reason Eval/Treat Not Completed: Patient's level of consciousness;Other (comment) (RN reports pt requrested to rest this AM and has been feeling very tired. Pt asleep when PT arrived, will follow up at later time/date once pt is awake and rested.)  Verner Mould, [place]  Office [phone] Pager [phone]  03/10/2020 12:41 PM

## 2020-03-10 NOTE — Progress Notes (Signed)
PMT no charge note  Patient seen over the weekend, chart reviewed today, no additional PMT specific recommendations at this time, other than for hospice consultation at her facility. Continue PRN Tylenol, also on ASA. Continue attempts at diuresis and current mode of care.   Loistine Chance MD [facility] [phone].

## 2020-03-10 NOTE — NC FL2 (Signed)
[place] MEDICAID FL2 LEVEL OF CARE SCREENING TOOL     IDENTIFICATION  Patient Name: Stephanie Cochran Birthdate: [birth year] Sex: female Admission Date (Current Location): 03/03/2020  [facility] and [place] Number:  Herbalist and Address:  [facility],  [place] [address], [place]      Provider Number: [id number]  Attending Physician Name and Address:  Harold Hedge, MD  Relative Name and Phone Number:       Current Level of Care: Hospital Recommended Level of Care: [place] Prior Approval Number:    Date Approved/Denied:   PASRR Number:    Discharge Plan: Domiciliary (Rest home)    Current Diagnoses: Patient Active Problem List   Diagnosis Date Noted  . Pulmonary edema, acute, with congestive heart disease ([place]) 03/04/2020  . Pleural effusion   . Acute respiratory failure ([place]) 03/03/2020  . AF (atrial fibrillation) ([place]) 12/23/2016  . S/P AVR (aortic valve replacement) 12/23/2016  . FTT (failure to thrive) in adult 12/23/2016  . Near syncope 12/14/2016  . Acute encephalopathy 12/14/2016  . Elevated troponin 12/14/2016  . Anemia 12/14/2016  . Acute ischemic left MCA stroke ([place])   . Encephalopathy   . CHF (congestive heart failure) ([place]) 12/09/2015  . Risk for falls 12/09/2015  . History of recent fall 12/09/2015  . Complete heart block ([place]) 05/13/2014  . AV block, 2nd degree 05/13/2014  . Atherosclerosis of renal artery ([place]) 01/04/2012  . Lower extremity edema 11/11/2011  . Coronary artery disease 11/11/2011  . History of aortic valve replacement with bioprosthetic valve 11/11/2011  . History of renal artery stenosis 11/11/2011  . Hypertension 11/11/2011  . Hyperlipidemia 11/11/2011    Orientation RESPIRATION BLADDER Height & Weight     Self  Normal Continent Weight: 49.2 kg Height:  5\' 3"  (160 cm)  BEHAVIORAL SYMPTOMS/MOOD NEUROLOGICAL BOWEL NUTRITION STATUS   (none)  (none) Continent Diet (No added salt)   AMBULATORY STATUS COMMUNICATION OF NEEDS Skin   Extensive Assist Verbally Normal                       Personal Care Assistance Level of Assistance  Bathing, Feeding, Dressing Bathing Assistance: Maximum assistance Feeding assistance: Limited assistance Dressing Assistance: Maximum assistance     Functional Limitations Info  Sight, Hearing, Speech Sight Info: Adequate Hearing Info: Adequate Speech Info: Adequate    SPECIAL CARE FACTORS FREQUENCY  PT (By licensed PT), OT (By licensed OT)     PT Frequency: 2X/W OT Frequency: 2X/W            Contractures Contractures Info: Not present    Additional Factors Info  Code Status, Allergies Code Status Info: DNR Allergies Info: Codeine, Vicodin, Penicillins           Current Medications (03/10/2020):  This is the current hospital active medication list Current Facility-Administered Medications  Medication Dose Route Frequency Provider Last Rate Last Admin  . acetaminophen (TYLENOL) tablet 500 mg  500 mg Oral Q6H PRN Harold Hedge, MD   500 mg at 03/09/20 1536  . aspirin EC tablet 81 mg  81 mg Oral Daily Little Ishikawa, MD   81 mg at 03/10/20 1012  . carvedilol (COREG) tablet 6.25 mg  6.25 mg Oral Daily Little Ishikawa, MD   6.25 mg at 03/10/20 1011  . cholecalciferol (VITAMIN D3) tablet 2,000 Units  2,000 Units Oral Daily Little Ishikawa, MD   2,000 Units at 03/10/20  1011  . clopidogrel (PLAVIX) tablet 75 mg  75 mg Oral Daily Little Ishikawa, MD   75 mg at 03/10/20 1011  . dextromethorphan-guaiFENesin (MUCINEX DM) 30-600 MG per 12 hr tablet 1 tablet  1 tablet Oral BID Harold Hedge, MD   1 tablet at 03/10/20 1012  . enoxaparin (LOVENOX) injection 30 mg  30 mg Subcutaneous Q24H Arrien, Jimmy Picket, MD   30 mg at 03/09/20 2137  . furosemide (LASIX) tablet 20 mg  20 mg Oral BID Harold Hedge, MD   20 mg at 03/10/20 1014  . ketotifen (ZADITOR) 0.025 % ophthalmic solution 1 drop  1 drop Both Eyes  BID Little Ishikawa, MD   1 drop at 03/10/20 1012  . ondansetron (ZOFRAN) tablet 4 mg  4 mg Oral Q6H PRN Little Ishikawa, MD   4 mg at 03/05/20 1054   Or  . ondansetron (ZOFRAN) injection 4 mg  4 mg Intravenous Q6H PRN Little Ishikawa, MD   4 mg at 03/10/20 1026  . polyvinyl alcohol (LIQUIFILM TEARS) 1.4 % ophthalmic solution 1 drop  1 drop Both Eyes BID PRN Little Ishikawa, MD      . rosuvastatin (CRESTOR) tablet 10 mg  10 mg Oral QHS Little Ishikawa, MD   10 mg at 03/09/20 2137  . senna-docusate (Senokot-S) tablet 1 tablet  1 tablet Oral QHS PRN Little Ishikawa, MD   1 tablet at 03/09/20 2137     Discharge Medications: Please see discharge summary for a list of discharge medications.  Relevant Imaging Results:  Relevant Lab Results:   Additional Information SSN:  [id number]  Trish Mage, LCSW

## 2020-03-10 NOTE — Care Management Important Message (Signed)
Important Message  Patient Details IM Letter given to Roque Lias SW Case Manager to present to the Patient Name: Stephanie Cochran MRN: [id number] Date of Birth: Oct 13, 1917   Medicare Important Message Given:  Yes     Kerin Salen 03/10/2020, 10:19 AM

## 2020-03-10 NOTE — Discharge Summary (Signed)
Physician Discharge Summary  Stephanie Cochran:[id number] DOB: April 09, 1918   PCP: Cari Caraway, MD  Admit date: 03/03/2020 Discharge date: 03/10/2020 Length of Stay: 7 days   Code Status: DNR  Admitted From: ALF Discharged to:  ALF  Home Health: PT/OT  Equipment/Devices:  None Discharge Condition:  Stable  Recommendations for Outpatient Follow-up   1. Follow up with PCP in 1 week 2. Follow up BMP  3. Hospice consultation at [facility]  This is a 86 year old female with history of paroxysmal atrial fibrillation/flutter, CAD status post CABG, hypertension, hyperlipidemia, aortic stenosis s/p bioprosthetic valve replacement, breast cancer s/p left mastectomy, PVD, OA who was noted to have worsening dyspnea and cough with increased work of breathing and coarse breath sounds bilaterally on presentation.  In the BD:ZHGDJM 137, potassium 4.0, chloride 105, bicarb 24, glucose 101, BUN 12, creatinine 0.65, pro calcitonin less than 0.10, white count 4.9, hemoglobin 11.2, hematocrit 34.5,platelets 216. SARS COVID-19 negative. Chest radiograph with left pleural effusion and hilar vascular congestion, cephalization of the vasculature. CT chest with bilateral pleural effusions, more left than right, left-sided loculation, faint bilateral groundglass opacities. Dependent/compressive atelectasis. EKG 70 bpm, Vpaced, 100%,with secondary repolarization changes.  She was not deemed a thoracentesis/surgical candidate for her loculated pleural effusion. She had symptomatic improvement with Lasix IV BID diuresis. Started on Lasix PO at discharge. Palliative care consulted: Hospice consult at ALF.   A & P   Principal Problem:   Pulmonary edema, acute, with congestive heart disease (HCC) Active Problems:   Coronary artery disease   History of aortic valve replacement with bioprosthetic valve   Hypertension   Hyperlipidemia   Complete heart block (HCC)   CHF (congestive heart  failure) (HCC)   AF (atrial fibrillation) (HCC)   S/P AVR (aortic valve replacement)   Pleural effusion   1. Dyspnea secondary to atelectasis and acute cardiogenic pulmonary edema and left loculated pleural effusion secondary to acute on chronic diastolic heart failure exacerbation, not in respiratory failure a. Tolerating room air, BNP 171 on admission minimally elevated b. EF 60 to 65% with moderate LVH and moderately reduced RV systolic function, severe mitral calcification and mild MR, mild to moderate TR c. not a good candidate for thoracentesis d. Incentive spirometer e. Palliative consulted: hospice consult and follow up at her ALF on discharge f. Unfortunately this is likely going to be difficult to control as the loculation will not be able to be surgically intervened upon.  g. Started on Lasix 20 mg p.o. daily with additional as needed 20 mg for volume overload h. Out of bed as tolerated  2. Paroxysmal atrial fibrillation with CHB s/p pacer, stable  3. RUQ pain of unknown significance a. RUQ ultrasound unremarkable  4. Hypertension/hyperlipidemia/CAD a. Continue Coreg, aspirin, Plavix, statin  5. AS s/p valve replacement a. As above  6. History of breast cancer, stable  7. Hypokalemia,  a. Replace through IV as she cannot swallow potassium tablets  8. Musculoskeletal chest wall pain from coughing a. Continue Tylenol  9. Dementia a. Would be better for her to be at a familiar facility been in the hospital at this time    Consultants  . Palliative  Procedures  . None  Antibiotics   Anti-infectives (From admission, onward)   Start     Dose/Rate Route Frequency Ordered Stop   03/04/20 1100  azithromycin (ZITHROMAX) 500 mg in sodium chloride 0.9 % 250 mL IVPB  Status:  Discontinued  500 mg 250 mL/hr over 60 Minutes Intravenous Every 24 hours 03/03/20 1602 03/04/20 1430   03/03/20 1800  ceFEPIme (MAXIPIME) 2 g in sodium chloride 0.9 % 100 mL IVPB   Status:  Discontinued        2 g 200 mL/hr over 30 Minutes Intravenous Every 12 hours 03/03/20 1614 03/04/20 1430   03/03/20 1130  cefTRIAXone (ROCEPHIN) 1 g in sodium chloride 0.9 % 100 mL IVPB        1 g 200 mL/hr over 30 Minutes Intravenous  Once 03/03/20 1118 03/03/20 1242   03/03/20 1130  azithromycin (ZITHROMAX) tablet 500 mg        500 mg Oral  Once 03/03/20 1118 03/03/20 1135       Subjective  Family at bedside states patient has been sleeping throughout the day and has been sleeping for the past several days and awakens occasionally.  Otherwise no issues overnight and no acute events.  I discussed case with the patient's family over the phone who are on their way from out of town.   Objective   Discharge Exam: Vitals:   03/09/20 2048 03/10/20 0553  BP: (!) 114/53 (!) 105/55  Pulse: 70 70  Resp: 20 20  Temp: 98.2 F (36.8 C) 98.2 F (36.8 C)  SpO2: 97% 96%   Vitals:   03/09/20 1330 03/09/20 1505 03/09/20 2048 03/10/20 0553  BP: 102/60  (!) 114/53 (!) 105/55  Pulse: 70  70 70  Resp: 16  20 20   Temp: 98.7 F (37.1 C)  98.2 F (36.8 C) 98.2 F (36.8 C)  TempSrc: Oral  Oral Oral  SpO2: 97% 96% 97% 96%  Weight:      Height:        Physical Exam Vitals and nursing note reviewed. Exam conducted with a chaperone present.  Constitutional:      General: She is not in acute distress.    Appearance: She is not ill-appearing.     Comments: Sleeping comfortably but arousable  Eyes:     Conjunctiva/sclera: Conjunctivae normal.  Cardiovascular:     Rate and Rhythm: Normal rate and regular rhythm.  Pulmonary:     Effort: Pulmonary effort is normal.     Breath sounds: Normal breath sounds.  Abdominal:     General: Abdomen is flat. There is no distension.  Musculoskeletal:        General: No swelling or tenderness.  Neurological:     Mental Status: Mental status is at baseline.  Psychiatric:        Mood and Affect: Mood normal.        Behavior: Behavior normal.        The results of significant diagnostics from this hospitalization (including imaging, microbiology, ancillary and laboratory) are listed below for reference.     Microbiology: Recent Results (from the past 240 hour(s))  SARS Coronavirus 2 by RT PCR (hospital order, performed in [facility] hospital lab) Nasopharyngeal Nasopharyngeal Swab     Status: None   Collection Time: 03/03/20  8:38 AM   Specimen: Nasopharyngeal Swab  Result Value Ref Range Status   SARS Coronavirus 2 NEGATIVE NEGATIVE Final    Comment: (NOTE) SARS-CoV-2 target nucleic acids are NOT DETECTED. The SARS-CoV-2 RNA is generally detectable in upper and lower respiratory specimens during the acute phase of infection. The lowest concentration of SARS-CoV-2 viral copies this assay can detect is 250 copies / mL. A negative result does not preclude SARS-CoV-2 infection and should not be used  as the sole basis for treatment or other patient management decisions.  A negative result may occur with improper specimen collection / handling, submission of specimen other than nasopharyngeal swab, presence of viral mutation(s) within the areas targeted by this assay, and inadequate number of viral copies (<250 copies / mL). A negative result must be combined with clinical observations, patient history, and epidemiological information. Fact Sheet for Patients:   StrictlyIdeas.no Fact Sheet for Healthcare Providers: BankingDealers.co.za This test is not yet approved or cleared  by the [place] FDA and has been authorized for detection and/or diagnosis of SARS-CoV-2 by FDA under an Emergency Use Authorization (EUA).  This EUA will remain in effect (meaning this test can be used) for the duration of the COVID-19 declaration under Section 564(b)(1) of the Act, 21 U.S.C. section 360bbb-3(b)(1), unless the authorization is terminated or revoked sooner. Performed at [facility], [place] [address] [place], [place] [address]      Labs: BNP (last 3 results) Recent Labs    03/03/20 0837  BNP 101.7*   Basic Metabolic Panel: Recent Labs  Lab 03/04/20 0455 03/06/20 0409 03/07/20 0403 03/08/20 0554 03/09/20 0641  NA 137 135 135 136 137  K 3.5 3.5 3.3* 3.1* 3.6  CL 101 97* 97* 99 99  CO2 25 28 28 27 28   GLUCOSE 93 90 118* 105* 92  BUN 12 16 19 15 14   CREATININE 0.79 0.76 0.74 0.78 0.60  CALCIUM 8.4* 8.4* 8.5* 8.5* 8.8*  MG  --  1.9  --   --   --    Liver Function Tests: No results for input(s): AST, ALT, ALKPHOS, BILITOT, PROT, ALBUMIN in the last 168 hours. No results for input(s): LIPASE, AMYLASE in the last 168 hours. No results for input(s): AMMONIA in the last 168 hours. CBC: Recent Labs  Lab 03/04/20 0455 03/06/20 0409  WBC 5.0 4.7  HGB 11.1* 11.5*  HCT 33.1* 34.3*  MCV 100.0 100.6*  PLT 235 233   Cardiac Enzymes: No results for input(s): CKTOTAL, CKMB, CKMBINDEX, TROPONINI in the last 168 hours. BNP: Invalid input(s): POCBNP CBG: No results for input(s): GLUCAP in the last 168 hours. D-Dimer No results for input(s): DDIMER in the last 72 hours. Hgb A1c No results for input(s): HGBA1C in the last 72 hours. Lipid Profile No results for input(s): CHOL, HDL, LDLCALC, TRIG, CHOLHDL, LDLDIRECT in the last 72 hours. Thyroid function studies No results for input(s): TSH, T4TOTAL, T3FREE, THYROIDAB in the last 72 hours.  Invalid input(s): FREET3 Anemia work up No results for input(s): VITAMINB12, FOLATE, FERRITIN, TIBC, IRON, RETICCTPCT in the last 72 hours. Urinalysis    Component Value Date/Time   COLORURINE STRAW (A) 12/14/2016 1109   APPEARANCEUR CLEAR 12/14/2016 1109   LABSPEC 1.008 12/14/2016 1109   PHURINE 7.0 12/14/2016 1109   GLUCOSEU NEGATIVE 12/14/2016 1109   HGBUR NEGATIVE 12/14/2016 1109   BILIRUBINUR NEGATIVE 12/14/2016 1109   KETONESUR NEGATIVE 12/14/2016 1109   PROTEINUR NEGATIVE  12/14/2016 1109   UROBILINOGEN 0.2 05/13/2014 1822   NITRITE NEGATIVE 12/14/2016 1109   LEUKOCYTESUR NEGATIVE 12/14/2016 1109   Sepsis Labs Invalid input(s): PROCALCITONIN,  WBC,  LACTICIDVEN Microbiology Recent Results (from the past 240 hour(s))  SARS Coronavirus 2 by RT PCR (hospital order, performed in [facility] hospital lab) Nasopharyngeal Nasopharyngeal Swab     Status: None   Collection Time: 03/03/20  8:38 AM   Specimen: Nasopharyngeal Swab  Result Value Ref Range Status   SARS Coronavirus 2  NEGATIVE NEGATIVE Final    Comment: (NOTE) SARS-CoV-2 target nucleic acids are NOT DETECTED. The SARS-CoV-2 RNA is generally detectable in upper and lower respiratory specimens during the acute phase of infection. The lowest concentration of SARS-CoV-2 viral copies this assay can detect is 250 copies / mL. A negative result does not preclude SARS-CoV-2 infection and should not be used as the sole basis for treatment or other patient management decisions.  A negative result may occur with improper specimen collection / handling, submission of specimen other than nasopharyngeal swab, presence of viral mutation(s) within the areas targeted by this assay, and inadequate number of viral copies (<250 copies / mL). A negative result must be combined with clinical observations, patient history, and epidemiological information. Fact Sheet for Patients:   StrictlyIdeas.no Fact Sheet for Healthcare Providers: BankingDealers.co.za This test is not yet approved or cleared  by the [place] FDA and has been authorized for detection and/or diagnosis of SARS-CoV-2 by FDA under an Emergency Use Authorization (EUA).  This EUA will remain in effect (meaning this test can be used) for the duration of the COVID-19 declaration under Section 564(b)(1) of the Act, 21 U.S.C. section 360bbb-3(b)(1), unless the authorization is terminated or revoked  sooner. Performed at [facility], [place] [address], [place], [place] [address]     Discharge Instructions     Discharge Instructions    Diet - low sodium heart healthy   Complete by: As directed    Discharge instructions   Complete by: As directed    -Take 20 mg of Lasix by mouth daily.  You can take an additional 20 mg at night if you have greater than 3 pound weight gain in 2 days, shortness of breath or lower extremity swelling -Follow-up with palliative and hospice services at your living facility -Follow-up with your primary care physician in 1 to 2 weeks for lab work   Increase activity slowly   Complete by: As directed      Allergies as of 03/10/2020      Reactions   Penicillins Nausea And Vomiting, Anaphylaxis   Codeine Nausea And Vomiting   Vicodin [hydrocodone-acetaminophen] Nausea And Vomiting      Medication List    TAKE these medications   acetaminophen 650 MG CR tablet Commonly known as: TYLENOL Take 1,300 mg by mouth every 6 (six) hours as needed (back pain). max 6 tabs in 24 hrs   aspirin EC 81 MG tablet Take 81 mg by mouth daily.   Benefiber Powd Take 1 Scoop by mouth daily with supper. 1 Tablespoon   carvedilol 6.25 MG tablet Commonly known as: COREG Take 1 tablet (6.25 mg total) by mouth daily.   clopidogrel 75 MG tablet Commonly known as: PLAVIX Take 75 mg by mouth daily.   ENSURE ENLIVE PO Take 237 mLs by mouth 2 (two) times daily.   furosemide 20 MG tablet Commonly known as: LASIX Take 1 tablet (20 mg total) by mouth daily. Take an additional 20 mg tablet at night if short of breath or edema   ketotifen 0.025 % ophthalmic solution Commonly known as: ZADITOR Place 1 drop into both eyes 2 (two) times daily.   rosuvastatin 10 MG tablet Commonly known as: CRESTOR Take 1 tablet (10 mg total) by mouth daily. What changed: when to take this   SALONPAS PAIN RELIEF PATCH EX Apply 1 patch topically daily as needed  (pain).   senna 8.6 MG tablet Commonly known as: SENOKOT Take 2 tablets as  directed by mouth. At bedtime as needed for constipation. May have up to 2 times a week   SYSTANE OP Place 1 drop into both eyes 2 (two) times daily as needed (dry eyes).   traMADol 50 MG tablet Commonly known as: ULTRAM Take 50 mg by mouth every 12 (twelve) hours as needed (pain).   Vitamin D 50 MCG (2000 UT) tablet Take 2,000 Units by mouth daily.       Allergies  Allergen Reactions  . Penicillins Nausea And Vomiting and Anaphylaxis  . Codeine Nausea And Vomiting  . Vicodin [Hydrocodone-Acetaminophen] Nausea And Vomiting     Dispo: The patient is from: ALF              Anticipated d/c is to: ALF              Anticipated d/c date is: Today              Patient currently is medically stable to d/c.       Time coordinating discharge: Over 30 minutes   SIGNED:   Harold Hedge, D.O. [facility] Pager: [phone]  03/10/2020, 12:34 PM

## 2020-05-13 ENCOUNTER — Ambulatory Visit (INDEPENDENT_AMBULATORY_CARE_PROVIDER_SITE_OTHER): Payer: Medicare Other | Admitting: *Deleted

## 2020-05-13 DIAGNOSIS — I441 Atrioventricular block, second degree: Secondary | ICD-10-CM | POA: Diagnosis not present

## 2020-05-13 LAB — CUP PACEART REMOTE DEVICE CHECK
Battery Remaining Longevity: 106 mo
Battery Remaining Percentage: 89 %
Battery Voltage: 2.95 V
Brady Statistic AP VP Percent: 55 %
Brady Statistic AP VS Percent: 1 %
Brady Statistic AS VP Percent: 44 %
Brady Statistic AS VS Percent: 1 %
Brady Statistic RA Percent Paced: 1 %
Brady Statistic RV Percent Paced: 99 %
Date Time Interrogation Session: 20210817065117
Implantable Lead Implant Date: 20150818
Implantable Lead Implant Date: 20150818
Implantable Lead Location: 753859
Implantable Lead Location: 753860
Implantable Lead Model: 1948
Implantable Pulse Generator Implant Date: 20150818
Lead Channel Impedance Value: 360 Ohm
Lead Channel Impedance Value: 540 Ohm
Lead Channel Pacing Threshold Amplitude: 1 V
Lead Channel Pacing Threshold Amplitude: 1 V
Lead Channel Pacing Threshold Pulse Width: 0.5 ms
Lead Channel Pacing Threshold Pulse Width: 0.5 ms
Lead Channel Sensing Intrinsic Amplitude: 0.6 mV
Lead Channel Sensing Intrinsic Amplitude: 5.4 mV
Lead Channel Setting Pacing Amplitude: 1.25 V
Lead Channel Setting Pacing Amplitude: 2 V
Lead Channel Setting Pacing Pulse Width: 0.5 ms
Lead Channel Setting Sensing Sensitivity: 3 mV
Pulse Gen Model: 2240
Pulse Gen Serial Number: 7663995

## 2020-05-15 NOTE — Progress Notes (Signed)
Remote pacemaker transmission.   

## 2020-06-03 ENCOUNTER — Ambulatory Visit: Payer: Medicare Other | Admitting: Physician Assistant

## 2020-06-03 ENCOUNTER — Telehealth: Payer: Self-pay

## 2020-06-03 NOTE — Telephone Encounter (Signed)
[name] alert received 06/03/20 for persistent AF, previous alerts noted.Denies Pump Back. Patient is overdue to see Dr. Rayann Heman. Patient is currently in hospice. Spoke to patient who gave me permission to speak with Izora Gala, family sitting with patient. Advised that patient is over due with Dr. Rayann Heman. Izora Gala advised that they were not able to do virtual apts. With EP doc per [facility] nurse. Advised that we are able to do that with family if they agree on it with patient as well. Advised we are continuing to receive alerts and this would be a good time to discuss with Dr. Rayann Heman care plan for patient. Izora Gala is agreeable and will call back tomorrow to make apt. If family is in agreeable as well.

## 2020-06-04 NOTE — Telephone Encounter (Signed)
Called to follow-up. Per Izora Gala she spoke to patients daughter Judson Roch), and she would like to leave things as is for now. Izora Gala states since patient is in hospice they want to wait but if things change they will give [place] a call.   Forwarding to Dr. Rayann Heman for review and update.

## 2020-08-12 ENCOUNTER — Ambulatory Visit (INDEPENDENT_AMBULATORY_CARE_PROVIDER_SITE_OTHER): Payer: PRIVATE HEALTH INSURANCE

## 2020-08-12 DIAGNOSIS — I441 Atrioventricular block, second degree: Secondary | ICD-10-CM | POA: Diagnosis not present

## 2020-08-15 LAB — CUP PACEART REMOTE DEVICE CHECK
Battery Remaining Longevity: 86 mo
Battery Remaining Percentage: 72 %
Battery Voltage: 2.92 V
Brady Statistic AP VP Percent: 57 %
Brady Statistic AP VS Percent: 1 %
Brady Statistic AS VP Percent: 42 %
Brady Statistic AS VS Percent: 1 %
Brady Statistic RA Percent Paced: 1 %
Brady Statistic RV Percent Paced: 99 %
Date Time Interrogation Session: 20211118123659
Implantable Lead Implant Date: 20150818
Implantable Lead Implant Date: 20150818
Implantable Lead Location: 753859
Implantable Lead Location: 753860
Implantable Lead Model: 1948
Implantable Pulse Generator Implant Date: 20150818
Lead Channel Impedance Value: 350 Ohm
Lead Channel Impedance Value: 550 Ohm
Lead Channel Pacing Threshold Amplitude: 0.875 V
Lead Channel Pacing Threshold Amplitude: 1 V
Lead Channel Pacing Threshold Pulse Width: 0.5 ms
Lead Channel Pacing Threshold Pulse Width: 0.5 ms
Lead Channel Sensing Intrinsic Amplitude: 0.6 mV
Lead Channel Sensing Intrinsic Amplitude: 5.4 mV
Lead Channel Setting Pacing Amplitude: 1.125
Lead Channel Setting Pacing Amplitude: 2 V
Lead Channel Setting Pacing Pulse Width: 0.5 ms
Lead Channel Setting Sensing Sensitivity: 3 mV
Pulse Gen Model: 2240
Pulse Gen Serial Number: 7663995

## 2020-08-15 NOTE — Progress Notes (Signed)
Remote pacemaker transmission.   

## 2020-09-24 ENCOUNTER — Telehealth: Payer: Self-pay | Admitting: Internal Medicine

## 2020-09-24 NOTE — Telephone Encounter (Signed)
Spoke with pt daughter Huntley Dec, offered condolences.   Advised she can discard monitor if unable to return to office.

## 2020-09-24 NOTE — Telephone Encounter (Signed)
Pt daughter called in and stated that pt passed on 10-05-20.  She still has the monitor and needs a return kit mailed out   [place]  Best number  [phone]

## 2020-09-27 DEATH — deceased

## 2020-10-28 DEATH — deceased
